# Patient Record
Sex: Female | Born: 1960 | Race: White | Hispanic: No | Smoking: Never smoker
Health system: Southern US, Community
[De-identification: ages and names within clinical notes are randomized; demographics above are authoritative.]

## PROBLEM LIST (undated history)

## (undated) DIAGNOSIS — F419 Anxiety disorder, unspecified: Secondary | ICD-10-CM

## (undated) DIAGNOSIS — R011 Cardiac murmur, unspecified: Secondary | ICD-10-CM

## (undated) HISTORY — PX: OTHER SURGICAL HISTORY: SHX169

## (undated) HISTORY — DX: Anxiety disorder, unspecified: F41.9

## (undated) HISTORY — PX: ABDOMINAL HYSTERECTOMY: SHX81

## (undated) HISTORY — PX: EYE SURGERY: SHX253

---

## 1998-06-17 ENCOUNTER — Other Ambulatory Visit: Admission: RE | Admit: 1998-06-17 | Discharge: 1998-06-17 | Payer: Self-pay | Admitting: Obstetrics & Gynecology

## 1998-07-22 ENCOUNTER — Inpatient Hospital Stay (HOSPITAL_COMMUNITY): Admission: RE | Admit: 1998-07-22 | Discharge: 1998-07-23 | Payer: Self-pay | Admitting: Obstetrics & Gynecology

## 2000-07-10 ENCOUNTER — Emergency Department (HOSPITAL_COMMUNITY): Admission: EM | Admit: 2000-07-10 | Discharge: 2000-07-10 | Payer: Self-pay | Admitting: Emergency Medicine

## 2000-07-10 ENCOUNTER — Encounter: Payer: Self-pay | Admitting: Emergency Medicine

## 2000-12-01 ENCOUNTER — Ambulatory Visit (HOSPITAL_COMMUNITY): Admission: RE | Admit: 2000-12-01 | Discharge: 2000-12-01 | Payer: Self-pay | Admitting: Orthopedic Surgery

## 2000-12-01 ENCOUNTER — Encounter: Payer: Self-pay | Admitting: Orthopedic Surgery

## 2000-12-14 ENCOUNTER — Encounter: Admission: RE | Admit: 2000-12-14 | Discharge: 2001-01-10 | Payer: Self-pay | Admitting: Orthopedic Surgery

## 2001-08-20 ENCOUNTER — Emergency Department (HOSPITAL_COMMUNITY): Admission: EM | Admit: 2001-08-20 | Discharge: 2001-08-20 | Payer: Self-pay | Admitting: Internal Medicine

## 2001-08-20 ENCOUNTER — Encounter: Payer: Self-pay | Admitting: Internal Medicine

## 2005-01-14 ENCOUNTER — Ambulatory Visit: Payer: Self-pay | Admitting: Cardiology

## 2005-06-14 ENCOUNTER — Emergency Department (HOSPITAL_COMMUNITY): Admission: EM | Admit: 2005-06-14 | Discharge: 2005-06-14 | Payer: Self-pay | Admitting: *Deleted

## 2006-08-18 ENCOUNTER — Ambulatory Visit (HOSPITAL_COMMUNITY): Admission: RE | Admit: 2006-08-18 | Discharge: 2006-08-18 | Payer: Self-pay | Admitting: Family Medicine

## 2009-04-18 ENCOUNTER — Ambulatory Visit (HOSPITAL_COMMUNITY): Admission: RE | Admit: 2009-04-18 | Discharge: 2009-04-18 | Payer: Self-pay | Admitting: Family Medicine

## 2009-12-25 ENCOUNTER — Ambulatory Visit (HOSPITAL_COMMUNITY): Admission: RE | Admit: 2009-12-25 | Discharge: 2009-12-25 | Payer: Self-pay | Admitting: Family Medicine

## 2009-12-29 ENCOUNTER — Emergency Department (HOSPITAL_COMMUNITY): Admission: EM | Admit: 2009-12-29 | Discharge: 2009-12-29 | Payer: Self-pay | Admitting: Emergency Medicine

## 2011-06-08 ENCOUNTER — Encounter: Payer: Self-pay | Admitting: Cardiology

## 2011-06-08 ENCOUNTER — Encounter: Payer: Self-pay | Admitting: *Deleted

## 2011-06-08 ENCOUNTER — Ambulatory Visit (INDEPENDENT_AMBULATORY_CARE_PROVIDER_SITE_OTHER): Payer: Self-pay | Admitting: Cardiology

## 2011-06-08 DIAGNOSIS — R079 Chest pain, unspecified: Secondary | ICD-10-CM

## 2011-06-08 DIAGNOSIS — F419 Anxiety disorder, unspecified: Secondary | ICD-10-CM | POA: Insufficient documentation

## 2011-06-08 DIAGNOSIS — R000 Tachycardia, unspecified: Secondary | ICD-10-CM

## 2011-06-08 HISTORY — DX: Chest pain, unspecified: R07.9

## 2011-06-08 NOTE — Assessment & Plan Note (Signed)
This certainly might play a role in her chest pain. We discussed exercise and its role in primary prevention as well as treatment of anxiety. She will follow with her primary provider.

## 2011-06-08 NOTE — Assessment & Plan Note (Signed)
This is very atypical. She has no significant risk factors. At this point I think the pretest probability of obstructive coronary disease is extremely low. No further testing would be indicated. I might suggest GI evaluation if this were to continue.

## 2011-06-08 NOTE — Patient Instructions (Signed)
   Follow up as needed. Your physician recommends that you continue on your current medications as directed. Please refer to the Current Medication list given to you today. 
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
## 2015-03-05 ENCOUNTER — Encounter: Payer: Self-pay | Admitting: Physician Assistant

## 2015-03-25 ENCOUNTER — Encounter: Payer: Self-pay | Admitting: Physician Assistant

## 2015-04-30 ENCOUNTER — Ambulatory Visit: Payer: Self-pay | Admitting: Physical Therapy

## 2015-06-05 ENCOUNTER — Ambulatory Visit: Payer: 59 | Attending: Physician Assistant | Admitting: Physical Therapy

## 2015-06-05 DIAGNOSIS — M545 Low back pain, unspecified: Secondary | ICD-10-CM

## 2015-06-05 DIAGNOSIS — M533 Sacrococcygeal disorders, not elsewhere classified: Secondary | ICD-10-CM | POA: Diagnosis not present

## 2015-06-05 DIAGNOSIS — M2560 Stiffness of unspecified joint, not elsewhere classified: Secondary | ICD-10-CM | POA: Insufficient documentation

## 2015-06-05 DIAGNOSIS — M5386 Other specified dorsopathies, lumbar region: Secondary | ICD-10-CM
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
## 2015-06-11 ENCOUNTER — Ambulatory Visit: Payer: 59 | Admitting: Physical Therapy

## 2015-06-11 ENCOUNTER — Encounter: Payer: Self-pay | Admitting: Physical Therapy

## 2015-06-11 DIAGNOSIS — M545 Low back pain, unspecified: Secondary | ICD-10-CM

## 2015-06-11 DIAGNOSIS — M5386 Other specified dorsopathies, lumbar region: Secondary | ICD-10-CM

## 2015-06-11 DIAGNOSIS — M533 Sacrococcygeal disorders, not elsewhere classified: Secondary | ICD-10-CM

## 2015-06-11 NOTE — Therapy (Signed)
[facility] [address] [place], [place], [address] Phone: [phone]   Fax:  [phone]  Physical Therapy Treatment  Patient Details  Name: Ashley Marquez MRN: [id number] Date of Birth: 1961/04/04 Referring Provider:  Samuel JesterButler, Cynthia, DO  Encounter Date: 06/11/2015      PT End of Session - 06/11/15 1605    Visit Number 2   Number of Visits 18   Date for PT Re-Evaluation 07/31/15   PT Start Time 1320   PT Stop Time 1401   PT Time Calculation (min) 41 min   Activity Tolerance Patient limited by pain   Behavior During Therapy [facility] for tasks assessed/performed      Past Medical History  Diagnosis Date  . Anxiety     Past Surgical History  Procedure Laterality Date  . None      There were no vitals filed for this visit.  Visit Diagnosis:  Right-sided low back pain without sciatica  Sacral pain  Decreased range of motion of lumbar spine      Subjective Assessment - 06/11/15 1604    Subjective States that she is in a lot of pain today. Cannot hardly sleep due to the pain. Has an hour drive today for grandson's ballgame and riding in a car increases her pain and it begins to come into the anterior hip. Pain was so bad that by the time she got to PT facility today she had to sit and stand to reduce the stiffness that she reports is normal after she rides or sits for prolonged period.   Limitations Sitting   Patient Stated Goals Get out of pain.   Currently in Pain? Yes   Pain Score 8    Pain Location Back   Pain Orientation Right   Pain Onset More than a month ago            [facility] - 06/11/15 0001    Assessment   Medical Diagnosis Lumbar spine strain.   Onset Date/Surgical Date 01/18/15                     [facility] Treatment/Exercise - 06/11/15 0001    Modalities   Modalities Electrical Stimulation;Ultrasound   Programme researcher, broadcasting/film/videolectrical Stimulation   Electrical Stimulation Location R SI  Joint   Electrical Stimulation Action Pre-Mod   Electrical Stimulation Parameters 80-150 Hz x15 min   Electrical Stimulation Goals Pain   Ultrasound   Ultrasound Location R SI joint   Ultrasound Parameters 1.5 w/cm2, 100%, 1 mhz    Ultrasound Goals Pain   Manual Therapy   Manual Therapy Myofascial release   Myofascial Release STW/TPR to R SI joint region, lumbar paraspinals, superior glute to decrease pain and tightness.                     PT Long Term Goals - 06/11/15 1606    PT LONG TERM GOAL #1   Title Ind with an HEP.   Time 6   Period Weeks   Status On-going   PT LONG TERM GOAL #2   Title Sit 30 minutes with pain not > 3/10.   Time 6   Period Weeks   Status On-going   PT LONG TERM GOAL #3   Title Drive 72-5345-60 minutes with pain not > 3/10.   Time 6   Period Weeks   Status On-going   PT LONG TERM GOAL #4   Title Perform ADL's with pain  not > 3/10.   Time 6   Period Weeks   Status On-going               Plan - 06/11/15 1608    Clinical Impression Statement Patient did fairly well during today's treatment but had verbalized tenderness during manual therapy. Normal modalties response noted following removal of the modalities. Notable tightness and verbalized tenderness in R lumbar paraspinals into R superior glute during manual therapy. Trigger points noted in superior glute and R lumbar paraspinals. Patient reported tenderness and 10/10 pain following treatment.   Pt will benefit from skilled therapeutic intervention in order to improve on the following deficits Pain;Decreased activity tolerance   Rehab Potential Excellent   PT Frequency 3x / week   PT Duration 6 weeks   PT Treatment/Interventions ADLs/Self Care Home Management;Electrical Stimulation;Cryotherapy;Ultrasound;Therapeutic activities;Therapeutic exercise;Patient/family education;Manual techniques   PT Next Visit Plan Modalities to right SIJ region.  STW/M to right sacral iliac ligament.   Core exrecises.  Please check leg lengths. Ask MPT regarding moist heat with e-stim next treatment.   Consulted and Agree with Plan of Care Patient        Problem List Patient Active Problem List   Diagnosis Date Noted  . Chest pain 06/08/2011  . Anxiety 06/08/2011    Evelene Croon, PTA 06/11/2015, 4:15 PM  [facility] [address] [place], [place], [address] Phone: [phone]   Fax:  [phone]

## 2015-06-13 ENCOUNTER — Encounter: Payer: Self-pay | Admitting: Physical Therapy

## 2015-06-18 ENCOUNTER — Ambulatory Visit: Payer: 59 | Admitting: *Deleted

## 2015-06-18 ENCOUNTER — Encounter: Payer: Self-pay | Admitting: *Deleted

## 2015-06-18 DIAGNOSIS — M533 Sacrococcygeal disorders, not elsewhere classified: Secondary | ICD-10-CM

## 2015-06-18 DIAGNOSIS — M5386 Other specified dorsopathies, lumbar region: Secondary | ICD-10-CM

## 2015-06-18 DIAGNOSIS — M545 Low back pain, unspecified: Secondary | ICD-10-CM

## 2015-06-18 NOTE — Therapy (Signed)
[facility] [address] [place], [place], [address] Phone: [phone]   Fax:  [phone]  Physical Therapy Treatment  Patient Details  Name: Ashley Marquez MRN: [id number] Date of Birth: 1961-04-02 Referring Provider:  Samuel JesterButler, Cynthia, DO  Encounter Date: 06/18/2015      PT End of Session - 06/18/15 1516    Visit Number 3   Number of Visits 18   Date for PT Re-Evaluation 07/31/15   PT Start Time 1430   PT Stop Time 1517   PT Time Calculation (min) 47 min      Past Medical History  Diagnosis Date  . Anxiety     Past Surgical History  Procedure Laterality Date  . None      There were no vitals filed for this visit.  Visit Diagnosis:  Right-sided low back pain without sciatica  Sacral pain  Decreased range of motion of lumbar spine      Subjective Assessment - 06/18/15 1446    Subjective States that she is in a lot of pain today. Cannot hardly sleep due to the pain. Has an hour drive today for grandson's ballgame and riding in a car increases her pain and it begins to come into the anterior hip. Pain was so bad that by the time she got to PT facility today she had to sit and stand to reduce the stiffness that she reports is normal after she rides or sits for prolonged period.   Limitations Sitting   How long can you sit comfortably? 20 minutes+.   Patient Stated Goals Get out of pain.   Currently in Pain? Yes   Pain Score 8    Pain Location Back   Pain Orientation Right   Pain Descriptors / Indicators Aching;Constant   Pain Onset More than a month ago   Pain Frequency Constant   Aggravating Factors  sitting, standing   Pain Relieving Factors nothing                         [facility] Adult PT Treatment/Exercise - 06/18/15 0001    Exercises   Exercises Lumbar   Modalities   Modalities Electrical Stimulation;Ultrasound   Electrical Stimulation   Electrical Stimulation Location R SI Joint   Electrical Stimulation Action PRemod x15 mins to RT LB paras and SIJ with pt LT sidelying   Electrical Stimulation Parameters 80-150hz    Electrical Stimulation Goals Pain   Ultrasound   Ultrasound Location RT SIJ   Ultrasound Parameters  1.5 w/cm2 x 10 mins   Ultrasound Goals Pain   Manual Therapy   Manual Therapy Myofascial release   Myofascial Release STW/TPR and IASTM to R SI joint region, lumbar paraspinals, superior glute to decrease pain and tightness.                     PT Long Term Goals - 06/11/15 1606    PT LONG TERM GOAL #1   Title Ind with an HEP.   Time 6   Period Weeks   Status On-going   PT LONG TERM GOAL #2   Title Sit 30 minutes with pain not > 3/10.   Time 6   Period Weeks   Status On-going   PT LONG TERM GOAL #3   Title Drive 69-6245-60 minutes with pain not > 3/10.   Time 6   Period Weeks   Status On-going   PT LONG TERM GOAL #4   Title  Perform ADL's with pain not > 3/10.   Time 6   Period Weeks   Status On-going               Plan - 06/18/15 1517    Clinical Impression Statement Pt did fair with Rx today and had a little relief with STW and modalities. She still had notable tightness and tendernesin RT SIJ area and along QL RT side. Pt tolerated light to moderate pressure with STW. Current goals are ongoing   Pt will benefit from skilled therapeutic intervention in order to improve on the following deficits Pain;Decreased activity tolerance   Rehab Potential Excellent   PT Frequency 3x / week   PT Duration 6 weeks   PT Treatment/Interventions ADLs/Self Care Home Management;Electrical Stimulation;Cryotherapy;Ultrasound;Therapeutic activities;Therapeutic exercise;Patient/family education;Manual techniques   PT Next Visit Plan Modalities to right SIJ region.  STW/M to right sacral iliac ligament.  Core exrecises.  Please check leg lengths. Ask MPT regarding moist heat with e-stim next treatment. TRY CAT/CAMEL for spine mobility if pt can  tolerate QP position   Consulted and Agree with Plan of Care Patient        Problem List Patient Active Problem List   Diagnosis Date Noted  . Chest pain 06/08/2011  . Anxiety 06/08/2011    Meadow Abramo,CHRIS, PTA 06/18/2015, 3:39 PM  [facility] [address] [place], [place], [address] Phone: [phone]   Fax:  [phone]

## 2015-06-24 ENCOUNTER — Ambulatory Visit: Payer: 59 | Admitting: Physical Therapy

## 2015-06-24 ENCOUNTER — Encounter: Payer: Self-pay | Admitting: Physical Therapy

## 2015-06-24 DIAGNOSIS — M5386 Other specified dorsopathies, lumbar region: Secondary | ICD-10-CM

## 2015-06-24 DIAGNOSIS — M545 Low back pain, unspecified: Secondary | ICD-10-CM

## 2015-06-24 DIAGNOSIS — M533 Sacrococcygeal disorders, not elsewhere classified: Secondary | ICD-10-CM

## 2015-06-24 NOTE — Therapy (Signed)
[facility] [address] [place], [place], [address] Phone: [phone]   Fax:  [phone]  Physical Therapy Treatment  Patient Details  Name: Ashley Marquez MRN: [id number] Date of Birth: Jan 27, 1961 Referring Provider:  Samuel Jester, DO  Encounter Date: 06/24/2015      PT End of Session - 06/24/15 1527    Visit Number 4   Number of Visits 18   Date for PT Re-Evaluation 07/31/15   PT Start Time 1430   PT Stop Time 1520   PT Time Calculation (min) 50 min      Past Medical History  Diagnosis Date  . Anxiety     Past Surgical History  Procedure Laterality Date  . None      There were no vitals filed for this visit.  Visit Diagnosis:  Right-sided low back pain without sciatica  Sacral pain  Decreased range of motion of lumbar spine      Subjective Assessment - 06/24/15 1437    Subjective States that she had a bad weekend with increased pain. Has used ice and heat over the weekend with no relief. Also had daughter to try and massage her back but had increased pain. Stated that pain got so bad at work today that she left at 12:30 pm.   Limitations Sitting   How long can you sit comfortably? 20 minutes+.   Patient Stated Goals Get out of pain.   Currently in Pain? Yes   Pain Score 9    Pain Location Back   Pain Orientation Right;Lower   Pain Descriptors / Indicators Tightness   Pain Onset More than a month ago            [facility] PT Assessment - 06/24/15 0001    Assessment   Medical Diagnosis Lumbar spine strain.   Onset Date/Surgical Date 01/18/15   Next MD Visit 07/03/2015  Dr. Drue [facility] Adult PT Treatment/Exercise - 06/24/15 0001    Modalities   Modalities Electrical Stimulation;Moist Heat;Ultrasound   Moist Heat Therapy   Number Minutes Moist Heat 15 Minutes   Moist Heat Location Lumbar Spine   Electrical Stimulation   Electrical Stimulation Location B low back    Electrical Stimulation Action Pre-Mod   Electrical Stimulation Parameters 80-150 hz x15 min   Electrical Stimulation Goals Pain   Ultrasound   Ultrasound Location R SI joint in L sidelying   Ultrasound Parameters 1.5 w/cm2, 100%, 1 mhz   Ultrasound Goals Pain   Manual Therapy   Manual Therapy Myofascial release   Myofascial Release Gentle STW to R lumbar paraspinals, Quadratus Lumborum, R SI joint in L sidelying to decrease pain and tightness                     PT Long Term Goals - 06/11/15 1606    PT LONG TERM GOAL #1   Title Ind with an HEP.   Time 6   Period Weeks   Status On-going   PT LONG TERM GOAL #2   Title Sit 30 minutes with pain not > 3/10.   Time 6   Period Weeks   Status On-going   PT LONG TERM GOAL #3   Title Drive 13-24 minutes with pain not > 3/10.   Time 6   Period Weeks   Status On-going   PT LONG TERM GOAL #4  Title Perform ADL's with pain not > 3/10.   Time 6   Period Weeks   Status On-going               Plan - 06/24/15 1448    Clinical Impression Statement Patient tolerated treatment fairly well today. MPT was consulted regarding use of moist heat with electrical stimulation and he approved use of moist heat. Normal modalities response noted following removal of the modalties. Minimal to moderate tightness noted in R lumbar paraspinals, Quadratus Lumborum, and around R SI joint. STW completed gently due to patient's increased pain and experienced tenderness around the R SI joint. Goals continue to be considered on-going secondary to increased pain. Experienced 6/10 pain following treatment.   Pt will benefit from skilled therapeutic intervention in order to improve on the following deficits Pain;Decreased activity tolerance   Rehab Potential Excellent   PT Frequency 3x / week   PT Duration 6 weeks   PT Treatment/Interventions ADLs/Self Care Home Management;Electrical Stimulation;Cryotherapy;Ultrasound;Therapeutic  activities;Therapeutic exercise;Patient/family education;Manual techniques   PT Next Visit Plan Modalities to right SIJ region.  STW/M to right sacral iliac ligament.  Core exrecises.  Please check leg lengths. Ask MPT regarding moist heat with e-stim next treatment. TRY CAT/CAMEL for spine mobility if pt can tolerate QP position. Communicate with MPT regarding lumbar traction.   Consulted and Agree with Plan of Care Patient        Problem List Patient Active Problem List   Diagnosis Date Noted  . Chest pain 06/08/2011  . Anxiety 06/08/2011   Florence Canner, PTA 06/24/2015 3:31 PM  [facility] [address] [place], [place], [address] Phone: [phone]   Fax:  [phone]

## 2015-07-01 ENCOUNTER — Encounter: Payer: Self-pay | Admitting: Physical Therapy

## 2015-07-01 ENCOUNTER — Ambulatory Visit: Payer: 59 | Attending: Physician Assistant | Admitting: Physical Therapy

## 2015-07-01 DIAGNOSIS — M533 Sacrococcygeal disorders, not elsewhere classified: Secondary | ICD-10-CM

## 2015-07-01 DIAGNOSIS — M545 Low back pain, unspecified: Secondary | ICD-10-CM

## 2015-07-01 DIAGNOSIS — M2560 Stiffness of unspecified joint, not elsewhere classified: Secondary | ICD-10-CM | POA: Diagnosis present

## 2015-07-01 DIAGNOSIS — M5386 Other specified dorsopathies, lumbar region: Secondary | ICD-10-CM

## 2015-07-01 NOTE — Therapy (Addendum)
[facility] [place], [place], [address] Phone: [phone]   Fax:  [phone]  Physical Therapy Treatment  Patient Details  Name: Ashley Marquez MRN: [id number] Date of Birth: 05-Jan-1961 Referring Provider:  Octavio Graves, DO  Encounter Date: 07/01/2015      PT End of Session - 07/01/15 1439    Visit Number 5   Number of Visits 18   Date for PT Re-Evaluation 07/31/15   PT Start Time 1435   PT Stop Time 4403   PT Time Calculation (min) 43 min   Activity Tolerance Patient limited by pain;Patient tolerated treatment well   Behavior During Therapy [facility] for tasks assessed/performed      Past Medical History  Diagnosis Date  . Anxiety     Past Surgical History  Procedure Laterality Date  . None      There were no vitals filed for this visit.  Visit Diagnosis:  Right-sided low back pain without sciatica  Sacral pain  Decreased range of motion of lumbar spine      Subjective Assessment - 07/01/15 1438    Subjective Reports that she has increased pain today. Has been sick this weekend and does not know if the increase in pain is from laying down over the weekend. Sees MD 07/03/2015. Has a beach trip coming up next week.   Limitations Sitting   How long can you sit comfortably? 20 minutes+.   Patient Stated Goals Get out of pain.   Currently in Pain? Yes   Pain Score 8    Pain Location Back   Pain Orientation Left;Lower   Pain Onset More than a month ago                                      PT Long Term Goals - 06/11/15 1606    PT LONG TERM GOAL #1   Title Ind with an HEP.   Time 6   Period Weeks   Status On-going   PT LONG TERM GOAL #2   Title Sit 30 minutes with pain not > 3/10.   Time 6   Period Weeks   Status On-going   PT LONG TERM GOAL #3   Title Drive 47-42 minutes with pain not > 3/10.   Time 6   Period Weeks   Status On-going   PT LONG TERM GOAL #4   Title  Perform ADL's with pain not > 3/10.   Time 6   Period Weeks   Status On-going               Plan - 07/01/15 1607    Clinical Impression Statement Patient tolerated treatment well today with some pain and tenderness experienced over the R SI joint region. Slightly moderate tightness noted in the R lumbar paraspinals, Quadratus Lumborum region during manual therapy with moderate pressure tolerated except over the R SI joint. Normal modalties response noted following removal of the modalities. No goals have been achieved at this time secondary to increased pain experienced by patient.   Pt will benefit from skilled therapeutic intervention in order to improve on the following deficits Pain;Decreased activity tolerance   Rehab Potential Excellent   PT Frequency 3x / week   PT Duration 6 weeks   PT Treatment/Interventions ADLs/Self Care Home Management;Electrical Stimulation;Cryotherapy;Ultrasound;Therapeutic activities;Therapeutic exercise;Patient/family education;Manual techniques   PT Next Visit Plan Modalities to right SIJ region.  STW/M to right sacral iliac ligament.  Core exrecises.  Please check leg lengths. TRY CAT/CAMEL for spine mobility if pt can tolerate QP position. Communicate with MPT regarding lumbar traction.   Consulted and Agree with Plan of Care Patient        Problem List Patient Active Problem List   Diagnosis Date Noted  . Chest pain 06/08/2011  . Anxiety 06/08/2011   Ahmed Prima, PTA 07/02/2015 10:27 AM  No notes on file  PHYSICAL THERAPY DISCHARGE SUMMARY  Visits from Start of Care: 5.  Current functional level related to goals / functional outcomes: See above.   Remaining deficits: Continued pain.   Education / Equipment: HEP. Plan: Patient agrees to discharge.  Patient goals were not met. Patient is being discharged due to                                                     ?????      Mali Applegate MPT  [facility] [place], [place], [address] Phone: [phone]   Fax:  [phone]

## 2015-12-05 ENCOUNTER — Emergency Department (HOSPITAL_COMMUNITY): Payer: BLUE CROSS/BLUE SHIELD

## 2015-12-05 ENCOUNTER — Encounter (HOSPITAL_COMMUNITY): Payer: Self-pay

## 2015-12-05 ENCOUNTER — Emergency Department (HOSPITAL_COMMUNITY)
Admission: EM | Admit: 2015-12-05 | Discharge: 2015-12-05 | Disposition: A | Discharge status: Home / Self Care | Payer: BLUE CROSS/BLUE SHIELD | Attending: Emergency Medicine | Admitting: Emergency Medicine

## 2015-12-05 DIAGNOSIS — R6883 Chills (without fever): Secondary | ICD-10-CM | POA: Diagnosis not present

## 2015-12-05 DIAGNOSIS — Z880 Allergy status to penicillin: Secondary | ICD-10-CM | POA: Insufficient documentation

## 2015-12-05 DIAGNOSIS — Z8659 Personal history of other mental and behavioral disorders: Secondary | ICD-10-CM | POA: Insufficient documentation

## 2015-12-05 DIAGNOSIS — R079 Chest pain, unspecified: Secondary | ICD-10-CM | POA: Diagnosis present

## 2015-12-05 DIAGNOSIS — R0789 Other chest pain: Secondary | ICD-10-CM | POA: Insufficient documentation

## 2015-12-05 DIAGNOSIS — R011 Cardiac murmur, unspecified: Secondary | ICD-10-CM | POA: Diagnosis not present

## 2015-12-05 DIAGNOSIS — R110 Nausea: Secondary | ICD-10-CM | POA: Diagnosis not present

## 2015-12-05 HISTORY — DX: Cardiac murmur, unspecified: R01.1

## 2015-12-05 LAB — CBC WITH DIFFERENTIAL/PLATELET
Basophils Absolute: 0 10*3/uL (ref 0.0–0.1)
Basophils Relative: 0 %
Eosinophils Absolute: 0.1 10*3/uL (ref 0.0–0.7)
Eosinophils Relative: 1 %
HCT: 37.3 % (ref 36.0–46.0)
Hemoglobin: 12.5 g/dL (ref 12.0–15.0)
Lymphocytes Relative: 20 %
Lymphs Abs: 1.2 10*3/uL (ref 0.7–4.0)
MCH: 31.1 pg (ref 26.0–34.0)
MCHC: 33.5 g/dL (ref 30.0–36.0)
MCV: 92.8 fL (ref 78.0–100.0)
Monocytes Absolute: 0.3 10*3/uL (ref 0.1–1.0)
Monocytes Relative: 6 %
Neutro Abs: 4.3 10*3/uL (ref 1.7–7.7)
Neutrophils Relative %: 73 %
Platelets: 306 10*3/uL (ref 150–400)
RBC: 4.02 MIL/uL (ref 3.87–5.11)
RDW: 12.4 % (ref 11.5–15.5)
WBC: 5.8 10*3/uL (ref 4.0–10.5)

## 2015-12-05 LAB — BASIC METABOLIC PANEL
Anion gap: 8 (ref 5–15)
BUN: 17 mg/dL (ref 6–20)
CO2: 25 mmol/L (ref 22–32)
Calcium: 9.2 mg/dL (ref 8.9–10.3)
Chloride: 108 mmol/L (ref 101–111)
Creatinine, Ser: 0.74 mg/dL (ref 0.44–1.00)
GFR calc Af Amer: >60 mL/min (ref >60)
GFR calc non Af Amer: >60 mL/min (ref >60)
Glucose, Bld: 106 mg/dL — ABNORMAL HIGH (ref 65–99)
Potassium: 3.9 mmol/L (ref 3.5–5.1)
Sodium: 141 mmol/L (ref 135–145)

## 2015-12-05 LAB — TROPONIN I
Troponin I: <0.03 ng/mL (ref <0.031)
Troponin I: <0.03 ng/mL (ref <0.031)

## 2015-12-05 NOTE — ED Provider Notes (Signed)
CSN: [id number]     Arrival date & time 12/05/15  0654 History   First MD Initiated Contact with Patient 12/05/15 0700     Chief Complaint  Patient presents with  . Chest Pain     (Consider location/radiation/quality/duration/timing/severity/associated sxs/prior Treatment) HPI Patient awakened today at 4 AM, feeling well. At 5 AM she developed sharp pain at her right scapula. Pain was not made better or worse by anything. Symptoms were coming by chills. No cough and no other associated symptoms. Pain and suddenly at 5:50 AM when suddenly it radiated around to her anterior chest and ended with "an electric shock", at her anterior chest, lasting a split second. Patient also reports intermittent nausea for the past 3 or 4 days. She is presently asymptomatic. She treated herself with ibuprofen. She called EMS and EMS advised her to take 4 baby aspirins which she did She's never had chest pain with exertion. Past Medical History  Diagnosis Date  . Anxiety   . Heart murmur     Past Surgical History  Procedure Laterality Date  . None    . Abdominal hysterectomy     Family History  Problem Relation Age of Onset  . Emphysema Father 3874   Social History  Substance Use Topics  . Smoking status: Never Smoker   . Smokeless tobacco: Never Used  . Alcohol Use: None   no illicit drug use OB History    No data available     Review of Systems  Constitutional: Positive for chills.  HENT: Negative.   Respiratory: Negative.   Cardiovascular: Positive for chest pain.  Gastrointestinal: Positive for nausea.  Musculoskeletal: Negative.   Skin: Negative.   Neurological: Negative.   Psychiatric/Behavioral: Negative.   All other systems reviewed and are negative.     Allergies  Penicillins  Home Medications   Prior to Admission medications   Medication Sig Start Date End Date Taking? Authorizing Provider  ibuprofen (ADVIL,MOTRIN) 200 MG tablet Take 200 mg by mouth every 4 (four) hours as  needed for moderate pain.    Historical Provider, MD   BP 152/85 mmHg  Pulse 87  Temp(Src) 98.2 F (36.8 C) (Oral)  Resp 20  Ht 5\' 4"  (1.626 m)  Wt 132 lb (59.875 kg)  BMI 22.65 kg/m2  SpO2 100% Physical Exam  Constitutional: She appears well-developed and well-nourished.  HENT:  Head: Normocephalic and atraumatic.  Eyes: Conjunctivae are normal. Pupils are equal, round, and reactive to light.  Neck: Neck supple. No tracheal deviation present. No thyromegaly present.  Cardiovascular: Normal rate and regular rhythm.   No murmur heard. Pulmonary/Chest: Effort normal and breath sounds normal.  Abdominal: Soft. Bowel sounds are normal. She exhibits no distension. There is no tenderness.  Musculoskeletal: Normal range of motion. She exhibits no edema or tenderness.  Neurological: She is alert. Coordination normal.  Skin: Skin is warm and dry. No rash noted.  Psychiatric: She has a normal mood and affect.  Nursing note and vitals reviewed.   ED Course  Procedures (including critical care time) Labs Review Labs Reviewed - No data to display  Imaging Review No results found. I have personally reviewed and evaluated these images and lab results as part of my medical decision-making.   EKG Interpretation None     ED ECG REPORT   Date: 12/05/2015  Rate: 90  Rhythm: normal sinus rhythm  QRS Axis: normal  Intervals: normal  ST/T Wave abnormalities: nonspecific T wave changes  Conduction Disutrbances:none  Narrative  Interpretation:   Old EKG Reviewed: No significant change over 07/10/2000  I have personally reviewed the EKG tracing and agree with the computerized printout as noted. 10:45 AM patient asymptomatic Chest x-ray viewed by me Results for orders placed or performed during the hospital encounter of 12/05/15  Troponin I  Result Value Ref Range   Troponin I <0.03 <0.031 ng/mL  Troponin I  Result Value Ref Range   Troponin I <0.03 <0.031 ng/mL  Basic metabolic  panel  Result Value Ref Range   Sodium 141 135 - 145 mmol/L   Potassium 3.9 3.5 - 5.1 mmol/L   Chloride 108 101 - 111 mmol/L   CO2 25 22 - 32 mmol/L   Glucose, Bld 106 (H) 65 - 99 mg/dL   BUN 17 6 - 20 mg/dL   Creatinine, Ser 9.14 0.44 - 1.00 mg/dL   Calcium 9.2 8.9 - 78.2 mg/dL   GFR calc non Af Amer >60 >60 mL/min   GFR calc Af Amer >60 >60 mL/min   Anion gap 8 5 - 15  CBC with Differential/Platelet  Result Value Ref Range   WBC 5.8 4.0 - 10.5 K/uL   RBC 4.02 3.87 - 5.11 MIL/uL   Hemoglobin 12.5 12.0 - 15.0 g/dL   HCT 95.6 21.3 - 08.6 %   MCV 92.8 78.0 - 100.0 fL   MCH 31.1 26.0 - 34.0 pg   MCHC 33.5 30.0 - 36.0 g/dL   RDW 57.8 46.9 - 62.9 %   Platelets 306 150 - 400 K/uL   Neutrophils Relative % 73 %   Neutro Abs 4.3 1.7 - 7.7 K/uL   Lymphocytes Relative 20 %   Lymphs Abs 1.2 0.7 - 4.0 K/uL   Monocytes Relative 6 %   Monocytes Absolute 0.3 0.1 - 1.0 K/uL   Eosinophils Relative 1 %   Eosinophils Absolute 0.1 0.0 - 0.7 K/uL   Basophils Relative 0 %   Basophils Absolute 0.0 0.0 - 0.1 K/uL   Dg Chest 2 View  12/05/2015  CLINICAL DATA:  Chest pain EXAM: CHEST  2 VIEW COMPARISON:  12/29/2009 FINDINGS: The heart size and mediastinal contours are within normal limits. Both lungs are clear. The visualized skeletal structures are unremarkable. Apical scarring bilaterally unchanged. IMPRESSION: No active cardiopulmonary disease. Electronically Signed   By: Marlan Palau M.D.   On: 12/05/2015 07:54    MDM  Heart score =2 based on age, ekg criteria.cardiac risk factors -none patient suitable for outpatient cardiac evaluation Diagnosis atypical chest pain Final diagnoses:  None   plan follow-up with PMD       Doug Sou, MD 12/05/15 1129

## 2015-12-05 NOTE — ED Notes (Addendum)
Pt reports woke up at 4am and felt fine.  Reports got upset with her grandson this morning because he didn't want to get up.  Pt says after getting upset she started having pain in r shoulder blade.  Pt says while driving, had a sharp pain in chest, cold chills, and nauseated.  Pt took 2 ibuprofen and called ems.  EMS came to pt's house and advised her to take 4 baby asa.

## 2015-12-05 NOTE — ED Notes (Signed)
Dr. J at bedside 

## 2015-12-05 NOTE — Discharge Instructions (Signed)
Nonspecific Chest Pain Call your primary care physician today to schedule follow-up appointment for within the next few weeks. You can discuss with your primary care physician the possibility of an exercise stress test to check at your heart further as an outpatient. Return if your condition worsens for any reason. It is often hard to find the cause of chest pain. There is always a chance that your pain could be related to something serious, such as a heart attack or a blood clot in your lungs. Chest pain can also be caused by conditions that are not life-threatening. If you have chest pain, it is very important to follow up with your doctor.  HOME CARE  If you were prescribed an antibiotic medicine, finish it all even if you start to feel better.  Avoid any activities that cause chest pain.  Do not use any tobacco products, including cigarettes, chewing tobacco, or electronic cigarettes. If you need help quitting, ask your doctor.  Do not drink alcohol.  Take medicines only as told by your doctor.  Keep all follow-up visits as told by your doctor. This is important. This includes any further testing if your chest pain does not go away.  Your doctor may tell you to keep your head raised (elevated) while you sleep.  Make lifestyle changes as told by your doctor. These may include:  Getting regular exercise. Ask your doctor to suggest some activities that are safe for you.  Eating a heart-healthy diet. Your doctor or a diet specialist (dietitian) can help you to learn healthy eating options.  Maintaining a healthy weight.  Managing diabetes, if necessary.  Reducing stress. GET HELP IF:  Your chest pain does not go away, even after treatment.  You have a rash with blisters on your chest.  You have a fever. GET HELP RIGHT AWAY IF:  Your chest pain is worse.  You have an increasing cough, or you cough up blood.  You have severe belly (abdominal) pain.  You feel extremely  weak.  You pass out (faint).  You have chills.  You have sudden, unexplained chest discomfort.  You have sudden, unexplained discomfort in your arms, back, neck, or jaw.  You have shortness of breath at any time.  You suddenly start to sweat, or your skin gets clammy.  You feel nauseous.  You vomit.  You suddenly feel light-headed or dizzy.  Your heart begins to beat quickly, or it feels like it is skipping beats. These symptoms may be an emergency. Do not wait to see if the symptoms will go away. Get medical help right away. Call your local emergency services (911 in the [place] Do not drive yourself to the hospital.   This information is not intended to replace advice given to you by your health care provider. Make sure you discuss any questions you have with your health care provider.   Document Released: 05/04/2008 Document Revised: 12/07/2014 Document Reviewed: 06/22/2014 Elsevier Interactive Patient Education [employer]2016 Elsevier Inc.

## 2015-12-05 NOTE — ED Notes (Signed)
Pt made aware to return if symptoms worsen or if any life threatening symptoms occur.   

## 2016-09-15 ENCOUNTER — Encounter: Payer: Self-pay | Admitting: Physician Assistant

## 2016-09-15 ENCOUNTER — Ambulatory Visit (INDEPENDENT_AMBULATORY_CARE_PROVIDER_SITE_OTHER): Payer: BLUE CROSS/BLUE SHIELD | Admitting: Physician Assistant

## 2016-09-15 VITALS — BP 133/86 | HR 62 | Temp 97.3°F | Ht 64.0 in | Wt 131.2 lb

## 2016-09-15 DIAGNOSIS — R030 Elevated blood-pressure reading, without diagnosis of hypertension: Secondary | ICD-10-CM | POA: Diagnosis not present

## 2016-09-15 DIAGNOSIS — F411 Generalized anxiety disorder: Secondary | ICD-10-CM | POA: Insufficient documentation

## 2016-09-15 DIAGNOSIS — G8929 Other chronic pain: Secondary | ICD-10-CM | POA: Diagnosis not present

## 2016-09-15 DIAGNOSIS — M545 Low back pain: Secondary | ICD-10-CM

## 2016-09-15 DIAGNOSIS — F419 Anxiety disorder, unspecified: Secondary | ICD-10-CM | POA: Diagnosis not present

## 2016-09-15 MED ORDER — CITALOPRAM HYDROBROMIDE 20 MG PO TABS: 20.0000 mg | ORAL_TABLET | Freq: Every day | ORAL | 5 refills | Status: DC

## 2016-09-15 MED ORDER — OXYCODONE-ACETAMINOPHEN 10-325 MG PO TABS: 1.0000 | ORAL_TABLET | Freq: Four times a day (QID) | ORAL | 0 refills | Status: DC | PRN

## 2016-09-15 MED ORDER — LISINOPRIL 5 MG PO TABS: 5.0000 mg | ORAL_TABLET | Freq: Every day | ORAL | 3 refills | Status: DC

## 2016-09-15 MED ORDER — MELOXICAM 15 MG PO TABS: 15.0000 mg | ORAL_TABLET | Freq: Every day | ORAL | 0 refills | Status: DC

## 2016-09-15 MED ORDER — GABAPENTIN 100 MG PO CAPS: 100.0000 mg | ORAL_CAPSULE | Freq: Three times a day (TID) | ORAL | 3 refills | Status: DC
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
## 2016-09-15 NOTE — Patient Instructions (Signed)
Hypertension Hypertension, commonly called high blood pressure, is when the force of blood pumping through your arteries is too strong. Your arteries are the blood vessels that carry blood from your heart throughout your body. A blood pressure reading consists of a higher number over a lower number, such as 110/72. The higher number (systolic) is the pressure inside your arteries when your heart pumps. The lower number (diastolic) is the pressure inside your arteries when your heart relaxes. Ideally you want your blood pressure below 120/80. Hypertension forces your heart to work harder to pump blood. Your arteries may become narrow or stiff. Having untreated or uncontrolled hypertension can cause heart attack, stroke, kidney disease, and other problems. RISK FACTORS Some risk factors for high blood pressure are controllable. Others are not.  Risk factors you cannot control include:   Race. You may be at higher risk if you are African American.  Age. Risk increases with age.  Gender. Men are at higher risk than women before age 45 years. After age 65, women are at higher risk than men. Risk factors you can control include:  Not getting enough exercise or physical activity.  Being overweight.  Getting too much fat, sugar, calories, or salt in your diet.  Drinking too much alcohol. SIGNS AND SYMPTOMS Hypertension does not usually cause signs or symptoms. Extremely high blood pressure (hypertensive crisis) may cause headache, anxiety, shortness of breath, and nosebleed. DIAGNOSIS To check if you have hypertension, your health care provider will measure your blood pressure while you are seated, with your arm held at the level of your heart. It should be measured at least twice using the same arm. Certain conditions can cause a difference in blood pressure between your right and left arms. A blood pressure reading that is higher than normal on one occasion does not mean that you need treatment. If  it is not clear whether you have high blood pressure, you may be asked to return on a different day to have your blood pressure checked again. Or, you may be asked to monitor your blood pressure at home for 1 or more weeks. TREATMENT Treating high blood pressure includes making lifestyle changes and possibly taking medicine. Living a healthy lifestyle can help lower high blood pressure. You may need to change some of your habits. Lifestyle changes may include:  Following the DASH diet. This diet is high in fruits, vegetables, and whole grains. It is low in salt, red meat, and added sugars.  Keep your sodium intake below 2,300 mg per day.  Getting at least 30-45 minutes of aerobic exercise at least 4 times per week.  Losing weight if necessary.  Not smoking.  Limiting alcoholic beverages.  Learning ways to reduce stress. Your health care provider may prescribe medicine if lifestyle changes are not enough to get your blood pressure under control, and if one of the following is true:  You are 18-59 years of age and your systolic blood pressure is above 140.  You are 60 years of age or older, and your systolic blood pressure is above 150.  Your diastolic blood pressure is above 90.  You have diabetes, and your systolic blood pressure is over 140 or your diastolic blood pressure is over 90.  You have kidney disease and your blood pressure is above 140/90.  You have heart disease and your blood pressure is above 140/90. Your personal target blood pressure may vary depending on your medical conditions, your age, and other factors. HOME CARE INSTRUCTIONS    Have your blood pressure rechecked as directed by your health care provider.   Take medicines only as directed by your health care provider. Follow the directions carefully. Blood pressure medicines must be taken as prescribed. The medicine does not work as well when you skip doses. Skipping doses also puts you at risk for  problems.  Do not smoke.   Monitor your blood pressure at home as directed by your health care provider. SEEK MEDICAL CARE IF:   You think you are having a reaction to medicines taken.  You have recurrent headaches or feel dizzy.  You have swelling in your ankles.  You have trouble with your vision. SEEK IMMEDIATE MEDICAL CARE IF:  You develop a severe headache or confusion.  You have unusual weakness, numbness, or feel faint.  You have severe chest or abdominal pain.  You vomit repeatedly.  You have trouble breathing. MAKE SURE YOU:   Understand these instructions.  Will watch your condition.  Will get help right away if you are not doing well or get worse.   This information is not intended to replace advice given to you by your health care provider. Make sure you discuss any questions you have with your health care provider.   Document Released: 11/16/2005 Document Revised: 04/02/2015 Document Reviewed: 09/08/2013 Elsevier Interactive Patient Education 2016 [employer] Inc.  

## 2016-10-13 ENCOUNTER — Ambulatory Visit (INDEPENDENT_AMBULATORY_CARE_PROVIDER_SITE_OTHER): Payer: BLUE CROSS/BLUE SHIELD | Admitting: Physician Assistant

## 2016-10-13 VITALS — BP 133/78 | HR 68 | Temp 96.6°F | Ht 64.0 in | Wt 133.8 lb

## 2016-10-13 DIAGNOSIS — F419 Anxiety disorder, unspecified: Secondary | ICD-10-CM

## 2016-10-13 DIAGNOSIS — M545 Low back pain, unspecified: Secondary | ICD-10-CM

## 2016-10-13 DIAGNOSIS — G8929 Other chronic pain: Secondary | ICD-10-CM

## 2016-10-13 DIAGNOSIS — F411 Generalized anxiety disorder: Secondary | ICD-10-CM | POA: Diagnosis not present

## 2016-10-13 DIAGNOSIS — J0100 Acute maxillary sinusitis, unspecified: Secondary | ICD-10-CM | POA: Diagnosis not present

## 2016-10-13 DIAGNOSIS — R030 Elevated blood-pressure reading, without diagnosis of hypertension: Secondary | ICD-10-CM

## 2016-10-13 MED ORDER — OXYCODONE-ACETAMINOPHEN 10-325 MG PO TABS: 1.0000 | ORAL_TABLET | ORAL | 0 refills | Status: DC | PRN

## 2016-10-13 MED ORDER — OXYCODONE-ACETAMINOPHEN 10-325 MG PO TABS: 1.0000 | ORAL_TABLET | Freq: Four times a day (QID) | ORAL | 0 refills | Status: DC | PRN

## 2016-10-13 MED ORDER — PREDNISONE 10 MG (21) PO TBPK: ORAL_TABLET | ORAL | 0 refills | Status: DC

## 2016-10-13 MED ORDER — AZITHROMYCIN 250 MG PO TABS: ORAL_TABLET | ORAL | 0 refills | Status: DC

## 2016-10-13 NOTE — Progress Notes (Signed)
BP 133/78   Pulse 68   Temp (!) 96.6 F (35.9 C) (Oral)   Ht 5\' 4"  (1.626 m)   Wt 133 lb 12.8 oz (60.7 kg)   BMI 22.97 kg/m    Subjective:    Patient ID: Ashley Marquez, female    DOB: Sep 07, 1961, 55 y.o.   MRN: [id number]  HPI: Ashley Marquez is a 55 y.o. female presenting on 10/13/2016 for Hypertension (4 week rck ); Ear Pain; and head congestion  Blood pressure has been better and will need refills. Has also had cough and congestion for several days.  Denies fever and chills.  No NVD. Sinus pressure and post nasal drainage. Cough is severe at night. Pain visit is also updated. Record is below note.  Relevant past medical, surgical, family and social history reviewed and updated as indicated. Allergies and medications reviewed and updated.  Past Medical History:  Diagnosis Date  . Anxiety   . Heart murmur     Past Surgical History:  Procedure Laterality Date  . ABDOMINAL HYSTERECTOMY    . None      Review of Systems  Constitutional: Negative.   HENT: Positive for congestion, postnasal drip and sore throat.   Eyes: Negative.   Respiratory: Positive for cough and wheezing.   Gastrointestinal: Negative.   Genitourinary: Negative.   Musculoskeletal: Positive for arthralgias, back pain, gait problem and myalgias.      Medication List       Accurate as of 10/13/16 11:59 PM. Always use your most recent med list.          azithromycin 250 MG tablet Commonly known as:  ZITHROMAX Z-PAK As directed   citalopram 20 MG tablet Commonly known as:  CELEXA Take 1 tablet (20 mg total) by mouth daily.   ibuprofen 200 MG tablet Commonly known as:  ADVIL,MOTRIN Take 400 mg by mouth every 4 (four) hours as needed for moderate pain.   lisinopril 5 MG tablet Commonly known as:  PRINIVIL,ZESTRIL Take 1 tablet (5 mg total) by mouth daily.   oxyCODONE-acetaminophen 10-325 MG tablet Commonly known as:  PERCOCET Take 1 tablet by mouth every 6 (six) hours as needed.     oxyCODONE-acetaminophen 10-325 MG tablet Commonly known as:  PERCOCET Take 1 tablet by mouth every 4 (four) hours as needed for pain.   oxyCODONE-acetaminophen 10-325 MG tablet Commonly known as:  PERCOCET Take 1 tablet by mouth every 4 (four) hours as needed for pain.   predniSONE 10 MG (21) Tbpk tablet Commonly known as:  STERAPRED UNI-PAK 21 TAB As directed x 6 days          Objective:    BP 133/78   Pulse 68   Temp (!) 96.6 F (35.9 C) (Oral)   Ht 5\' 4"  (1.626 m)   Wt 133 lb 12.8 oz (60.7 kg)   BMI 22.97 kg/m   Allergies  Allergen Reactions  . Penicillins Nausea And Vomiting and Other (See Comments)    Patient slept for 2 days.  Has patient had a PCN reaction causing immediate rash, facial/tongue/throat swelling, SOB or lightheadedness with hypotension: No Has patient had a PCN reaction causing severe rash involving mucus membranes or skin necrosis: No Has patient had a PCN reaction that required hospitalization No Has patient had a PCN reaction occurring within the last 10 years: No If all of the above answers are "NO", then may proceed with Cephalosporin use.     Physical Exam  Constitutional: She is  oriented to person, place, and time. She appears well-developed and well-nourished.  HENT:  Head: Normocephalic and atraumatic.  Right Ear: Tympanic membrane and external ear normal. No middle ear effusion.  Left Ear: Tympanic membrane and external ear normal.  No middle ear effusion.  Nose: Mucosal edema and rhinorrhea present. Right sinus exhibits no maxillary sinus tenderness. Left sinus exhibits no maxillary sinus tenderness.  Mouth/Throat: Uvula is midline. Posterior oropharyngeal erythema present.  Eyes: Conjunctivae and EOM are normal. Pupils are equal, round, and reactive to light. Right eye exhibits no discharge. Left eye exhibits no discharge.  Neck: Normal range of motion.  Cardiovascular: Normal rate, regular rhythm, normal heart sounds and intact  distal pulses.   Pulmonary/Chest: Effort normal and breath sounds normal. No respiratory distress. She has no wheezes.  Abdominal: Soft. Bowel sounds are normal.  Musculoskeletal:       Lumbar back: She exhibits decreased range of motion, tenderness, pain and spasm.  Lymphadenopathy:    She has no cervical adenopathy.  Neurological: She is alert and oriented to person, place, and time. She has normal reflexes.  Skin: Skin is warm and dry. No rash noted.  Psychiatric: She has a normal mood and affect. Her behavior is normal. Judgment and thought content normal.  Nursing note and vitals reviewed.       Assessment & Plan:   1. Elevated BP without diagnosis of hypertension  2. Generalized anxiety disorder  3. Anxiety  4. Acute non-recurrent maxillary sinusitis - azithromycin (ZITHROMAX Z-PAK) 250 MG tablet; As directed  Dispense: 6 tablet; Refill: 0  5. Chronic right-sided low back pain without sciatica - oxyCODONE-acetaminophen (PERCOCET) 10-325 MG tablet; Take 1 tablet by mouth every 6 (six) hours as needed.  Dispense: 150 tablet; Refill: 0 - oxyCODONE-acetaminophen (PERCOCET) 10-325 MG tablet; Take 1 tablet by mouth every 4 (four) hours as needed for pain.  Dispense: 150 tablet; Refill: 0 - oxyCODONE-acetaminophen (PERCOCET) 10-325 MG tablet; Take 1 tablet by mouth every 4 (four) hours as needed for pain.  Dispense: 150 tablet; Refill: 0   Continue all other maintenance medications as listed above.  Follow up plan: Return in about 3 months (around 01/13/2017) for recheck.  No orders of the defined types were placed in this encounter.   Educational handout given for sinusitis  Remus LofflerAngel S. Sabeen Piechocki PA-C [facility] [address]  [place], [place] [id number] [phone]   10/15/2016, 8:46 PM  [facility] reviewed- Yes If yes- were their any concerning findings : no [facility] screen [facility] 10/13/2016 09/15/2016    Decreased Interest 0 0  Down, Depressed, Hopeless 0 0  PHQ - 2 Score 0 0    No flowsheet data found.   [employer] drug screen performed- No  SOAPP 0= never  1= seldom  2=sometimes  3= often  4= very often  1. How often do you have mood swings? 0 2. How often do you smoke a cigarette within an hour after waling up? 0 3. How often have you taken medication other than the way that it was prescribed?0 4. How often have you used illegal drugs in the past 5 years? 0 5. How often, in your lifetime, have you had legal problems or been arrested? 0  Score 0   Alcohol Audit - How often during the last year have found that you: 0-Never   1- Less than monthly   2- Monthly     3-Weekly     4-daily or  almost daily  1. found that you were not able to stop drinking once you started- 0 2. failed to do what was normally expected of you because of drinking- 0 3. needed a first drink in the morning- 0 4. had a feeling of guilt or remorse after drinking- 0 5. are/were unable to remember what happened the night before because of your drinking- 0  0- NO   2- yes but not in last year  4- yes during last year 6. Have you or someone else been injured because of your drinking- 0 7. Has anyone been concerned about your drinking or suggested you cut down- 0        TOTAL- 0   ( 0-7- alcohol education, 8-15- simple advice, 16-19 simple advice plus counseling, 20-40 referral for evaluation and treatment 0   [facility]  Pain assessment: Cause of pain- DDD and lumbar injury Pain location- lumbar Pain on scale of 1-10- 8 Frequency- daily What increases pain-work What makes pain Better-rest Effects on ADL - mild  Prior treatments tried and failed- PT, injections, NSAIDs, gabapentin Current treatments- percocet 10/325 1 tab 3-5 times daily Morphine mg equivalent- 45-75 mg  Pain management agreement reviewed and signed- No will do at next visit, was aldo sick today and was  having BP rechecked.

## 2016-10-13 NOTE — Patient Instructions (Signed)

## 2016-10-20 ENCOUNTER — Telehealth: Payer: Self-pay | Admitting: Physician Assistant

## 2016-10-20 MED ORDER — CLARITHROMYCIN 250 MG PO TABS: 250.0000 mg | ORAL_TABLET | Freq: Two times a day (BID) | ORAL | 0 refills | Status: DC

## 2016-10-20 NOTE — Telephone Encounter (Signed)
Med sent.

## 2016-10-21 ENCOUNTER — Ambulatory Visit: Payer: BLUE CROSS/BLUE SHIELD | Admitting: Physician Assistant

## 2016-11-28 ENCOUNTER — Other Ambulatory Visit: Payer: Self-pay | Admitting: Physician Assistant

## 2016-12-29 ENCOUNTER — Telehealth: Payer: Self-pay | Admitting: Physician Assistant

## 2016-12-29 MED ORDER — LEVOFLOXACIN 500 MG PO TABS: 500.0000 mg | ORAL_TABLET | Freq: Every day | ORAL | 0 refills | Status: DC

## 2016-12-29 NOTE — Telephone Encounter (Signed)
Prescription sent to pharmacy.

## 2016-12-29 NOTE — Telephone Encounter (Signed)
Pt aware Rx sent to pharmacy 

## 2017-01-13 ENCOUNTER — Encounter: Payer: Self-pay | Admitting: Physician Assistant

## 2017-01-13 ENCOUNTER — Ambulatory Visit (INDEPENDENT_AMBULATORY_CARE_PROVIDER_SITE_OTHER): Payer: BLUE CROSS/BLUE SHIELD | Admitting: Physician Assistant

## 2017-01-13 VITALS — BP 135/74 | HR 69 | Temp 97.0°F | Ht 64.0 in | Wt 135.6 lb

## 2017-01-13 DIAGNOSIS — F411 Generalized anxiety disorder: Secondary | ICD-10-CM | POA: Diagnosis not present

## 2017-01-13 DIAGNOSIS — G8929 Other chronic pain: Secondary | ICD-10-CM

## 2017-01-13 DIAGNOSIS — M545 Low back pain: Secondary | ICD-10-CM

## 2017-01-13 MED ORDER — OXYCODONE-ACETAMINOPHEN 10-325 MG PO TABS: 1.0000 | ORAL_TABLET | ORAL | 0 refills | Status: DC | PRN

## 2017-01-13 MED ORDER — CITALOPRAM HYDROBROMIDE 40 MG PO TABS: 40.0000 mg | ORAL_TABLET | Freq: Every day | ORAL | 6 refills | Status: DC

## 2017-01-13 MED ORDER — OXYCODONE-ACETAMINOPHEN 10-325 MG PO TABS: 1.0000 | ORAL_TABLET | Freq: Four times a day (QID) | ORAL | 0 refills | Status: DC | PRN

## 2017-01-13 NOTE — Patient Instructions (Signed)
Degenerative Disk Disease Introduction Degenerative disk disease is a condition caused by the changes that occur in spinal disks as you grow older. Spinal disks are soft and compressible disks located between the bones of your spine (vertebrae). These disks act like shock absorbers. Degenerative disk disease can affect the whole spine. However, the neck and lower back are most commonly affected. Many changes can occur in the spinal disks with aging, such as:  The spinal disks may dry and shrink.  Small tears may occur in the tough, outer covering of the disk (annulus).  The disk space may become smaller due to loss of water.  Abnormal growths in the bone (spurs) may occur. This can put pressure on the nerve roots exiting the spinal canal, causing pain.  The spinal canal may become narrowed. What increases the risk?  Being overweight.  Having a family history of degenerative disk disease.  Smoking.  There is increased risk if you are doing heavy lifting or have a sudden injury. What are the signs or symptoms? Symptoms vary from person to person and may include:  Pain that varies in intensity. Some people have no pain, while others have severe pain. The location of the pain depends on the part of your backbone that is affected.  You will have neck or arm pain if a disk in the neck area is affected.  You will have pain in your back, buttocks, or legs if a disk in the lower back is affected.  Pain that becomes worse while bending, reaching up, or with twisting movements.  Pain that may start gradually and then get worse as time passes. It may also start after a major or minor injury.  Numbness or tingling in the arms or legs. How is this diagnosed? Your health care provider will ask you about your symptoms and about activities or habits that may cause the pain. He or she may also ask about any injuries, diseases, or treatments you have had. Your health care provider will examine you  to check for the range of movement that is possible in the affected area, to check for strength in your extremities, and to check for sensation in the areas of the arms and legs supplied by different nerve roots. You may also have:  An X-ray of the spine.  Other imaging tests, such as MRI. How is this treated? Your health care provider will advise you on the best plan for treatment. Treatment may include:  Medicines.  Rehabilitation exercises. Follow these instructions at home:  Follow proper lifting and walking techniques as advised by your health care provider.  Maintain good posture.  Exercise regularly as advised by your health care provider.  Perform relaxation exercises.  Change your sitting, standing, and sleeping habits as advised by your health care provider.  Change positions frequently.  Lose weight or maintain a healthy weight as advised by your health care provider.  Do not use any tobacco products, including cigarettes, chewing tobacco, or electronic cigarettes. If you need help quitting, ask your health care provider.  Wear supportive footwear.  Take medicines only as directed by your health care provider. Contact a health care provider if:  Your pain does not go away within 1-4 weeks.  You have significant appetite or weight loss. Get help right away if:  Your pain is severe.  You notice weakness in your arms, hands, or legs.  You begin to lose control of your bladder or bowel movements.  You have fevers or night   sweats. This information is not intended to replace advice given to you by your health care provider. Make sure you discuss any questions you have with your health care provider. Document Released: 09/13/2007 Document Revised: 04/23/2016 Document Reviewed: 03/20/2014  2017 Elsevier  

## 2017-01-13 NOTE — Progress Notes (Signed)
BP 135/74   Pulse 69   Temp 97 F (36.1 C) (Oral)   Ht 5\' 4"  (1.626 m)   Wt 135 lb 9.6 oz (61.5 kg)   BMI 23.28 kg/m    Subjective:    Patient ID: Ashley Marquez, female    DOB: [birth year], 56 y.o.   MRN: [id number]  HPI: Ashley Marquez is a 56 y.o. female presenting on 01/13/2017 for Hypertension and Back Pain  This patient comes in for periodic recheck on medications and conditions. All medications are reviewed today. There are no reports of any problems with the medications. All of the medical conditions are reviewed and updated.  Lab work is reviewed and will be ordered as medically necessary. There are no new problems reported with today's visit.  Stable back but worsening anxiety related to family stressors.  Relevant past medical, surgical, family and social history reviewed and updated as indicated. Allergies and medications reviewed and updated.  Past Medical History:  Diagnosis Date  . Anxiety   . Heart murmur     Past Surgical History:  Procedure Laterality Date  . ABDOMINAL HYSTERECTOMY    . None      Review of Systems  Constitutional: Negative.  Negative for activity change, fatigue and fever.  HENT: Negative.   Eyes: Negative.   Respiratory: Negative.  Negative for cough.   Cardiovascular: Negative.  Negative for chest pain.  Gastrointestinal: Negative.  Negative for abdominal pain.  Endocrine: Negative.   Genitourinary: Negative.  Negative for dysuria.  Musculoskeletal: Positive for arthralgias, back pain and myalgias.  Skin: Negative.   Neurological: Negative.     Allergies as of 01/13/2017      Reactions   Penicillins Nausea And Vomiting, Other (See Comments)   Patient slept for 2 days. Has patient had a PCN reaction causing immediate rash, facial/tongue/throat swelling, SOB or lightheadedness with hypotension: No Has patient had a PCN reaction causing severe rash involving mucus membranes or skin necrosis: No Has patient had a PCN reaction that  required hospitalization No Has patient had a PCN reaction occurring within the last 10 years: No If all of the above answers are "NO", then may proceed with Cephalosporin use.      Medication List       Accurate as of 01/13/17  2:44 PM. Always use your most recent med list.          citalopram 40 MG tablet Commonly known as:  CELEXA Take 1 tablet (40 mg total) by mouth daily.   ibuprofen 200 MG tablet Commonly known as:  ADVIL,MOTRIN Take 400 mg by mouth every 4 (four) hours as needed for moderate pain.   lisinopril 5 MG tablet Commonly known as:  PRINIVIL,ZESTRIL Take 1 tablet (5 mg total) by mouth daily.   meloxicam 15 MG tablet Commonly known as:  MOBIC TAKE ONE (1) TABLET EACH DAY   oxyCODONE-acetaminophen 10-325 MG tablet Commonly known as:  PERCOCET Take 1 tablet by mouth every 4 (four) hours as needed for pain.   oxyCODONE-acetaminophen 10-325 MG tablet Commonly known as:  PERCOCET Take 1 tablet by mouth every 4 (four) hours as needed for pain.   oxyCODONE-acetaminophen 10-325 MG tablet Commonly known as:  PERCOCET Take 1 tablet by mouth every 6 (six) hours as needed.          Objective:    BP 135/74   Pulse 69   Temp 97 F (36.1 C) (Oral)   Ht 5\' 4"  (1.626 m)  Wt 135 lb 9.6 oz (61.5 kg)   BMI 23.28 kg/m   Allergies  Allergen Reactions  . Penicillins Nausea And Vomiting and Other (See Comments)    Patient slept for 2 days.  Has patient had a PCN reaction causing immediate rash, facial/tongue/throat swelling, SOB or lightheadedness with hypotension: No Has patient had a PCN reaction causing severe rash involving mucus membranes or skin necrosis: No Has patient had a PCN reaction that required hospitalization No Has patient had a PCN reaction occurring within the last 10 years: No If all of the above answers are "NO", then may proceed with Cephalosporin use.     Physical Exam  Constitutional: She is oriented to person, place, and time. She  appears well-developed and well-nourished.  HENT:  Head: Normocephalic and atraumatic.  Eyes: Conjunctivae and EOM are normal. Pupils are equal, round, and reactive to light.  Cardiovascular: Normal rate, regular rhythm, normal heart sounds and intact distal pulses.   Pulmonary/Chest: Effort normal and breath sounds normal.  Abdominal: Soft. Bowel sounds are normal.  Musculoskeletal:       Right shoulder: She exhibits decreased range of motion, tenderness, pain and spasm.       Lumbar back: She exhibits decreased range of motion, tenderness, pain and spasm.  Neurological: She is alert and oriented to person, place, and time. She has normal reflexes.  Skin: Skin is warm and dry. No rash noted.  Psychiatric: She has a normal mood and affect. Her behavior is normal. Judgment and thought content normal.        Assessment & Plan:   1. Chronic right-sided low back pain without sciatica - oxyCODONE-acetaminophen (PERCOCET) 10-325 MG tablet; Take 1 tablet by mouth every 4 (four) hours as needed for pain.  Dispense: 150 tablet; Refill: 0 - oxyCODONE-acetaminophen (PERCOCET) 10-325 MG tablet; Take 1 tablet by mouth every 4 (four) hours as needed for pain.  Dispense: 150 tablet; Refill: 0 - oxyCODONE-acetaminophen (PERCOCET) 10-325 MG tablet; Take 1 tablet by mouth every 6 (six) hours as needed.  Dispense: 150 tablet; Refill: 0  2. Generalized anxiety disorder - citalopram (CELEXA) 40 MG tablet; Take 1 tablet (40 mg total) by mouth daily.  Dispense: 30 tablet; Refill: 6   Continue all other maintenance medications as listed above.  Follow up plan: Recheck 3 month  Educational handout given for degenerative disc  Remus Loffler PA-C [facility] [address]  [place], [place] [address] [phone]   01/13/2017, 2:44 PM

## 2017-01-28 ENCOUNTER — Telehealth: Payer: Self-pay | Admitting: Physician Assistant

## 2017-01-28 MED ORDER — PREDNISONE 10 MG (48) PO TBPK: ORAL_TABLET | ORAL | 0 refills | Status: DC

## 2017-01-28 NOTE — Telephone Encounter (Signed)
Pt aware.

## 2017-03-30 ENCOUNTER — Other Ambulatory Visit: Payer: Self-pay | Admitting: Physician Assistant

## 2017-04-02 ENCOUNTER — Other Ambulatory Visit: Payer: Self-pay | Admitting: Physician Assistant

## 2017-04-14 ENCOUNTER — Encounter: Payer: Self-pay | Admitting: Physician Assistant

## 2017-04-14 ENCOUNTER — Ambulatory Visit (INDEPENDENT_AMBULATORY_CARE_PROVIDER_SITE_OTHER): Payer: BLUE CROSS/BLUE SHIELD | Admitting: Physician Assistant

## 2017-04-14 VITALS — BP 145/88 | HR 65 | Temp 97.4°F | Ht 64.0 in | Wt 138.4 lb

## 2017-04-14 DIAGNOSIS — M25562 Pain in left knee: Secondary | ICD-10-CM | POA: Diagnosis not present

## 2017-04-14 DIAGNOSIS — G8929 Other chronic pain: Secondary | ICD-10-CM | POA: Diagnosis not present

## 2017-04-14 DIAGNOSIS — M545 Low back pain: Secondary | ICD-10-CM

## 2017-04-14 MED ORDER — OXYCODONE-ACETAMINOPHEN 10-325 MG PO TABS: 1.0000 | ORAL_TABLET | Freq: Four times a day (QID) | ORAL | 0 refills | Status: DC | PRN

## 2017-04-14 MED ORDER — OXYCODONE-ACETAMINOPHEN 10-325 MG PO TABS: 1.0000 | ORAL_TABLET | ORAL | 0 refills | Status: DC | PRN

## 2017-04-14 NOTE — Patient Instructions (Addendum)
Arthritis Arthritis means joint pain. It can also mean joint disease. A joint is a place where bones come together. People who have arthritis may have:  Red joints.  Swollen joints.  Stiff joints.  Warm joints.  A fever.  A feeling of being sick. Follow these instructions at home: Pay attention to any changes in your symptoms. Take these actions to help with your pain and swelling. Medicines   Take over-the-counter and prescription medicines only as told by your doctor.  Do not take aspirin for pain if your doctor says that you may have gout. Activity   Rest your joint if your doctor tells you to.  Avoid activities that make the pain worse.  Exercise your joint regularly as told by your doctor. Try doing exercises like:  Swimming.  Water aerobics.  Biking.  Walking. Joint Care    If your joint is swollen, keep it raised (elevated) if told by your doctor.  If your joint feels stiff in the morning, try taking a warm shower.  If you have diabetes, do not apply heat without asking your doctor.  If told, apply heat to the joint:  Put a towel between the joint and the hot pack or heating pad.  Leave the heat on the area for 20-30 minutes.  If told, apply ice to the joint:  Put ice in a plastic bag.  Place a towel between your skin and the bag.  Leave the ice on for 20 minutes, 2-3 times per day.  Keep all follow-up visits as told by your doctor. Contact a doctor if:  The pain gets worse.  You have a fever. Get help right away if:  You have very bad pain in your joint.  You have swelling in your joint.  Your joint is red.  Many joints become painful and swollen.  You have very bad back pain.  Your leg is very weak.  You cannot control your pee (urine) or poop (stool). This information is not intended to replace advice given to you by your health care provider. Make sure you discuss any questions you have with your health care  provider. Document Released: 02/10/2010 Document Revised: 04/23/2016 Document Reviewed: 02/11/2015 Elsevier Interactive Patient Education  2017 Elsevier Inc.  

## 2017-04-14 NOTE — Progress Notes (Signed)
BP (!) 145/88   Pulse 65   Temp 97.4 F (36.3 C) (Oral)   Ht 5\' 4"  (1.626 m)   Wt 138 lb 6.4 oz (62.8 kg)   BMI 23.76 kg/m    Subjective:    Patient ID: Ashley Marquez, female    DOB: 23-Jun-1961, 56 y.o.   MRN: [id number]  HPI: Ashley Marquez is a 56 y.o. female presenting on 04/14/2017 for 3 month follow up  This patient comes in for periodic recheck on medications and conditions including arthritis, DDD, chronic pain.   All medications are reviewed today. There are no reports of any problems with the medications. All of the medical conditions are reviewed and updated.  Lab work is reviewed and will be ordered as medically necessary. There are no new problems reported with today's visit.   Relevant past medical, surgical, family and social history reviewed and updated as indicated. Allergies and medications reviewed and updated.  Past Medical History:  Diagnosis Date  . Anxiety   . Heart murmur     Past Surgical History:  Procedure Laterality Date  . ABDOMINAL HYSTERECTOMY    . None      Review of Systems  Constitutional: Negative.  Negative for activity change, fatigue and fever.  HENT: Negative.   Eyes: Negative.   Respiratory: Negative.  Negative for cough.   Cardiovascular: Negative.  Negative for chest pain.  Gastrointestinal: Negative.  Negative for abdominal pain.  Endocrine: Negative.   Genitourinary: Negative.  Negative for dysuria.  Musculoskeletal: Negative.   Skin: Negative.   Neurological: Negative.     Allergies as of 04/14/2017      Reactions   Penicillins Nausea And Vomiting, Other (See Comments)   Patient slept for 2 days. Has patient had a PCN reaction causing immediate rash, facial/tongue/throat swelling, SOB or lightheadedness with hypotension: No Has patient had a PCN reaction causing severe rash involving mucus membranes or skin necrosis: No Has patient had a PCN reaction that required hospitalization No Has patient had a PCN reaction  occurring within the last 10 years: No If all of the above answers are "NO", then may proceed with Cephalosporin use.      Medication List       Accurate as of 04/14/17  2:21 PM. Always use your most recent med list.          citalopram 40 MG tablet Commonly known as:  CELEXA Take 1 tablet (40 mg total) by mouth daily.   ibuprofen 200 MG tablet Commonly known as:  ADVIL,MOTRIN Take 400 mg by mouth every 4 (four) hours as needed for moderate pain.   lisinopril 5 MG tablet Commonly known as:  PRINIVIL,ZESTRIL Take 1 tablet (5 mg total) by mouth daily.   meloxicam 15 MG tablet Commonly known as:  MOBIC TAKE ONE (1) TABLET EACH DAY   oxyCODONE-acetaminophen 10-325 MG tablet Commonly known as:  PERCOCET Take 1 tablet by mouth every 4 (four) hours as needed for pain.   oxyCODONE-acetaminophen 10-325 MG tablet Commonly known as:  PERCOCET Take 1 tablet by mouth every 4 (four) hours as needed for pain.   oxyCODONE-acetaminophen 10-325 MG tablet Commonly known as:  PERCOCET Take 1 tablet by mouth every 6 (six) hours as needed.   predniSONE 10 MG tablet Commonly known as:  DELTASONE TAKE 6 TABLETS FOR 4 DAYS THEN TAKE 4 TABLETS FOR 4 DAYS THEN TAKE 2 TABLETS FOR 4 DAYS          Objective:  BP (!) 145/88   Pulse 65   Temp 97.4 F (36.3 C) (Oral)   Ht 5\' 4"  (1.626 m)   Wt 138 lb 6.4 oz (62.8 kg)   BMI 23.76 kg/m   Allergies  Allergen Reactions  . Penicillins Nausea And Vomiting and Other (See Comments)    Patient slept for 2 days.  Has patient had a PCN reaction causing immediate rash, facial/tongue/throat swelling, SOB or lightheadedness with hypotension: No Has patient had a PCN reaction causing severe rash involving mucus membranes or skin necrosis: No Has patient had a PCN reaction that required hospitalization No Has patient had a PCN reaction occurring within the last 10 years: No If all of the above answers are "NO", then may proceed with Cephalosporin  use.     Physical Exam  Constitutional: She is oriented to person, place, and time. She appears well-developed and well-nourished.  HENT:  Head: Normocephalic and atraumatic.  Eyes: Conjunctivae and EOM are normal. Pupils are equal, round, and reactive to light.  Cardiovascular: Normal rate, regular rhythm, normal heart sounds and intact distal pulses.   Pulmonary/Chest: Effort normal and breath sounds normal.  Abdominal: Soft. Bowel sounds are normal.  Neurological: She is alert and oriented to person, place, and time. She has normal reflexes.  Skin: Skin is warm and dry. No rash noted.  Psychiatric: She has a normal mood and affect. Her behavior is normal. Judgment and thought content normal.  Nursing note and vitals reviewed.       Assessment & Plan:   1. Chronic right-sided low back pain without sciatica - oxyCODONE-acetaminophen (PERCOCET) 10-325 MG tablet; Take 1 tablet by mouth every 4 (four) hours as needed for pain.  Dispense: 150 tablet; Refill: 0 - oxyCODONE-acetaminophen (PERCOCET) 10-325 MG tablet; Take 1 tablet by mouth every 4 (four) hours as needed for pain.  Dispense: 150 tablet; Refill: 0 - oxyCODONE-acetaminophen (PERCOCET) 10-325 MG tablet; Take 1 tablet by mouth every 6 (six) hours as needed.  Dispense: 150 tablet; Refill: 0  2. Chronic pain of left knee   Current Outpatient Prescriptions:  .  citalopram (CELEXA) 40 MG tablet, Take 1 tablet (40 mg total) by mouth daily., Disp: 30 tablet, Rfl: 6 .  ibuprofen (ADVIL,MOTRIN) 200 MG tablet, Take 400 mg by mouth every 4 (four) hours as needed for moderate pain. , Disp: , Rfl:  .  lisinopril (PRINIVIL,ZESTRIL) 5 MG tablet, Take 1 tablet (5 mg total) by mouth daily., Disp: 90 tablet, Rfl: 3 .  meloxicam (MOBIC) 15 MG tablet, TAKE ONE (1) TABLET EACH DAY, Disp: 30 tablet, Rfl: 1 .  oxyCODONE-acetaminophen (PERCOCET) 10-325 MG tablet, Take 1 tablet by mouth every 4 (four) hours as needed for pain., Disp: 150 tablet,  Rfl: 0 .  oxyCODONE-acetaminophen (PERCOCET) 10-325 MG tablet, Take 1 tablet by mouth every 4 (four) hours as needed for pain., Disp: 150 tablet, Rfl: 0 .  oxyCODONE-acetaminophen (PERCOCET) 10-325 MG tablet, Take 1 tablet by mouth every 6 (six) hours as needed., Disp: 150 tablet, Rfl: 0 .  predniSONE (DELTASONE) 10 MG tablet, TAKE 6 TABLETS FOR 4 DAYS THEN TAKE 4 TABLETS FOR 4 DAYS THEN TAKE 2 TABLETS FOR 4 DAYS, Disp: 48 tablet, Rfl: 0  Continue all other maintenance medications as listed above.  Follow up plan: Return in about 3 months (around 07/15/2017) for recheck.  Educational handout given for arthritis  Remus LofflerAngel S. Varina Hulon PA-C [facility] [address]  [place], [place] [address] [phone]   04/14/2017,  2:21 PM

## 2017-05-13 ENCOUNTER — Telehealth: Payer: Self-pay | Admitting: Physician Assistant

## 2017-05-14 NOTE — Telephone Encounter (Signed)
Pharmacy notified okay to fill

## 2017-06-03 ENCOUNTER — Telehealth (INDEPENDENT_AMBULATORY_CARE_PROVIDER_SITE_OTHER): Payer: Self-pay | Admitting: Orthopaedic Surgery

## 2017-06-03 NOTE — Telephone Encounter (Signed)
[facility]RS RECORDS & BILLS 01/18/2015 - PRESENT FAXED [facility] [phone]

## 2017-06-07 ENCOUNTER — Telehealth: Payer: Self-pay | Admitting: Physician Assistant

## 2017-06-08 ENCOUNTER — Other Ambulatory Visit: Payer: Self-pay | Admitting: Physician Assistant

## 2017-06-08 NOTE — Telephone Encounter (Signed)
Patient is going to [place] in the morning for 10 days and she is wanting to see if you will allow for her to have filled early?

## 2017-06-08 NOTE — Telephone Encounter (Signed)
Pharmacy and patient aware

## 2017-06-08 NOTE — Telephone Encounter (Signed)
Yes , that is fine. Call pharmacy.

## 2017-06-14 NOTE — Telephone Encounter (Signed)
Spoke with patient on 07/10. Please see refill encounter.

## 2017-07-05 ENCOUNTER — Encounter: Payer: Self-pay | Admitting: Physician Assistant

## 2017-07-05 ENCOUNTER — Ambulatory Visit (INDEPENDENT_AMBULATORY_CARE_PROVIDER_SITE_OTHER): Payer: BLUE CROSS/BLUE SHIELD | Admitting: Physician Assistant

## 2017-07-05 VITALS — BP 139/81 | HR 67 | Temp 97.3°F | Ht 64.0 in | Wt 139.4 lb

## 2017-07-05 DIAGNOSIS — F411 Generalized anxiety disorder: Secondary | ICD-10-CM

## 2017-07-05 DIAGNOSIS — M545 Low back pain, unspecified: Secondary | ICD-10-CM

## 2017-07-05 DIAGNOSIS — K529 Noninfective gastroenteritis and colitis, unspecified: Secondary | ICD-10-CM | POA: Diagnosis not present

## 2017-07-05 DIAGNOSIS — G8929 Other chronic pain: Secondary | ICD-10-CM

## 2017-07-05 DIAGNOSIS — M25562 Pain in left knee: Secondary | ICD-10-CM

## 2017-07-05 DIAGNOSIS — R030 Elevated blood-pressure reading, without diagnosis of hypertension: Secondary | ICD-10-CM

## 2017-07-05 MED ORDER — OXYCODONE-ACETAMINOPHEN 10-325 MG PO TABS: 1.0000 | ORAL_TABLET | ORAL | 0 refills | Status: DC | PRN

## 2017-07-05 MED ORDER — LISINOPRIL 5 MG PO TABS: 5.0000 mg | ORAL_TABLET | Freq: Every day | ORAL | 3 refills | Status: DC

## 2017-07-05 NOTE — Patient Instructions (Signed)
In a few days you may receive a survey in the mail or online from [employer] regarding your visit with us today. Please take a moment to fill this out. Your feedback is very important to our whole office. It can help us better understand your needs as well as improve your experience and satisfaction. Thank you for taking your time to complete it. We care about you.  Kemarion Abbey, PA-C  

## 2017-07-05 NOTE — Progress Notes (Signed)
BP 139/81   Pulse 67   Temp (!) 97.3 F (36.3 C) (Oral)   Ht 5\' 4"  (1.626 m)   Wt 139 lb 6.4 oz (63.2 kg)   BMI 23.93 kg/m    Subjective:    Patient ID: Ashley Marquez, female    DOB: 1961/08/15, 56 y.o.   MRN: [id number]  HPI: Ashley Marquez is a 56 y.o. female presenting on 07/05/2017 for Back Pain (follow up ) and Knee Pain (follow up)  This patient comes in for periodic recheck on medications and conditions including Chronic right-sided back pain, chronic left knee pain with osteoarthritis changes. She also has had a recent gastroenteritis. 2 days ago she started with nausea and vomiting. She is continued with diarrhea even today. Was able to eat some toast this morning and keep it down. I've encouraged her to continue a brat diet.  She is ready to go see an orthopedist for the chronicity of her left knee pain. Is starting to bother her on an daily basis and even at work. She is not able to squat and kneel very well. She does work in a daycare.  All medications are reviewed today. There are no reports of any problems with the medications. All of the medical conditions are reviewed and updated.  Lab work is reviewed and will be ordered as medically necessary. There are no new problems reported with today's visit.    Relevant past medical, surgical, family and social history reviewed and updated as indicated. Allergies and medications reviewed and updated.  Past Medical History:  Diagnosis Date  . Anxiety   . Heart murmur     Past Surgical History:  Procedure Laterality Date  . ABDOMINAL HYSTERECTOMY    . None      Review of Systems  Constitutional: Negative.  Negative for activity change, fatigue and fever.  HENT: Negative.   Eyes: Negative.   Respiratory: Negative.  Negative for cough.   Cardiovascular: Negative.  Negative for chest pain.  Gastrointestinal: Positive for diarrhea, nausea and vomiting. Negative for abdominal pain.  Endocrine: Negative.   Genitourinary:  Negative.  Negative for dysuria.  Musculoskeletal: Positive for arthralgias, back pain, joint swelling and myalgias.  Skin: Negative.   Neurological: Negative.     Allergies as of 07/05/2017      Reactions   Penicillins Nausea And Vomiting, Other (See Comments)   Patient slept for 2 days. Has patient had a PCN reaction causing immediate rash, facial/tongue/throat swelling, SOB or lightheadedness with hypotension: No Has patient had a PCN reaction causing severe rash involving mucus membranes or skin necrosis: No Has patient had a PCN reaction that required hospitalization No Has patient had a PCN reaction occurring within the last 10 years: No If all of the above answers are "NO", then may proceed with Cephalosporin use.      Medication List       Accurate as of 07/05/17  2:27 PM. Always use your most recent med list.          citalopram 40 MG tablet Commonly known as:  CELEXA Take 1 tablet (40 mg total) by mouth daily.   ibuprofen 200 MG tablet Commonly known as:  ADVIL,MOTRIN Take 400 mg by mouth every 4 (four) hours as needed for moderate pain.   lisinopril 5 MG tablet Commonly known as:  PRINIVIL,ZESTRIL Take 1 tablet (5 mg total) by mouth daily.   meloxicam 15 MG tablet Commonly known as:  MOBIC TAKE ONE (1) TABLET  EACH DAY   oxyCODONE-acetaminophen 10-325 MG tablet Commonly known as:  PERCOCET Take 1 tablet by mouth every 4 (four) hours as needed for pain.   oxyCODONE-acetaminophen 10-325 MG tablet Commonly known as:  PERCOCET Take 1 tablet by mouth every 4 (four) hours as needed for pain.   oxyCODONE-acetaminophen 10-325 MG tablet Commonly known as:  PERCOCET Take 1 tablet by mouth every 4 (four) hours as needed.          Objective:    BP 139/81   Pulse 67   Temp (!) 97.3 F (36.3 C) (Oral)   Ht 5\' 4"  (1.626 m)   Wt 139 lb 6.4 oz (63.2 kg)   BMI 23.93 kg/m   Allergies  Allergen Reactions  . Penicillins Nausea And Vomiting and Other (See  Comments)    Patient slept for 2 days.  Has patient had a PCN reaction causing immediate rash, facial/tongue/throat swelling, SOB or lightheadedness with hypotension: No Has patient had a PCN reaction causing severe rash involving mucus membranes or skin necrosis: No Has patient had a PCN reaction that required hospitalization No Has patient had a PCN reaction occurring within the last 10 years: No If all of the above answers are "NO", then may proceed with Cephalosporin use.     Physical Exam  Constitutional: She is oriented to person, place, and time. She appears well-developed and well-nourished.  HENT:  Head: Normocephalic and atraumatic.  Eyes: Pupils are equal, round, and reactive to light. Conjunctivae and EOM are normal.  Cardiovascular: Normal rate, regular rhythm, normal heart sounds and intact distal pulses.   Pulmonary/Chest: Effort normal and breath sounds normal.  Abdominal: Soft. Bowel sounds are normal.  Musculoskeletal:       Left knee: She exhibits swelling. She exhibits no deformity and no erythema. Tenderness found. Lateral joint line tenderness noted.       Legs: Neurological: She is alert and oriented to person, place, and time. She has normal reflexes.  Skin: Skin is warm and dry. No rash noted.  Psychiatric: She has a normal mood and affect. Her behavior is normal. Judgment and thought content normal.  Nursing note and vitals reviewed.       Assessment & Plan:   1. Chronic right-sided low back pain without sciatica - oxyCODONE-acetaminophen (PERCOCET) 10-325 MG tablet; Take 1 tablet by mouth every 4 (four) hours as needed for pain.  Dispense: 180 tablet; Refill: 0 - oxyCODONE-acetaminophen (PERCOCET) 10-325 MG tablet; Take 1 tablet by mouth every 4 (four) hours as needed for pain.  Dispense: 180 tablet; Refill: 0 - oxyCODONE-acetaminophen (PERCOCET) 10-325 MG tablet; Take 1 tablet by mouth every 4 (four) hours as needed.  Dispense: 180 tablet; Refill:  0  2. Generalized anxiety disorder  3. Elevated BP without diagnosis of hypertension - lisinopril (PRINIVIL,ZESTRIL) 5 MG tablet; Take 1 tablet (5 mg total) by mouth daily.  Dispense: 90 tablet; Refill: 3  4. Gastroenteritis  5. Chronic pain of left knee - Ambulatory referral to Orthopedic Surgery   Current Outpatient Prescriptions:  .  citalopram (CELEXA) 40 MG tablet, Take 1 tablet (40 mg total) by mouth daily., Disp: 30 tablet, Rfl: 6 .  ibuprofen (ADVIL,MOTRIN) 200 MG tablet, Take 400 mg by mouth every 4 (four) hours as needed for moderate pain. , Disp: , Rfl:  .  lisinopril (PRINIVIL,ZESTRIL) 5 MG tablet, Take 1 tablet (5 mg total) by mouth daily., Disp: 90 tablet, Rfl: 3 .  meloxicam (MOBIC) 15 MG tablet, TAKE ONE (1) TABLET  EACH DAY, Disp: 30 tablet, Rfl: 1 .  oxyCODONE-acetaminophen (PERCOCET) 10-325 MG tablet, Take 1 tablet by mouth every 4 (four) hours as needed for pain., Disp: 180 tablet, Rfl: 0 .  oxyCODONE-acetaminophen (PERCOCET) 10-325 MG tablet, Take 1 tablet by mouth every 4 (four) hours as needed for pain., Disp: 180 tablet, Rfl: 0 .  oxyCODONE-acetaminophen (PERCOCET) 10-325 MG tablet, Take 1 tablet by mouth every 4 (four) hours as needed., Disp: 180 tablet, Rfl: 0  Continue all other maintenance medications as listed above.  Follow up plan: Return in about 3 months (around 10/05/2017) for recheck.  Educational handout given for survey  Remus Loffler PA-C [facility] [address]  [place], [place] [address] [phone]   07/05/2017, 2:27 PM

## 2017-07-28 ENCOUNTER — Encounter (INDEPENDENT_AMBULATORY_CARE_PROVIDER_SITE_OTHER): Payer: Self-pay | Admitting: Orthopaedic Surgery

## 2017-07-28 ENCOUNTER — Ambulatory Visit (INDEPENDENT_AMBULATORY_CARE_PROVIDER_SITE_OTHER): Payer: Self-pay

## 2017-07-28 ENCOUNTER — Ambulatory Visit (INDEPENDENT_AMBULATORY_CARE_PROVIDER_SITE_OTHER): Payer: BLUE CROSS/BLUE SHIELD | Admitting: Orthopaedic Surgery

## 2017-07-28 VITALS — BP 133/87 | HR 80 | Ht 65.0 in | Wt 135.0 lb

## 2017-07-28 DIAGNOSIS — M25562 Pain in left knee: Secondary | ICD-10-CM
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
## 2017-08-06 ENCOUNTER — Ambulatory Visit (INDEPENDENT_AMBULATORY_CARE_PROVIDER_SITE_OTHER): Payer: BLUE CROSS/BLUE SHIELD | Admitting: Family Medicine

## 2017-08-06 ENCOUNTER — Encounter: Payer: Self-pay | Admitting: Family Medicine

## 2017-08-06 VITALS — BP 147/86 | HR 77 | Temp 96.0°F | Ht 65.0 in | Wt 140.4 lb

## 2017-08-06 DIAGNOSIS — J029 Acute pharyngitis, unspecified: Secondary | ICD-10-CM | POA: Diagnosis not present

## 2017-08-06 LAB — RAPID STREP SCREEN (MED CTR MEBANE ONLY): Strep Gp A Ag, IA W/Reflex: NEGATIVE

## 2017-08-06 LAB — CULTURE, GROUP A STREP

## 2017-08-06 MED ORDER — CEFDINIR 300 MG PO CAPS: 300.0000 mg | ORAL_CAPSULE | Freq: Two times a day (BID) | ORAL | 0 refills | Status: DC

## 2017-08-06 NOTE — Progress Notes (Signed)
   HPI  Patient presents today here with sore throat.  Patient's when she's had sore throat, left ear pain, cough, and severe sneezing for about 12 hours, her symptoms began yesterday and then suddenly worsened this morning at 2 AM with severe left ear pain.  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 She has penicillin allergy described as nausea and vomiting.  She is tolerating food and fluids like usual. No shortness of breath  PMH: Smoking status noted ROS: Per HPI  Objective: BP (!) 147/86   Pulse 77   Temp (!) 96 F (35.6 C) (Oral)   Ht 5\' 5"  (1.651 m)   Wt 140 lb 6.4 oz (63.7 kg)   BMI 23.36 kg/m  Gen: NAD, alert, cooperative with exam HEENT: NCAT, TMs  bilaterally with small amount of purulent fluid bilaterally, no erythema CV: RRR, good S1/S2, no murmur Resp: CTABL, no wheezes, non-labored Abd: SNTND, BS present, no guarding or organomegaly Ext: No edema, warm Neuro: Alert and oriented, No gross deficits  Assessment and plan:  # Pharyngitis Nonspecific pharyngitis, strep test is pending area Patient with purulent material bilaterally on behind the TMs, no clear otitis media, however I think she is developing otitis media and 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 Culture, Group A Strep    Order Specific Question:   Source    Answer:   throat  . Rapid [facility] screen ([facility])    Meds ordered this encounter  Medications  . cefdinir (OMNICEF) 300 MG capsule    Sig: Take 1 capsule (300 mg total) by mouth 2 (two) times daily. 1 po BID    Dispense:  20 capsule    Refill:  0    Murtis SinkSam Bradshaw, MD [facility] 08/06/2017, 10:53 AM

## 2017-08-06 NOTE — Patient Instructions (Signed)
Great to meet you!  Start omnicef today   Try to get rest, take tylenol as needed every 6 hours

## 2017-08-09 LAB — CULTURE, GROUP A STREP: Strep A Culture: NEGATIVE

## 2017-08-20 ENCOUNTER — Encounter: Payer: Self-pay | Admitting: Nurse Practitioner

## 2017-08-20 ENCOUNTER — Ambulatory Visit (INDEPENDENT_AMBULATORY_CARE_PROVIDER_SITE_OTHER): Payer: BLUE CROSS/BLUE SHIELD | Admitting: Nurse Practitioner

## 2017-08-20 VITALS — BP 140/86 | HR 86 | Temp 97.1°F | Ht 65.0 in | Wt 140.0 lb

## 2017-08-20 DIAGNOSIS — L509 Urticaria, unspecified: Secondary | ICD-10-CM | POA: Diagnosis not present

## 2017-08-20 MED ORDER — METHYLPREDNISOLONE ACETATE 80 MG/ML IJ SUSP
80.0000 mg | Freq: Once | INTRAMUSCULAR | Status: AC
  Administered 2017-08-20: 80 mg via INTRAMUSCULAR

## 2017-08-20 MED ORDER — PREDNISONE 20 MG PO TABS: ORAL_TABLET | ORAL | 0 refills | Status: DC

## 2017-08-20 NOTE — Progress Notes (Signed)
   Subjective:    Patient ID: Ashley Marquez, female    DOB: 02-Dec-1960, 56 y.o.   MRN: [id number]  HPI Patient comes in today c/o itching. Started yesterday morning. Feels like her body is stinging then she scratches area and welps up. She recently changed washing powders and most of her itching is where her clothes are touching her.     Review of Systems  Constitutional: Negative.   HENT: Negative.   Respiratory: Negative.   Cardiovascular: Negative.   Genitourinary: Negative.   Skin: Positive for rash.  Neurological: Negative.   Psychiatric/Behavioral: Negative.   All other systems reviewed and are negative.      Objective:   Physical Exam  Constitutional: She is oriented to person, place, and time. She appears well-developed and well-nourished. No distress.  Cardiovascular: Normal rate and regular rhythm.   Pulmonary/Chest: Effort normal and breath sounds normal.  Neurological: She is alert and oriented to person, place, and time.  Skin: Skin is warm.  Erythematous whelp areas on trunk  Psychiatric: She has a normal mood and affect. Her behavior is normal. Judgment and thought content normal.   BP 140/86   Pulse 86   Temp (!) 97.1 F (36.2 C) (Oral)   Ht  (1.651 m)   Wt 140 lb (63.5 kg)   BMI 23.30 kg/m      Assessment & Plan:   1. Urticaria    Meds ordered this encounter  Medications  . methylPREDNISolone acetate (DEPO-MEDROL) injection 80 mg  . predniSONE (DELTASONE) 20 MG tablet    Sig: 2 po at sametime daily for 5 days- start tomorrow    Dispense:  10 tablet    Refill:  0    Order Specific Question:   Supervising Provider    Answer:   Johna Sheriff [[id number]]   Avoid scratching Change back to old detergent RTO prn  Mary-Margaret Daphine Deutscher, FNP

## 2017-08-20 NOTE — Patient Instructions (Signed)
Angioedema  Angioedema is sudden swelling in the body. The swelling can happen in any part of the body. It often happens on the skin and causes itchy, bumpy patches (hives) to form.  This condition may:  · Happen only one time.  · Happen more than one time. It may come back at random times.  · Keep coming back for a number of years. Someday it may stop coming back.    Follow these instructions at home:  · Take over-the-counter and prescription medicines only as told by your doctor.  · If you were given medicines for emergency allergy treatment, always carry them with you.  · Wear a medical bracelet as told by your doctor.  · Avoid the things that cause your attacks (triggers).  · If this condition was passed to you from your parents and you want to have kids, talk to your doctor. Your kids may also have this condition.  Contact a doctor if:  · You have another attack.  · Your attacks happen more often, even after you take steps to prevent them.  · This condition was passed to you by your parents and you want to have kids.  Get help right away if:  · Your mouth, tongue, or lips get very swollen.  · You have trouble breathing.  · You have trouble swallowing.  · You pass out (faint).  This information is not intended to replace advice given to you by your health care provider. Make sure you discuss any questions you have with your health care provider.  Document Released: 11/04/2009 Document Revised: 06/17/2016 Document Reviewed: 05/26/2016  Elsevier Interactive Patient Education © 2017 Elsevier Inc[employer]

## 2017-09-29 ENCOUNTER — Ambulatory Visit (INDEPENDENT_AMBULATORY_CARE_PROVIDER_SITE_OTHER): Payer: BLUE CROSS/BLUE SHIELD | Admitting: Physician Assistant

## 2017-09-29 ENCOUNTER — Encounter: Payer: Self-pay | Admitting: Physician Assistant

## 2017-09-29 VITALS — BP 123/80 | HR 77 | Temp 97.6°F | Ht 65.0 in | Wt 141.4 lb

## 2017-09-29 DIAGNOSIS — M25562 Pain in left knee: Secondary | ICD-10-CM

## 2017-09-29 DIAGNOSIS — M1732 Unilateral post-traumatic osteoarthritis, left knee: Secondary | ICD-10-CM | POA: Diagnosis not present

## 2017-09-29 DIAGNOSIS — G8929 Other chronic pain: Secondary | ICD-10-CM

## 2017-09-29 DIAGNOSIS — M545 Low back pain, unspecified: Secondary | ICD-10-CM

## 2017-09-29 MED ORDER — OXYCODONE-ACETAMINOPHEN 10-325 MG PO TABS: 1.0000 | ORAL_TABLET | ORAL | 0 refills | Status: DC | PRN

## 2017-09-29 NOTE — Patient Instructions (Signed)
In a few days you may receive a survey in the mail or online from [employer] regarding your visit with us today. Please take a moment to fill this out. Your feedback is very important to our whole office. It can help us better understand your needs as well as improve your experience and satisfaction. Thank you for taking your time to complete it. We care about you.  Telford Archambeau, PA-C  
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
## 2017-11-03 ENCOUNTER — Ambulatory Visit (INDEPENDENT_AMBULATORY_CARE_PROVIDER_SITE_OTHER): Payer: BLUE CROSS/BLUE SHIELD | Admitting: Orthopaedic Surgery

## 2017-11-16 ENCOUNTER — Other Ambulatory Visit: Payer: Self-pay | Admitting: Physician Assistant

## 2017-12-08 ENCOUNTER — Encounter (INDEPENDENT_AMBULATORY_CARE_PROVIDER_SITE_OTHER): Payer: Self-pay | Admitting: Orthopaedic Surgery

## 2017-12-08 ENCOUNTER — Ambulatory Visit (INDEPENDENT_AMBULATORY_CARE_PROVIDER_SITE_OTHER): Payer: BLUE CROSS/BLUE SHIELD | Admitting: Orthopaedic Surgery

## 2017-12-08 DIAGNOSIS — M25562 Pain in left knee: Secondary | ICD-10-CM

## 2017-12-08 DIAGNOSIS — M1732 Unilateral post-traumatic osteoarthritis, left knee: Secondary | ICD-10-CM | POA: Diagnosis not present

## 2017-12-08 DIAGNOSIS — G8929 Other chronic pain: Secondary | ICD-10-CM | POA: Diagnosis not present

## 2017-12-08 MED ORDER — BUPIVACAINE HCL 0.5 % IJ SOLN
2.0000 mL | INTRAMUSCULAR | Status: AC | PRN
  Administered 2017-12-08: 2 mL via INTRA_ARTICULAR

## 2017-12-08 MED ORDER — LIDOCAINE HCL 1 % IJ SOLN
2.0000 mL | INTRAMUSCULAR | Status: AC | PRN
Start: 2017-12-08 — End: 2017-12-08
  Administered 2017-12-08: 2 mL

## 2017-12-08 MED ORDER — METHYLPREDNISOLONE ACETATE 40 MG/ML IJ SUSP
80.0000 mg | INTRAMUSCULAR | Status: AC | PRN
  Administered 2017-12-08: 80 mg
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
## 2017-12-21 ENCOUNTER — Telehealth: Payer: Self-pay | Admitting: Physician Assistant

## 2017-12-21 MED ORDER — AZITHROMYCIN 250 MG PO TABS: ORAL_TABLET | ORAL | 0 refills | Status: DC

## 2017-12-21 NOTE — Telephone Encounter (Signed)
Patient aware.

## 2017-12-29 ENCOUNTER — Encounter: Payer: Self-pay | Admitting: Physician Assistant

## 2017-12-29 ENCOUNTER — Ambulatory Visit: Payer: BLUE CROSS/BLUE SHIELD | Admitting: Physician Assistant

## 2017-12-29 VITALS — BP 138/80 | HR 69 | Temp 97.3°F | Ht 65.0 in | Wt 142.4 lb

## 2017-12-29 DIAGNOSIS — G8929 Other chronic pain: Secondary | ICD-10-CM | POA: Diagnosis not present

## 2017-12-29 DIAGNOSIS — M545 Low back pain: Secondary | ICD-10-CM | POA: Diagnosis not present

## 2017-12-29 DIAGNOSIS — M1732 Unilateral post-traumatic osteoarthritis, left knee: Secondary | ICD-10-CM | POA: Diagnosis not present

## 2017-12-29 MED ORDER — OXYCODONE-ACETAMINOPHEN 10-325 MG PO TABS: 1.0000 | ORAL_TABLET | ORAL | 0 refills | Status: DC | PRN

## 2017-12-29 MED ORDER — OXYCODONE-ACETAMINOPHEN 10-325 MG PO TABS
1.0000 | ORAL_TABLET | ORAL | 0 refills | Status: DC | PRN
Start: 2017-12-29 — End: 2017-12-29

## 2017-12-29 MED ORDER — OMEPRAZOLE 20 MG PO CPDR: DELAYED_RELEASE_CAPSULE | ORAL | 3 refills | Status: DC

## 2017-12-29 NOTE — Patient Instructions (Signed)
In a few days you may receive a survey in the mail or online from [employer] regarding your visit with us today. Please take a moment to fill this out. Your feedback is very important to our whole office. It can help us better understand your needs as well as improve your experience and satisfaction. Thank you for taking your time to complete it. We care about you.  Makenna Macaluso, PA-C  

## 2017-12-29 NOTE — Progress Notes (Signed)
BP 138/80   Pulse 69   Temp (!) 97.3 F (36.3 C) (Oral)   Ht 5\' 5"  (1.651 m)   Wt 142 lb 6.4 oz (64.6 kg)   BMI 23.70 kg/m    Subjective:    Patient ID: Ashley Marquez, female    DOB: [birth year], 57 y.o.   MRN: [id number]  HPI: Ashley Marquez is a 57 y.o. female presenting on 12/29/2017 for Medication Refill  This patient comes in for periodic recheck on medications and conditions including chronic right-sided back pain and severe left knee osteoarthritis post trauma.  She had seen Dr. Cleophas DunkerWhitfield.  The injection lasted about 5 days and the pain was back worse than ever.  I encouraged her to call his office back and go when her further discussions and treatment..   All medications are reviewed today. There are no reports of any problems with the medications. All of the medical conditions are reviewed and updated.  Lab work is reviewed and will be ordered as medically necessary. There are no new problems reported with today's visit.   Relevant past medical, surgical, family and social history reviewed and updated as indicated. Allergies and medications reviewed and updated.  Past Medical History:  Diagnosis Date  . Anxiety   . Heart murmur     Past Surgical History:  Procedure Laterality Date  . ABDOMINAL HYSTERECTOMY    . None      Review of Systems  Constitutional: Negative.  Negative for activity change, fatigue and fever.  HENT: Negative.   Eyes: Negative.   Respiratory: Negative.  Negative for cough.   Cardiovascular: Negative.  Negative for chest pain.  Gastrointestinal: Negative.  Negative for abdominal pain.  Endocrine: Negative.   Genitourinary: Negative.  Negative for dysuria.  Musculoskeletal: Positive for arthralgias, back pain, gait problem and joint swelling.  Skin: Negative.     Allergies as of 12/29/2017      Reactions   Penicillins Nausea And Vomiting, Other (See Comments)   Patient slept for 2 days. Has patient had a PCN reaction causing immediate  rash, facial/tongue/throat swelling, SOB or lightheadedness with hypotension: No Has patient had a PCN reaction causing severe rash involving mucus membranes or skin necrosis: No Has patient had a PCN reaction that required hospitalization No Has patient had a PCN reaction occurring within the last 10 years: No If all of the above answers are "NO", then may proceed with Cephalosporin use.      Medication List        Accurate as of 12/29/17  3:04 PM. Always use your most recent med list.          ibuprofen 200 MG tablet Commonly known as:  ADVIL,MOTRIN Take 400 mg by mouth every 4 (four) hours as needed for moderate pain.   lisinopril 5 MG tablet Commonly known as:  PRINIVIL,ZESTRIL Take 1 tablet (5 mg total) by mouth daily.   omeprazole 20 MG capsule Commonly known as:  PRILOSEC TAKE ONE (1) CAPSULE EACH DAY   oxyCODONE-acetaminophen 10-325 MG tablet Commonly known as:  PERCOCET Take 1 tablet by mouth every 4 (four) hours as needed.   oxyCODONE-acetaminophen 10-325 MG tablet Commonly known as:  PERCOCET Take 1 tablet by mouth every 4 (four) hours as needed for pain.   oxyCODONE-acetaminophen 10-325 MG tablet Commonly known as:  PERCOCET Take 1 tablet by mouth every 4 (four) hours as needed for pain.          Objective:  BP 138/80   Pulse 69   Temp (!) 97.3 F (36.3 C) (Oral)   Ht 5\' 5"  (1.651 m)   Wt 142 lb 6.4 oz (64.6 kg)   BMI 23.70 kg/m   Allergies  Allergen Reactions  . Penicillins Nausea And Vomiting and Other (See Comments)    Patient slept for 2 days.  Has patient had a PCN reaction causing immediate rash, facial/tongue/throat swelling, SOB or lightheadedness with hypotension: No Has patient had a PCN reaction causing severe rash involving mucus membranes or skin necrosis: No Has patient had a PCN reaction that required hospitalization No Has patient had a PCN reaction occurring within the last 10 years: No If all of the above answers are "NO",  then may proceed with Cephalosporin use.     Physical Exam  Constitutional: She is oriented to person, place, and time. She appears well-developed and well-nourished.  HENT:  Head: Normocephalic and atraumatic.  Eyes: Conjunctivae and EOM are normal. Pupils are equal, round, and reactive to light.  Cardiovascular: Normal rate, regular rhythm, normal heart sounds and intact distal pulses.  Pulmonary/Chest: Effort normal and breath sounds normal.  Abdominal: Soft. Bowel sounds are normal.  Neurological: She is alert and oriented to person, place, and time. She has normal reflexes.  Skin: Skin is warm and dry. No rash noted.  Psychiatric: She has a normal mood and affect. Her behavior is normal. Judgment and thought content normal.  Nursing note and vitals reviewed.       Assessment & Plan:   1. Chronic right-sided low back pain without sciatica - oxyCODONE-acetaminophen (PERCOCET) 10-325 MG tablet; Take 1 tablet by mouth every 4 (four) hours as needed.  Dispense: 180 tablet; Refill: 0 - oxyCODONE-acetaminophen (PERCOCET) 10-325 MG tablet; Take 1 tablet by mouth every 4 (four) hours as needed for pain.  Dispense: 180 tablet; Refill: 0 - oxyCODONE-acetaminophen (PERCOCET) 10-325 MG tablet; Take 1 tablet by mouth every 4 (four) hours as needed for pain.  Dispense: 180 tablet; Refill: 0  2. Post-traumatic osteoarthritis of left knee    Current Outpatient Medications:  .  ibuprofen (ADVIL,MOTRIN) 200 MG tablet, Take 400 mg by mouth every 4 (four) hours as needed for moderate pain. , Disp: , Rfl:  .  lisinopril (PRINIVIL,ZESTRIL) 5 MG tablet, Take 1 tablet (5 mg total) by mouth daily., Disp: 90 tablet, Rfl: 3 .  omeprazole (PRILOSEC) 20 MG capsule, TAKE ONE (1) CAPSULE EACH DAY, Disp: 90 capsule, Rfl: 3 .  oxyCODONE-acetaminophen (PERCOCET) 10-325 MG tablet, Take 1 tablet by mouth every 4 (four) hours as needed., Disp: 180 tablet, Rfl: 0 .  oxyCODONE-acetaminophen (PERCOCET) 10-325 MG  tablet, Take 1 tablet by mouth every 4 (four) hours as needed for pain., Disp: 180 tablet, Rfl: 0 .  oxyCODONE-acetaminophen (PERCOCET) 10-325 MG tablet, Take 1 tablet by mouth every 4 (four) hours as needed for pain., Disp: 180 tablet, Rfl: 0 Continue all other maintenance medications as listed above.  Follow up plan: Return in about 3 months (around 03/29/2018) for recheck.  Educational handout given for survey   Remus Loffler PA-C [facility] [address]  [place], [place] [address] [phone]   12/29/2017, 3:04 PM

## 2018-01-14 ENCOUNTER — Telehealth (INDEPENDENT_AMBULATORY_CARE_PROVIDER_SITE_OTHER): Payer: Self-pay | Admitting: Orthopaedic Surgery

## 2018-01-14 NOTE — Telephone Encounter (Signed)
12[facility]08/2017 [facility] [place] [facility] [phone], ph [phone]

## 2018-01-17 ENCOUNTER — Institutional Professional Consult (permissible substitution) (INDEPENDENT_AMBULATORY_CARE_PROVIDER_SITE_OTHER): Payer: BLUE CROSS/BLUE SHIELD | Admitting: Orthopaedic Surgery

## 2018-01-18 ENCOUNTER — Other Ambulatory Visit: Payer: Self-pay | Admitting: Physician Assistant

## 2018-01-19 ENCOUNTER — Encounter: Payer: Self-pay | Admitting: Family Medicine

## 2018-01-19 ENCOUNTER — Ambulatory Visit: Payer: BLUE CROSS/BLUE SHIELD | Admitting: Family Medicine

## 2018-01-19 VITALS — BP 134/83 | HR 92 | Temp 98.0°F | Ht 65.0 in | Wt 140.4 lb

## 2018-01-19 DIAGNOSIS — J101 Influenza due to other identified influenza virus with other respiratory manifestations: Secondary | ICD-10-CM | POA: Diagnosis not present

## 2018-01-19 LAB — VERITOR FLU A/B WAIVED
Influenza A: POSITIVE — AB
Influenza B: NEGATIVE

## 2018-01-19 MED ORDER — OSELTAMIVIR PHOSPHATE 75 MG PO CAPS: 75.0000 mg | ORAL_CAPSULE | Freq: Two times a day (BID) | ORAL | 0 refills | Status: DC

## 2018-01-19 NOTE — Patient Instructions (Signed)
Great to see you!   Influenza, Adult Influenza, more commonly known as "the flu," is a viral infection that primarily affects the respiratory tract. The respiratory tract includes organs that help you breathe, such as the lungs, nose, and throat. The flu causes many common cold symptoms, as well as a high fever and body aches. The flu spreads easily from person to person (is contagious). Getting a flu shot (influenza vaccination) every year is the best way to prevent influenza. What are the causes? Influenza is caused by a virus. You can catch the virus by:  Breathing in droplets from an infected person's cough or sneeze.  Touching something that was recently contaminated with the virus and then touching your mouth, nose, or eyes. What increases the risk? The following factors may make you more likely to get the flu:  Not cleaning your hands frequently with soap and water or alcohol-based hand sanitizer.  Having close contact with many people during cold and flu season.  Touching your mouth, eyes, or nose without washing or sanitizing your hands first.  Not drinking enough fluids or not eating a healthy diet.  Not getting enough sleep or exercise.  Being under a high amount of stress.  Not getting a yearly (annual) flu shot. You may be at a higher risk of complications from the flu, such as a severe lung infection (pneumonia), if you:  Are over the age of 65.  Are pregnant.  Have a weakened disease-fighting system (immune system). You may have a weakened immune system if you:  Have HIV or AIDS.  Are undergoing chemotherapy.  Aretaking medicines that reduce the activity of (suppress) the immune system.  Have a long-term (chronic) illness, such as heart disease, kidney disease, diabetes, or lung disease.  Have a liver disorder.  Are obese.  Have anemia. What are the signs or symptoms? Symptoms of this condition typically last 4-10 days and may  include:  Fever.  Chills.  Headache, body aches, or muscle aches.  Sore throat.  Cough.  Runny or congested nose.  Chest discomfort and cough.  Poor appetite.  Weakness or tiredness (fatigue).  Dizziness.  Nausea or vomiting. How is this diagnosed? This condition may be diagnosed based on your medical history and a physical exam. Your health care provider may do a nose or throat swab test to confirm the diagnosis. How is this treated? If influenza is detected early, you can be treated with antiviral medicine that can reduce the length of your illness and the severity of your symptoms. This medicine may be given by mouth (orally) or through an IV tube that is inserted in one of your veins. The goal of treatment is to relieve symptoms by taking care of yourself at home. This may include taking over-the-counter medicines, drinking plenty of fluids, and adding humidity to the air in your home. In some cases, influenza goes away on its own. Severe influenza or complications from influenza may be treated in a hospital. Follow these instructions at home:  Take over-the-counter and prescription medicines only as told by your health care provider.  Use a cool mist humidifier to add humidity to the air in your home. This can make breathing easier.  Rest as needed.  Drink enough fluid to keep your urine clear or pale yellow.  Cover your mouth and nose when you cough or sneeze.  Wash your hands with soap and water often, especially after you cough or sneeze. If soap and water are not available, use   hand sanitizer.  Stay home from work or school as told by your health care provider. Unless you are visiting your health care provider, try to avoid leaving home until your fever has been gone for 24 hours without the use of medicine.  Keep all follow-up visits as told by your health care provider. This is important. How is this prevented?  Getting an annual flu shot is the best way to  avoid getting the flu. You may get the flu shot in late summer, fall, or winter. Ask your health care provider when you should get your flu shot.  Wash your hands often or use hand sanitizer often.  Avoid contact with people who are sick during cold and flu season.  Eat a healthy diet, drink plenty of fluids, get enough sleep, and exercise regularly. Contact a health care provider if:  You develop new symptoms.  You have:  Chest pain.  Diarrhea.  A fever.  Your cough gets worse.  You produce more mucus.  You feel nauseous or you vomit. Get help right away if:  You develop shortness of breath or difficulty breathing.  Your skin or nails turn a bluish color.  You have severe pain or stiffness in your neck.  You develop a sudden headache or sudden pain in your face or ear.  You cannot stop vomiting. This information is not intended to replace advice given to you by your health care provider. Make sure you discuss any questions you have with your health care provider. Document Released: 11/13/2000 Document Revised: 04/23/2016 Document Reviewed: 09/10/2015 Elsevier Interactive Patient Education  2017 [employer]  

## 2018-01-19 NOTE — Progress Notes (Signed)
   HPI  Patient presents today due to illness.  Patient states she has had about 48 hours of symptoms including cough, body aches, chills, sweats, and now loose stools.  She is tolerating food and fluids by mouth without nausea or vomiting. She denies significant shortness of breath.  She does have persistent cough.  She is a non-smoker.  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  PMH: Smoking status noted ROS: Per HPI  Objective: BP 134/83   Pulse 92   Temp 98 F (36.7 C) (Oral)   Ht 5\' 5"  (1.651 m)   Wt 140 lb 6.4 oz (63.7 kg)   SpO2 96%   BMI 23.36 kg/m  Gen: NAD, alert, cooperative with exam HEENT: NCAT, right-sided maxillary tenderness to palpation, oropharynx moist and clear, TMs normal bilaterally CV: RRR, good S1/S2, no murmur Resp: CTABL, no wheezes, non-labored Ext: No edema, warm Neuro: Alert and oriented, No gross deficits  Assessment and plan:  # Influenza Pt is right at 48 hours symptoms, start tamiflu Discussed supportive care RTC with any concerns    Orders Placed This Encounter  Procedures  . Veritor Flu A/B Waived    Order Specific Question:   Source    Answer:   nasal    Meds ordered this encounter  Medications  . oseltamivir (TAMIFLU) 75 MG capsule    Sig: Take 1 capsule (75 mg total) by mouth 2 (two) times daily.    Dispense:  10 capsule    Refill:  0    Murtis SinkSam Bradshaw, MD [facility] 01/19/2018, 11:05 AM

## 2018-03-15 ENCOUNTER — Encounter: Payer: Self-pay | Admitting: Physician Assistant

## 2018-03-15 ENCOUNTER — Ambulatory Visit: Payer: BLUE CROSS/BLUE SHIELD | Admitting: Physician Assistant

## 2018-03-15 VITALS — BP 140/79 | HR 75 | Temp 97.0°F | Ht 65.0 in | Wt 141.0 lb

## 2018-03-15 DIAGNOSIS — J40 Bronchitis, not specified as acute or chronic: Secondary | ICD-10-CM

## 2018-03-15 DIAGNOSIS — M545 Low back pain: Secondary | ICD-10-CM

## 2018-03-15 DIAGNOSIS — G8929 Other chronic pain: Secondary | ICD-10-CM | POA: Diagnosis not present

## 2018-03-15 DIAGNOSIS — F321 Major depressive disorder, single episode, moderate: Secondary | ICD-10-CM | POA: Diagnosis not present

## 2018-03-15 MED ORDER — OXYCODONE-ACETAMINOPHEN 10-325 MG PO TABS: 1.0000 | ORAL_TABLET | ORAL | 0 refills | Status: DC | PRN

## 2018-03-15 MED ORDER — CITALOPRAM HYDROBROMIDE 40 MG PO TABS: 40.0000 mg | ORAL_TABLET | Freq: Every day | ORAL | 11 refills | Status: DC

## 2018-03-15 MED ORDER — CETIRIZINE HCL 10 MG PO TABS: 10.0000 mg | ORAL_TABLET | Freq: Every day | ORAL | 11 refills | Status: DC

## 2018-03-15 MED ORDER — AZITHROMYCIN 250 MG PO TABS: ORAL_TABLET | ORAL | 0 refills | Status: DC

## 2018-03-15 NOTE — Patient Instructions (Signed)
In a few days you may receive a survey in the mail or online from [employer] regarding your visit with us today. Please take a moment to fill this out. Your feedback is very important to our whole office. It can help us better understand your needs as well as improve your experience and satisfaction. Thank you for taking your time to complete it. We care about you.  Pope Brunty, PA-C  

## 2018-03-16 NOTE — Progress Notes (Signed)
BP 140/79 (BP Location: Left Arm)   Pulse 75   Temp (!) 97 F (36.1 C)   Ht 5\' 5"  (1.651 m)   Wt 141 lb (64 kg)   BMI 23.46 kg/m    Subjective:    Patient ID: Ashley Marquez, female    DOB: 08/02/1961, 57 y.o.   MRN: [id number]  HPI: Ashley Marquez is a 57 y.o. female presenting on 03/15/2018 for Medication Refill and Cough (right ear pain)  Patient comes in for recheck on her chronic low back pain.  She is doing fairly well with her condition.  She is able to work.  She is very careful in her lifting.  She does have more depression going on.  She is quite agitated at times.  She is raising a teenage grandson.  There is a lot of conflict there.  Patient with several days of progressing upper respiratory and bronchial symptoms. Initially there was more upper respiratory congestion. This progressed to having significant cough that is productive throughout the day and severe at night. There is occasional wheezing after coughing. Sometimes there is slight dyspnea on exertion. It is productive mucus that is yellow in color. Denies any blood.  Past Medical History:  Diagnosis Date  . Anxiety   . Heart murmur    Relevant past medical, surgical, family and social history reviewed and updated as indicated. Interim medical history since our last visit reviewed. Allergies and medications reviewed and updated. DATA REVIEWED: CHART IN EPIC  Family History reviewed for pertinent findings.  Review of Systems  Constitutional: Positive for chills and fatigue. Negative for activity change and appetite change.  HENT: Positive for congestion, postnasal drip and sore throat.   Eyes: Negative.   Respiratory: Positive for cough and wheezing.   Cardiovascular: Negative.  Negative for chest pain, palpitations and leg swelling.  Gastrointestinal: Negative.   Genitourinary: Negative.   Musculoskeletal: Negative.   Skin: Negative.   Neurological: Positive for headaches.    Allergies as of  03/15/2018      Reactions   Penicillins Nausea And Vomiting, Other (See Comments)   Patient slept for 2 days. Has patient had a PCN reaction causing immediate rash, facial/tongue/throat swelling, SOB or lightheadedness with hypotension: No Has patient had a PCN reaction causing severe rash involving mucus membranes or skin necrosis: No Has patient had a PCN reaction that required hospitalization No Has patient had a PCN reaction occurring within the last 10 years: No If all of the above answers are "NO", then may proceed with Cephalosporin use.      Medication List        Accurate as of 03/15/18 11:59 PM. Always use your most recent med list.          azithromycin 250 MG tablet Commonly known as:  ZITHROMAX Take pack as directed   cetirizine 10 MG tablet Commonly known as:  ZYRTEC Take 1 tablet (10 mg total) by mouth daily.   citalopram 40 MG tablet Commonly known as:  CELEXA Take 1 tablet (40 mg total) by mouth daily.   ibuprofen 200 MG tablet Commonly known as:  ADVIL,MOTRIN Take 400 mg by mouth every 4 (four) hours as needed for moderate pain.   lisinopril 5 MG tablet Commonly known as:  PRINIVIL,ZESTRIL Take 1 tablet (5 mg total) by mouth daily.   omeprazole 20 MG capsule Commonly known as:  PRILOSEC TAKE ONE (1) CAPSULE EACH DAY   oxyCODONE-acetaminophen 10-325 MG tablet Commonly known  as:  PERCOCET Take 1 tablet by mouth every 4 (four) hours as needed.   oxyCODONE-acetaminophen 10-325 MG tablet Commonly known as:  PERCOCET Take 1 tablet by mouth every 4 (four) hours as needed for pain.   oxyCODONE-acetaminophen 10-325 MG tablet Commonly known as:  PERCOCET Take 1 tablet by mouth every 4 (four) hours as needed for pain.          Objective:    BP 140/79 (BP Location: Left Arm)   Pulse 75   Temp (!) 97 F (36.1 C)   Ht 5\' 5"  (1.651 m)   Wt 141 lb (64 kg)   BMI 23.46 kg/m   Allergies  Allergen Reactions  . Penicillins Nausea And Vomiting and  Other (See Comments)    Patient slept for 2 days.  Has patient had a PCN reaction causing immediate rash, facial/tongue/throat swelling, SOB or lightheadedness with hypotension: No Has patient had a PCN reaction causing severe rash involving mucus membranes or skin necrosis: No Has patient had a PCN reaction that required hospitalization No Has patient had a PCN reaction occurring within the last 10 years: No If all of the above answers are "NO", then may proceed with Cephalosporin use.     Wt Readings from Last 3 Encounters:  03/15/18 141 lb (64 kg)  01/19/18 140 lb 6.4 oz (63.7 kg)  12/29/17 142 lb 6.4 oz (64.6 kg)    Physical Exam  Constitutional: She is oriented to person, place, and time. She appears well-developed and well-nourished.  HENT:  Head: Normocephalic and atraumatic.  Right Ear: There is drainage and tenderness.  Left Ear: There is drainage and tenderness.  Nose: Mucosal edema and rhinorrhea present. Right sinus exhibits no maxillary sinus tenderness and no frontal sinus tenderness. Left sinus exhibits no maxillary sinus tenderness and no frontal sinus tenderness.  Mouth/Throat: Oropharyngeal exudate and posterior oropharyngeal erythema present.  Eyes: Pupils are equal, round, and reactive to light. Conjunctivae and EOM are normal.  Neck: Normal range of motion. Neck supple.  Cardiovascular: Normal rate, regular rhythm, normal heart sounds and intact distal pulses.  Pulmonary/Chest: Effort normal. She has wheezes in the right upper field and the left upper field.  Abdominal: Soft. Bowel sounds are normal.  Neurological: She is alert and oriented to person, place, and time. She has normal reflexes.  Skin: Skin is warm and dry. No rash noted.  Psychiatric: She has a normal mood and affect. Her behavior is normal. Judgment and thought content normal.        Assessment & Plan:   1. Chronic right-sided low back pain without sciatica - oxyCODONE-acetaminophen  (PERCOCET) 10-325 MG tablet; Take 1 tablet by mouth every 4 (four) hours as needed.  Dispense: 180 tablet; Refill: 0 - oxyCODONE-acetaminophen (PERCOCET) 10-325 MG tablet; Take 1 tablet by mouth every 4 (four) hours as needed for pain.  Dispense: 180 tablet; Refill: 0 - oxyCODONE-acetaminophen (PERCOCET) 10-325 MG tablet; Take 1 tablet by mouth every 4 (four) hours as needed for pain.  Dispense: 180 tablet; Refill: 0  2. Depression, major, single episode, moderate (HCC) - citalopram (CELEXA) 40 MG tablet; Take 1 tablet (40 mg total) by mouth daily.  Dispense: 30 tablet; Refill: 11  3. Bronchitis   Continue all other maintenance medications as listed above.  Follow up plan: Return in about 3 months (around 06/14/2018) for recheck.  Educational handout given for survey  Remus Loffler PA-C [facility] [address]  [place], [place] [address] [phone]  03/16/2018, 12:42 PM

## 2018-03-25 ENCOUNTER — Telehealth: Payer: Self-pay | Admitting: Physician Assistant

## 2018-03-25 NOTE — Telephone Encounter (Signed)
May call pharmacy and approve refill

## 2018-03-25 NOTE — Telephone Encounter (Signed)
Pharmacy aware and states that ins will not cover any sooner than Tuesday. Patient aware.

## 2018-03-25 NOTE — Telephone Encounter (Signed)
Pt states she is in a lot of pain and wants to know if Angel cold call the pharmacy to see if she can get her   oxyCODONE-acetaminophen (PERCOCET) 10-325 MG tablet  Filled early, she states she is not suppose to get it filled until around Wednesday next week. [facility]

## 2018-06-14 ENCOUNTER — Ambulatory Visit: Payer: BLUE CROSS/BLUE SHIELD | Admitting: Physician Assistant

## 2018-06-14 ENCOUNTER — Encounter: Payer: Self-pay | Admitting: Physician Assistant

## 2018-06-14 DIAGNOSIS — G8929 Other chronic pain: Secondary | ICD-10-CM | POA: Diagnosis not present

## 2018-06-14 DIAGNOSIS — M545 Low back pain: Secondary | ICD-10-CM

## 2018-06-14 MED ORDER — OXYCODONE-ACETAMINOPHEN 10-325 MG PO TABS
1.0000 | ORAL_TABLET | ORAL | 0 refills | Status: DC | PRN
Start: 2018-06-14 — End: 2018-09-12

## 2018-06-14 MED ORDER — OXYCODONE-ACETAMINOPHEN 10-325 MG PO TABS: 1.0000 | ORAL_TABLET | ORAL | 0 refills | Status: DC | PRN

## 2018-06-14 NOTE — Patient Instructions (Signed)
In a few days you may receive a survey in the mail or online from [employer] regarding your visit with us today. Please take a moment to fill this out. Your feedback is very important to our whole office. It can help us better understand your needs as well as improve your experience and satisfaction. Thank you for taking your time to complete it. We care about you.  Kaliya Shreiner, PA-C  

## 2018-06-14 NOTE — Progress Notes (Signed)
BP (!) 143/70   Pulse 60   Temp (!) 96.2 F (35.7 C) (Oral)   Ht 5\' 5"  (1.651 m)   Wt 141 lb (64 kg)   BMI 23.46 kg/m    Subjective:    Patient ID: Ashley Marquez, female    DOB: [birth year], 57 y.o.   MRN: [id number]  HPI: Ashley MikesSandra E Koren is a 57 y.o. female presenting on 06/14/2018 for Pain (3 month ) This patient comes in for periodic recheck on medications and conditions including chronic back pain with joint pain. Having to pull on her mother a lot for positioning.   All medications are reviewed today. There are no reports of any problems with the medications. All of the medical conditions are reviewed and updated.  Lab work is reviewed and will be ordered as medically necessary. There are no new problems reported with today's visit.    Past Medical History:  Diagnosis Date  . Anxiety   . Heart murmur    Relevant past medical, surgical, family and social history reviewed and updated as indicated. Interim medical history since our last visit reviewed. Allergies and medications reviewed and updated. DATA REVIEWED: CHART IN EPIC  Family History reviewed for pertinent findings.  Review of Systems  Constitutional: Negative.   HENT: Negative.   Eyes: Negative.   Respiratory: Negative.   Gastrointestinal: Negative.   Genitourinary: Negative.     Allergies as of 06/14/2018      Reactions   Penicillins Nausea And Vomiting, Other (See Comments)   Patient slept for 2 days. Has patient had a PCN reaction causing immediate rash, facial/tongue/throat swelling, SOB or lightheadedness with hypotension: No Has patient had a PCN reaction causing severe rash involving mucus membranes or skin necrosis: No Has patient had a PCN reaction that required hospitalization No Has patient had a PCN reaction occurring within the last 10 years: No If all of the above answers are "NO", then may proceed with Cephalosporin use.      Medication List        Accurate as of 06/14/18  2:50 PM.  Always use your most recent med list.          cetirizine 10 MG tablet Commonly known as:  ZYRTEC Take 1 tablet (10 mg total) by mouth daily.   ibuprofen 200 MG tablet Commonly known as:  ADVIL,MOTRIN Take 400 mg by mouth every 4 (four) hours as needed for moderate pain.   lisinopril 5 MG tablet Commonly known as:  PRINIVIL,ZESTRIL Take 1 tablet (5 mg total) by mouth daily.   omeprazole 20 MG capsule Commonly known as:  PRILOSEC TAKE ONE (1) CAPSULE EACH DAY   oxyCODONE-acetaminophen 10-325 MG tablet Commonly known as:  PERCOCET Take 1 tablet by mouth every 4 (four) hours as needed.   oxyCODONE-acetaminophen 10-325 MG tablet Commonly known as:  PERCOCET Take 1 tablet by mouth every 4 (four) hours as needed for pain.   oxyCODONE-acetaminophen 10-325 MG tablet Commonly known as:  PERCOCET Take 1 tablet by mouth every 4 (four) hours as needed for pain.          Objective:    BP (!) 143/70   Pulse 60   Temp (!) 96.2 F (35.7 C) (Oral)   Ht 5\' 5"  (1.651 m)   Wt 141 lb (64 kg)   BMI 23.46 kg/m   Allergies  Allergen Reactions  . Penicillins Nausea And Vomiting and Other (See Comments)    Patient slept for 2 days.  Has patient had a PCN reaction causing immediate rash, facial/tongue/throat swelling, SOB or lightheadedness with hypotension: No Has patient had a PCN reaction causing severe rash involving mucus membranes or skin necrosis: No Has patient had a PCN reaction that required hospitalization No Has patient had a PCN reaction occurring within the last 10 years: No If all of the above answers are "NO", then may proceed with Cephalosporin use.     Wt Readings from Last 3 Encounters:  06/14/18 141 lb (64 kg)  03/15/18 141 lb (64 kg)  01/19/18 140 lb 6.4 oz (63.7 kg)    Physical Exam  Constitutional: She is oriented to person, place, and time. She appears well-developed and well-nourished.  HENT:  Head: Normocephalic and atraumatic.  Eyes: Pupils are  equal, round, and reactive to light. Conjunctivae and EOM are normal.  Cardiovascular: Normal rate, regular rhythm, normal heart sounds and intact distal pulses.  Pulmonary/Chest: Effort normal and breath sounds normal.  Abdominal: Soft. Bowel sounds are normal.  Neurological: She is alert and oriented to person, place, and time. She has normal reflexes.  Skin: Skin is warm and dry. No rash noted.  Psychiatric: She has a normal mood and affect. Her behavior is normal. Judgment and thought content normal.        Assessment & Plan:   1. Chronic right-sided low back pain without sciatica - oxyCODONE-acetaminophen (PERCOCET) 10-325 MG tablet; Take 1 tablet by mouth every 4 (four) hours as needed.  Dispense: 180 tablet; Refill: 0 - oxyCODONE-acetaminophen (PERCOCET) 10-325 MG tablet; Take 1 tablet by mouth every 4 (four) hours as needed for pain.  Dispense: 180 tablet; Refill: 0 - oxyCODONE-acetaminophen (PERCOCET) 10-325 MG tablet; Take 1 tablet by mouth every 4 (four) hours as needed for pain.  Dispense: 180 tablet; Refill: 0   Continue all other maintenance medications as listed above.  Follow up plan: Return in about 3 months (around 09/14/2018) for recheck.  Educational handout given for survey  Remus Loffler PA-C [facility] [address]  [place], [place] [address] [phone]   06/14/2018, 2:50 PM

## 2018-07-26 ENCOUNTER — Other Ambulatory Visit: Payer: Self-pay | Admitting: Physician Assistant

## 2018-07-26 MED ORDER — LEVOFLOXACIN 500 MG PO TABS: 500.0000 mg | ORAL_TABLET | Freq: Every day | ORAL | 0 refills | Status: DC

## 2018-07-26 NOTE — Telephone Encounter (Signed)
Patient aware.

## 2018-07-26 NOTE — Telephone Encounter (Signed)
Levaquin 500 mg 1 daily is sent to the pharmacy.

## 2018-08-11 ENCOUNTER — Telehealth: Payer: Self-pay | Admitting: Physician Assistant

## 2018-08-11 NOTE — Telephone Encounter (Signed)
Okay to fill today?

## 2018-08-11 NOTE — Telephone Encounter (Signed)
Pharmacy aware

## 2018-08-29 ENCOUNTER — Encounter: Payer: Self-pay | Admitting: Physician Assistant

## 2018-08-29 ENCOUNTER — Ambulatory Visit: Payer: BLUE CROSS/BLUE SHIELD | Admitting: Physician Assistant

## 2018-08-29 DIAGNOSIS — R030 Elevated blood-pressure reading, without diagnosis of hypertension: Secondary | ICD-10-CM | POA: Diagnosis not present

## 2018-08-29 MED ORDER — LISINOPRIL 10 MG PO TABS: 10.0000 mg | ORAL_TABLET | Freq: Every day | ORAL | 1 refills | Status: DC

## 2018-08-29 NOTE — Progress Notes (Signed)
BP (!) 159/84   Pulse 66   Temp (!) 97.1 F (36.2 C) (Oral)   Ht 5\' 5"  (1.651 m)   Wt 139 lb 12.8 oz (63.4 kg)   BMI 23.26 kg/m    Subjective:    Patient ID: Ashley Marquez, female    DOB: 12/19/1960, 57 y.o.   MRN: [id number]  HPI: Ashley Marquez is a 57 y.o. female presenting on 08/29/2018 for Headache and Hypertension  This patient comes in having had a headache over the past 3 days.  She has had an issue with her blood pressure being slightly in the past.  Saturday when the patient was up she had a significant headache.  She is under stress.  She is planning changing jobs in the very near future.  Think this would be an ideal thing for her to do.  She denies any other neurologic deficits.  There is no chest pain or shortness of breath.  Past Medical History:  Diagnosis Date  . Anxiety   . Heart murmur    Relevant past medical, surgical, family and social history reviewed and updated as indicated. Interim medical history since our last visit reviewed. Allergies and medications reviewed and updated. DATA REVIEWED: CHART IN EPIC  Family History reviewed for pertinent findings.  Review of Systems  Constitutional: Negative.  Negative for activity change, fatigue and fever.  HENT: Negative.   Eyes: Negative.   Respiratory: Negative.  Negative for cough.   Cardiovascular: Negative.  Negative for chest pain.  Gastrointestinal: Negative.  Negative for abdominal pain.  Endocrine: Negative.   Genitourinary: Negative.  Negative for dysuria.  Musculoskeletal: Negative.   Skin: Negative.   Neurological: Positive for headaches. Negative for weakness and numbness.    Allergies as of 08/29/2018      Reactions   Penicillins Nausea And Vomiting, Other (See Comments)   Patient slept for 2 days. Has patient had a PCN reaction causing immediate rash, facial/tongue/throat swelling, SOB or lightheadedness with hypotension: No Has patient had a PCN reaction causing severe rash  involving mucus membranes or skin necrosis: No Has patient had a PCN reaction that required hospitalization No Has patient had a PCN reaction occurring within the last 10 years: No If all of the above answers are "NO", then may proceed with Cephalosporin use.      Medication List        Accurate as of 08/29/18  3:52 PM. Always use your most recent med list.          cetirizine 10 MG tablet Commonly known as:  ZYRTEC Take 1 tablet (10 mg total) by mouth daily.   ibuprofen 200 MG tablet Commonly known as:  ADVIL,MOTRIN Take 400 mg by mouth every 4 (four) hours as needed for moderate pain.   lisinopril 10 MG tablet Commonly known as:  PRINIVIL,ZESTRIL Take 1 tablet (10 mg total) by mouth daily.   omeprazole 20 MG capsule Commonly known as:  PRILOSEC TAKE ONE (1) CAPSULE EACH DAY   oxyCODONE-acetaminophen 10-325 MG tablet Commonly known as:  PERCOCET Take 1 tablet by mouth every 4 (four) hours as needed.   oxyCODONE-acetaminophen 10-325 MG tablet Commonly known as:  PERCOCET Take 1 tablet by mouth every 4 (four) hours as needed for pain.   oxyCODONE-acetaminophen 10-325 MG tablet Commonly known as:  PERCOCET Take 1 tablet by mouth every 4 (four) hours as needed for pain.          Objective:  BP (!) 159/84   Pulse 66   Temp (!) 97.1 F (36.2 C) (Oral)   Ht 5\' 5"  (1.651 m)   Wt 139 lb 12.8 oz (63.4 kg)   BMI 23.26 kg/m   Allergies  Allergen Reactions  . Penicillins Nausea And Vomiting and Other (See Comments)    Patient slept for 2 days.  Has patient had a PCN reaction causing immediate rash, facial/tongue/throat swelling, SOB or lightheadedness with hypotension: No Has patient had a PCN reaction causing severe rash involving mucus membranes or skin necrosis: No Has patient had a PCN reaction that required hospitalization No Has patient had a PCN reaction occurring within the last 10 years: No If all of the above answers are "NO", then may proceed with  Cephalosporin use.     Wt Readings from Last 3 Encounters:  08/29/18 139 lb 12.8 oz (63.4 kg)  06/14/18 141 lb (64 kg)  03/15/18 141 lb (64 kg)    Physical Exam  Constitutional: She is oriented to person, place, and time. She appears well-developed and well-nourished.  HENT:  Head: Normocephalic and atraumatic.  Eyes: Pupils are equal, round, and reactive to light. Conjunctivae and EOM are normal.  Cardiovascular: Normal rate, regular rhythm, normal heart sounds and intact distal pulses.  Pulmonary/Chest: Effort normal and breath sounds normal.  Abdominal: Soft. Bowel sounds are normal.  Neurological: She is alert and oriented to person, place, and time. She has normal reflexes.  Skin: Skin is warm and dry. No rash noted.  Psychiatric: She has a normal mood and affect. Her behavior is normal. Judgment and thought content normal.        Assessment & Plan:   1. Elevated BP without diagnosis of hypertension - lisinopril (PRINIVIL,ZESTRIL) 10 MG tablet; Take 1 tablet (10 mg total) by mouth daily.  Dispense: 90 tablet; Refill: 1   Continue all other maintenance medications as listed above.  Follow up plan: Return in about 4 weeks (around 09/26/2018) for recheck bp.  Educational handout given for survey  Remus Loffler PA-C [facility] [address]  [place], [place] [address] [phone]   08/29/2018, 3:53 PM

## 2018-09-09 ENCOUNTER — Encounter: Payer: Self-pay | Admitting: Physician Assistant

## 2018-09-09 ENCOUNTER — Telehealth: Payer: Self-pay | Admitting: Physician Assistant

## 2018-09-09 ENCOUNTER — Ambulatory Visit: Payer: BLUE CROSS/BLUE SHIELD | Admitting: Physician Assistant

## 2018-09-09 VITALS — BP 133/78 | HR 69 | Temp 97.5°F | Ht 65.0 in | Wt 137.8 lb

## 2018-09-09 DIAGNOSIS — J020 Streptococcal pharyngitis: Secondary | ICD-10-CM

## 2018-09-09 DIAGNOSIS — M545 Low back pain: Secondary | ICD-10-CM

## 2018-09-09 DIAGNOSIS — G8929 Other chronic pain: Secondary | ICD-10-CM

## 2018-09-09 DIAGNOSIS — J0110 Acute frontal sinusitis, unspecified: Secondary | ICD-10-CM | POA: Diagnosis not present

## 2018-09-09 MED ORDER — AMOXICILLIN 250 MG/5ML PO SUSR: 500.0000 mg | Freq: Three times a day (TID) | ORAL | 0 refills | Status: DC

## 2018-09-09 NOTE — Telephone Encounter (Signed)
Patient aware that she will need separate appointment for pain medication refill.

## 2018-09-09 NOTE — Patient Instructions (Signed)

## 2018-09-12 MED ORDER — OXYCODONE-ACETAMINOPHEN 10-325 MG PO TABS: 1.0000 | ORAL_TABLET | ORAL | 0 refills | Status: DC | PRN

## 2018-09-12 NOTE — Progress Notes (Signed)
BP 133/78   Pulse 69   Temp (!) 97.5 F (36.4 C) (Oral)   Ht 5\' 5"  (1.651 m)   Wt 137 lb 12.8 oz (62.5 kg)   BMI 22.93 kg/m    Subjective:    Patient ID: Ashley Marquez, female    DOB: 11/24/1961, 57 y.o.   MRN: [id number]  HPI: Ashley Marquez is a 57 y.o. female presenting on 09/09/2018 for Sinusitis and Fever  This patient has sore throat and had less than 2 days severe fever, chills, myalgias.  Complains of sinus headache and postnasal drainage. There is copious drainage at times. Pain with swallowing, decreased appetite and headache.  Exposure to strep. This patient has had many days of sinus headache and postnasal drainage. There is copious drainage at times. Denies any fever at this time. There has been a history of sinus infections in the past.  No history of sinus surgery. There is cough at night. It has become more prevalent in recent days.   Past Medical History:  Diagnosis Date  . Anxiety   . Heart murmur    Relevant past medical, surgical, family and social history reviewed and updated as indicated. Interim medical history since our last visit reviewed. Allergies and medications reviewed and updated. DATA REVIEWED: CHART IN EPIC  Family History reviewed for pertinent findings.  Review of Systems  Constitutional: Negative.  Negative for activity change and appetite change.  HENT: Negative.   Eyes: Negative.   Respiratory: Negative.   Cardiovascular: Negative.  Negative for chest pain, palpitations and leg swelling.  Gastrointestinal: Negative.   Genitourinary: Negative.   Musculoskeletal: Negative.   Skin: Negative.   Neurological: Positive for headaches.    Allergies as of 09/09/2018      Reactions   Penicillins Nausea And Vomiting, Other (See Comments)   Patient slept for 2 days. Has patient had a PCN reaction causing immediate rash, facial/tongue/throat swelling, SOB or lightheadedness with hypotension: No Has patient had a PCN reaction causing severe  rash involving mucus membranes or skin necrosis: No Has patient had a PCN reaction that required hospitalization No Has patient had a PCN reaction occurring within the last 10 years: No If all of the above answers are "NO", then may proceed with Cephalosporin use.      Medication List        Accurate as of 09/09/18 11:59 PM. Always use your most recent med list.          amoxicillin 250 MG/5ML suspension Commonly known as:  AMOXIL Take 10 mLs (500 mg total) by mouth 3 (three) times daily.   cetirizine 10 MG tablet Commonly known as:  ZYRTEC Take 1 tablet (10 mg total) by mouth daily.   ibuprofen 200 MG tablet Commonly known as:  ADVIL,MOTRIN Take 400 mg by mouth every 4 (four) hours as needed for moderate pain.   lisinopril 10 MG tablet Commonly known as:  PRINIVIL,ZESTRIL Take 1 tablet (10 mg total) by mouth daily.   omeprazole 20 MG capsule Commonly known as:  PRILOSEC TAKE ONE (1) CAPSULE EACH DAY   oxyCODONE-acetaminophen 10-325 MG tablet Commonly known as:  PERCOCET Take 1 tablet by mouth every 4 (four) hours as needed.   oxyCODONE-acetaminophen 10-325 MG tablet Commonly known as:  PERCOCET Take 1 tablet by mouth every 4 (four) hours as needed for pain.   oxyCODONE-acetaminophen 10-325 MG tablet Commonly known as:  PERCOCET Take 1 tablet by mouth every 4 (four) hours as needed for pain.  Objective:    BP 133/78   Pulse 69   Temp (!) 97.5 F (36.4 C) (Oral)   Ht 5\' 5"  (1.651 m)   Wt 137 lb 12.8 oz (62.5 kg)   BMI 22.93 kg/m   Allergies  Allergen Reactions  . Penicillins Nausea And Vomiting and Other (See Comments)    Patient slept for 2 days.  Has patient had a PCN reaction causing immediate rash, facial/tongue/throat swelling, SOB or lightheadedness with hypotension: No Has patient had a PCN reaction causing severe rash involving mucus membranes or skin necrosis: No Has patient had a PCN reaction that required hospitalization No Has  patient had a PCN reaction occurring within the last 10 years: No If all of the above answers are "NO", then may proceed with Cephalosporin use.     Wt Readings from Last 3 Encounters:  09/09/18 137 lb 12.8 oz (62.5 kg)  08/29/18 139 lb 12.8 oz (63.4 kg)  06/14/18 141 lb (64 kg)    Physical Exam  Constitutional: She is oriented to person, place, and time. She appears well-developed and well-nourished.  HENT:  Head: Normocephalic and atraumatic.  Right Ear: Tympanic membrane, external ear and ear canal normal. No middle ear effusion.  Left Ear: Tympanic membrane, external ear and ear canal normal.  No middle ear effusion.  Nose: Mucosal edema and rhinorrhea present. Right sinus exhibits no maxillary sinus tenderness and no frontal sinus tenderness. Left sinus exhibits no maxillary sinus tenderness and no frontal sinus tenderness.  Mouth/Throat: Uvula is midline. Oropharyngeal exudate, posterior oropharyngeal edema and posterior oropharyngeal erythema present. No tonsillar abscesses.  Eyes: Pupils are equal, round, and reactive to light. Conjunctivae and EOM are normal. Right eye exhibits no discharge. Left eye exhibits no discharge.  Neck: Normal range of motion.  Cardiovascular: Normal rate, regular rhythm, normal heart sounds and intact distal pulses.  Pulmonary/Chest: Effort normal and breath sounds normal. No respiratory distress. She has no wheezes.  Abdominal: Soft. Bowel sounds are normal.  Lymphadenopathy:    She has no cervical adenopathy.  Neurological: She is alert and oriented to person, place, and time. She has normal reflexes.  Skin: Skin is warm and dry. No rash noted.  Psychiatric: She has a normal mood and affect. Her behavior is normal. Judgment and thought content normal.  Nursing note and vitals reviewed.       Assessment & Plan:   1. Strep pharyngitis - amoxicillin (AMOXIL) 250 MG/5ML suspension; Take 10 mLs (500 mg total) by mouth 3 (three) times daily.   Dispense: 300 mL; Refill: 0  2. Acute non-recurrent frontal sinusitis - amoxicillin (AMOXIL) 250 MG/5ML suspension; Take 10 mLs (500 mg total) by mouth 3 (three) times daily.  Dispense: 300 mL; Refill: 0   Continue all other maintenance medications as listed above.  Follow up plan: No follow-ups on file.  Educational handout given for survey  Remus Loffler PA-C [facility] [address]  [place], [place] [address] [phone]   09/12/2018, 7:30 PM

## 2018-09-16 ENCOUNTER — Encounter: Payer: Self-pay | Admitting: Physician Assistant

## 2018-09-16 ENCOUNTER — Ambulatory Visit: Payer: BLUE CROSS/BLUE SHIELD | Admitting: Physician Assistant

## 2018-09-16 VITALS — BP 133/70 | HR 67 | Temp 98.0°F | Ht 65.0 in | Wt 140.0 lb

## 2018-09-16 DIAGNOSIS — M6740 Ganglion, unspecified site: Secondary | ICD-10-CM | POA: Diagnosis not present

## 2018-09-16 DIAGNOSIS — J40 Bronchitis, not specified as acute or chronic: Secondary | ICD-10-CM

## 2018-09-16 MED ORDER — AZITHROMYCIN 250 MG PO TABS: ORAL_TABLET | ORAL | 0 refills | Status: DC

## 2018-09-16 MED ORDER — PREDNISONE 10 MG (21) PO TBPK: ORAL_TABLET | ORAL | 0 refills | Status: DC

## 2018-09-16 NOTE — Progress Notes (Signed)
BP 133/70   Pulse 67   Temp 98 F (36.7 C) (Oral)   Ht 5\' 5"  (1.651 m)   Wt 140 lb (63.5 kg)   BMI 23.30 kg/m    Subjective:    Patient ID: Ashley Marquez, female    DOB: [birth year], 57 y.o.   MRN: [id number]  HPI: Ashley Marquez is a 57 y.o. female presenting on 09/16/2018 for Pain  Patient with several days of progressing upper respiratory and bronchial symptoms. Initially there was more upper respiratory congestion. This progressed to having significant cough that is productive throughout the day and severe at night. There is occasional wheezing after coughing. Sometimes there is slight dyspnea on exertion. It is productive mucus that is yellow in color. Denies any blood.  NEW narcotic contract is reviewed and signed at today's visit. She reports doing well with her pain medication and has been in good standing  Past Medical History:  Diagnosis Date  . Anxiety   . Heart murmur    Relevant past medical, surgical, family and social history reviewed and updated as indicated. Interim medical history since our last visit reviewed. Allergies and medications reviewed and updated. DATA REVIEWED: CHART IN EPIC  Family History reviewed for pertinent findings.  Review of Systems  Constitutional: Positive for chills and fatigue. Negative for activity change and appetite change.  HENT: Positive for congestion, postnasal drip and sore throat.   Eyes: Negative.   Respiratory: Positive for cough and wheezing.   Cardiovascular: Negative.  Negative for chest pain, palpitations and leg swelling.  Gastrointestinal: Negative.   Genitourinary: Negative.   Musculoskeletal: Positive for arthralgias, back pain, gait problem and joint swelling.  Skin: Negative.   Neurological: Positive for headaches.    Allergies as of 09/16/2018      Reactions   Penicillins Nausea And Vomiting, Other (See Comments)   Patient slept for 2 days. Has patient had a PCN reaction causing immediate rash,  facial/tongue/throat swelling, SOB or lightheadedness with hypotension: No Has patient had a PCN reaction causing severe rash involving mucus membranes or skin necrosis: No Has patient had a PCN reaction that required hospitalization No Has patient had a PCN reaction occurring within the last 10 years: No If all of the above answers are "NO", then may proceed with Cephalosporin use.      Medication List        Accurate as of 09/16/18  3:04 PM. Always use your most recent med list.          azithromycin 250 MG tablet Commonly known as:  ZITHROMAX Take as directed   cetirizine 10 MG tablet Commonly known as:  ZYRTEC Take 1 tablet (10 mg total) by mouth daily.   ibuprofen 200 MG tablet Commonly known as:  ADVIL,MOTRIN Take 400 mg by mouth every 4 (four) hours as needed for moderate pain.   lisinopril 10 MG tablet Commonly known as:  PRINIVIL,ZESTRIL Take 1 tablet (10 mg total) by mouth daily.   omeprazole 20 MG capsule Commonly known as:  PRILOSEC TAKE ONE (1) CAPSULE EACH DAY   oxyCODONE-acetaminophen 10-325 MG tablet Commonly known as:  PERCOCET Take 1 tablet by mouth every 4 (four) hours as needed.   oxyCODONE-acetaminophen 10-325 MG tablet Commonly known as:  PERCOCET Take 1 tablet by mouth every 4 (four) hours as needed for pain.   oxyCODONE-acetaminophen 10-325 MG tablet Commonly known as:  PERCOCET Take 1 tablet by mouth every 4 (four) hours as needed for pain.  predniSONE 10 MG (21) Tbpk tablet Commonly known as:  STERAPRED UNI-PAK 21 TAB As directed x 6 days          Objective:    BP 133/70   Pulse 67   Temp 98 F (36.7 C) (Oral)   Ht 5\' 5"  (1.651 m)   Wt 140 lb (63.5 kg)   BMI 23.30 kg/m   Allergies  Allergen Reactions  . Penicillins Nausea And Vomiting and Other (See Comments)    Patient slept for 2 days.  Has patient had a PCN reaction causing immediate rash, facial/tongue/throat swelling, SOB or lightheadedness with hypotension: No Has  patient had a PCN reaction causing severe rash involving mucus membranes or skin necrosis: No Has patient had a PCN reaction that required hospitalization No Has patient had a PCN reaction occurring within the last 10 years: No If all of the above answers are "NO", then may proceed with Cephalosporin use.     Wt Readings from Last 3 Encounters:  09/16/18 140 lb (63.5 kg)  09/09/18 137 lb 12.8 oz (62.5 kg)  08/29/18 139 lb 12.8 oz (63.4 kg)    Physical Exam  Constitutional: She is oriented to person, place, and time. She appears well-developed and well-nourished.  HENT:  Head: Normocephalic and atraumatic.  Right Ear: There is drainage and tenderness.  Left Ear: There is drainage and tenderness.  Nose: Mucosal edema and rhinorrhea present. Right sinus exhibits no maxillary sinus tenderness and no frontal sinus tenderness. Left sinus exhibits no maxillary sinus tenderness and no frontal sinus tenderness.  Mouth/Throat: Oropharyngeal exudate and posterior oropharyngeal erythema present.  Eyes: Pupils are equal, round, and reactive to light. Conjunctivae and EOM are normal.  Neck: Normal range of motion. Neck supple.  Cardiovascular: Normal rate, regular rhythm, normal heart sounds and intact distal pulses.  Pulmonary/Chest: Effort normal. She has wheezes in the right upper field and the left upper field.  Abdominal: Soft. Bowel sounds are normal.  Musculoskeletal:       Left knee: She exhibits decreased range of motion, swelling and deformity. Tenderness found.       Right hand: She exhibits deformity.       Hands: Cyst on right hand  Neurological: She is alert and oriented to person, place, and time. She has normal reflexes.  Skin: Skin is warm and dry. No rash noted.  Psychiatric: She has a normal mood and affect. Her behavior is normal. Judgment and thought content normal.    Results for orders placed or performed in visit on 01/19/18  Veritor Flu A/B Waived  Result Value Ref  Range   Influenza A Positive (A) Negative   Influenza B Negative Negative      Assessment & Plan:   1. Ganglion cyst Ice and rest  2. Bronchitis - azithromycin (ZITHROMAX Z-PAK) 250 MG tablet; Take as directed  Dispense: 6 each; Refill: 0 - predniSONE (STERAPRED UNI-PAK 21 TAB) 10 MG (21) TBPK tablet; As directed x 6 days  Dispense: 21 tablet; Refill: 0   Continue all other maintenance medications as listed above.  Follow up plan: Return in about 3 months (around 12/17/2018).  Educational handout given for survey  Remus Loffler PA-C [facility] [address]  [place], [place] [address] [phone]   09/16/2018, 3:04 PM

## 2018-09-16 NOTE — Patient Instructions (Signed)
In a few days you may receive a survey in the mail or online from [employer] regarding your visit with us today. Please take a moment to fill this out. Your feedback is very important to our whole office. It can help us better understand your needs as well as improve your experience and satisfaction. Thank you for taking your time to complete it. We care about you.  Ladamien Rammel, PA-C  

## 2018-10-21 ENCOUNTER — Ambulatory Visit: Payer: BLUE CROSS/BLUE SHIELD | Admitting: Family Medicine

## 2018-10-21 ENCOUNTER — Encounter: Payer: Self-pay | Admitting: Family Medicine

## 2018-10-21 VITALS — BP 155/82 | HR 66 | Temp 97.5°F | Ht 65.0 in | Wt 141.0 lb

## 2018-10-21 DIAGNOSIS — J310 Chronic rhinitis: Secondary | ICD-10-CM

## 2018-10-21 DIAGNOSIS — J329 Chronic sinusitis, unspecified: Secondary | ICD-10-CM | POA: Diagnosis not present

## 2018-10-21 MED ORDER — CEFDINIR 300 MG PO CAPS: 300.0000 mg | ORAL_CAPSULE | Freq: Two times a day (BID) | ORAL | 0 refills | Status: DC

## 2018-10-21 MED ORDER — FLUTICASONE PROPIONATE 50 MCG/ACT NA SUSP: 2.0000 | Freq: Every day | NASAL | 6 refills | Status: DC

## 2018-10-21 NOTE — Progress Notes (Signed)
Subjective: CC: Cough PCP: Remus Loffler, PA-C ION:GEXBMW Ashley Marquez is a 57 y.o. female presenting to clinic today for:  1. Cough Patient reports cough, congestion and nasal soreness for about 1 month now.  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  She has been treated with a Z-Pak, uses Zyrtec.  She uses humidification on her wood stove at home.  She notes no use of any nasal sprays or nasal saline rinses.  She is a non-smoker but is exposed to secondhand smoke.  No fevers.  She describes the sinus drainage as green and thick.  She reports associated frontal headache.   ROS: Per HPI  Allergies  Allergen Reactions  . Penicillins Nausea And Vomiting and Other (See Comments)    Patient slept for 2 days.  Has patient had a PCN reaction causing immediate rash, facial/tongue/throat swelling, SOB or lightheadedness with hypotension: No Has patient had a PCN reaction causing severe rash involving mucus membranes or skin necrosis: No Has patient had a PCN reaction that required hospitalization No Has patient had a PCN reaction occurring within the last 10 years: No If all of the above answers are "NO", then may proceed with Cephalosporin use.    Past Medical History:  Diagnosis Date  . Anxiety   . Heart murmur     Current Outpatient Medications:  .  ibuprofen (ADVIL,MOTRIN) 200 MG tablet, Take 400 mg by mouth every 4 (four) hours as needed for moderate pain. , Disp: , Rfl:  .  lisinopril (PRINIVIL,ZESTRIL) 10 MG tablet, Take 1 tablet (10 mg total) by mouth daily., Disp: 90 tablet, Rfl: 1 .  omeprazole (PRILOSEC) 20 MG capsule, TAKE ONE (1) CAPSULE EACH DAY, Disp: 90 capsule, Rfl: 3 .  oxyCODONE-acetaminophen (PERCOCET) 10-325 MG tablet, Take 1 tablet by mouth every 4 (four) hours as needed., Disp: 180 tablet, Rfl: 0 .  oxyCODONE-acetaminophen (PERCOCET) 10-325 MG tablet, Take 1 tablet by mouth every 4 (four) hours as needed for pain. (Patient not taking:  Reported on 10/21/2018), Disp: 180 tablet, Rfl: 0 .  oxyCODONE-acetaminophen (PERCOCET) 10-325 MG tablet, Take 1 tablet by mouth every 4 (four) hours as needed for pain. (Patient not taking: Reported on 10/21/2018), Disp: 180 tablet, Rfl: 0 Social History   Socioeconomic History  . Marital status: Divorced    Spouse name: Not on file  . Number of children: 2  . Years of education: Not on file  . Highest education level: Not on file  Occupational History  . Occupation: Two jobs  Engineer, production  . Financial resource strain: Not on file  . Food insecurity:    Worry: Not on file    Inability: Not on file  . Transportation needs:    Medical: Not on file    Non-medical: Not on file  Tobacco Use  . Smoking status: Never Smoker  . Smokeless tobacco: Never Used  Substance and Sexual Activity  . Alcohol use: No  . Drug use: No  . Sexual activity: Not on file  Lifestyle  . Physical activity:    Days per week: Not on file    Minutes per session: Not on file  . Stress: Not on file  Relationships  . Social connections:    Talks on phone: Not on file    Gets together: Not on file    Attends religious service: Not on file    Active member of club or organization: Not on file  Attends meetings of clubs or organizations: Not on file    Relationship status: Not on file  . Intimate partner violence:    Fear of current or ex partner: Not on file    Emotionally abused: Not on file    Physically abused: Not on file    Forced sexual activity: Not on file  Other Topics Concern  . Not on file  Social History Narrative  . Not on file   Family History  Problem Relation Age of Onset  . Emphysema Father 9974    Objective: Office vital signs reviewed. BP (!) 155/82   Pulse 66   Temp (!) 97.5 F (36.4 C) (Oral)   Ht 5\' 5"  (1.651 m)   Wt 141 lb (64 kg)   BMI 23.46 kg/m   Physical Examination:  General: Awake, alert, well nourished, No acute distress HEENT: Mild tenderness to  palpation to the maxillary sinuses.    Neck: No masses palpated. No lymphadenopathy    Ears: Tympanic membranes intact, normal light reflex, no erythema, no bulging    Eyes: PERRLA, extraocular membranes intact, sclera Robers    Nose: nasal turbinates moist, opaque nasal discharge.  Both erythema and edema noted of the nasal turbinates.    Throat: moist mucus membranes, no erythema, no tonsillar exudate.  Airway is patent Cardio: regular rate and rhythm, S1S2 heard, no murmurs appreciated Pulm: clear to auscultation bilaterally, no wheezes, rhonchi or rales; normal work of breathing on room air  Assessment/ Plan: 57 y.o. female   1. Rhinosinusitis Patient is afebrile nontoxic-appearing.  Physical exam is remarkable for mild tenderness to palpation to the maxillary sinuses.  She has edema and erythema to the nasal turbinates.  Given duration of symptoms and increasing severity, I have empirically treated for acute bacterial sinusitis with Omnicef p.o. twice daily for the next 10 days.  Start Flonase.  Use nasal saline multiple times daily while at work.  Home care instructions reviewed with patient.  Reasons for return discussed.  She will contact us should she decide to see an ear nose and throat if symptoms are not improving.   No orders of the defined types were placed in this encounter.  Meds ordered this encounter  Medications  . cefdinir (OMNICEF) 300 MG capsule    Sig: Take 1 capsule (300 mg total) by mouth 2 (two) times daily. 1 po BID    Dispense:  20 capsule    Refill:  0  . fluticasone (FLONASE) 50 MCG/ACT nasal spray    Sig: Place 2 sprays into both nostrils daily.    Dispense:  16 g    Refill:  6     Ashly Hulen SkainsM Gottschalk, DO [facility] [phone]

## 2018-10-21 NOTE — Patient Instructions (Signed)
We are empirically treating you with an oral antibiotic called Omnicef to take twice daily for the next 10 days.  I have added Flonase nasal spray.  Continue Zyrtec.  I also recommend that you obtain nasal saline gel or mist over-the-counter to use while you are at work.  This will keep your nasal passages moist and to help reduce risk of bleeding.  If your symptoms persist or get worse, please return for reevaluation.  If you decide that you are interested in seeing the ear nose and throat doctor, contact me and I will place the referral.   Sinusitis, Adult Sinusitis is soreness and inflammation of your sinuses. Sinuses are hollow spaces in the bones around your face. Your sinuses are located:  Around your eyes.  In the middle of your forehead.  Behind your nose.  In your cheekbones.  Your sinuses and nasal passages are lined with a stringy fluid (mucus). Mucus normally drains out of your sinuses. When your nasal tissues become inflamed or swollen, the mucus can become trapped or blocked so air cannot flow through your sinuses. This allows bacteria, viruses, and funguses to grow, which leads to infection. Sinusitis can develop quickly and last for 7?10 days (acute) or for more than 12 weeks (chronic). Sinusitis often develops after a cold. What are the causes? This condition is caused by anything that creates swelling in the sinuses or stops mucus from draining, including:  Allergies.  Asthma.  Bacterial or viral infection.  Abnormally shaped bones between the nasal passages.  Nasal growths that contain mucus (nasal polyps).  Narrow sinus openings.  Pollutants, such as chemicals or irritants in the air.  A foreign object stuck in the nose.  A fungal infection. This is rare.  What increases the risk? The following factors may make you more likely to develop this condition:  Having allergies or asthma.  Having had a recent cold or respiratory tract infection.  Having  structural deformities or blockages in your nose or sinuses.  Having a weak immune system.  Doing a lot of swimming or diving.  Overusing nasal sprays.  Smoking.  What are the signs or symptoms? The main symptoms of this condition are pain and a feeling of pressure around the affected sinuses. Other symptoms include:  Upper toothache.  Earache.  Headache.  Bad breath.  Decreased sense of smell and taste.  A cough that may get worse at night.  Fatigue.  Fever.  Thick drainage from your nose. The drainage is often green and it may contain pus (purulent).  Stuffy nose or congestion.  Postnasal drip. This is when extra mucus collects in the throat or back of the nose.  Swelling and warmth over the affected sinuses.  Sore throat.  Sensitivity to light.  How is this diagnosed? This condition is diagnosed based on symptoms, a medical history, and a physical exam. To find out if your condition is acute or chronic, your health care provider may:  Look in your nose for signs of nasal polyps.  Tap over the affected sinus to check for signs of infection.  View the inside of your sinuses using an imaging device that has a light attached (endoscope).  If your health care provider suspects that you have chronic sinusitis, you may also:  Be tested for allergies.  Have a sample of mucus taken from your nose (nasal culture) and checked for bacteria.  Have a mucus sample examined to see if your sinusitis is related to an allergy.  If your sinusitis does not respond to treatment and it lasts longer than 8 weeks, you may have an MRI or CT scan to check your sinuses. These scans also help to determine how severe your infection is. In rare cases, a bone biopsy may be done to rule out more serious types of fungal sinus disease. How is this treated? Treatment for sinusitis depends on the cause and whether your condition is chronic or acute. If a virus is causing your sinusitis,  your symptoms will go away on their own within 10 days. You may be given medicines to relieve your symptoms, including:  Topical nasal decongestants. They shrink swollen nasal passages and let mucus drain from your sinuses.  Antihistamines. These drugs block inflammation that is triggered by allergies. This can help to ease swelling in your nose and sinuses.  Topical nasal corticosteroids. These are nasal sprays that ease inflammation and swelling in your nose and sinuses.  Nasal saline washes. These rinses can help to get rid of thick mucus in your nose.  If your condition is caused by bacteria, you will be given an antibiotic medicine. If your condition is caused by a fungus, you will be given an antifungal medicine. Surgery may be needed to correct underlying conditions, such as narrow nasal passages. Surgery may also be needed to remove polyps. Follow these instructions at home: Medicines  Take, use, or apply over-the-counter and prescription medicines only as told by your health care provider. These may include nasal sprays.  If you were prescribed an antibiotic medicine, take it as told by your health care provider. Do not stop taking the antibiotic even if you start to feel better. Hydrate and Humidify  Drink enough water to keep your urine clear or pale yellow. Staying hydrated will help to thin your mucus.  Use a cool mist humidifier to keep the humidity level in your home above 50%.  Inhale steam for 10-15 minutes, 3-4 times a day or as told by your health care provider. You can do this in the bathroom while a hot shower is running.  Limit your exposure to cool or dry air. Rest  Rest as much as possible.  Sleep with your head raised (elevated).  Make sure to get enough sleep each night. General instructions  Apply a warm, moist washcloth to your face 3-4 times a day or as told by your health care provider. This will help with discomfort.  Wash your hands often with  soap and water to reduce your exposure to viruses and other germs. If soap and water are not available, use hand sanitizer.  Do not smoke. Avoid being around people who are smoking (secondhand smoke).  Keep all follow-up visits as told by your health care provider. This is important. Contact a health care provider if:  You have a fever.  Your symptoms get worse.  Your symptoms do not improve within 10 days. Get help right away if:  You have a severe headache.  You have persistent vomiting.  You have pain or swelling around your face or eyes.  You have vision problems.  You develop confusion.  Your neck is stiff.  You have trouble breathing. This information is not intended to replace advice given to you by your health care provider. Make sure you discuss any questions you have with your health care provider. Document Released: 11/16/2005 Document Revised: 07/12/2016 Document Reviewed: 09/11/2015 Elsevier Interactive Patient Education  [employer]2018 [employer]

## 2018-12-02 ENCOUNTER — Telehealth: Payer: Self-pay | Admitting: Physician Assistant

## 2018-12-02 ENCOUNTER — Encounter: Payer: Self-pay | Admitting: Family Medicine

## 2018-12-02 ENCOUNTER — Ambulatory Visit: Payer: BLUE CROSS/BLUE SHIELD | Admitting: Family Medicine

## 2018-12-02 ENCOUNTER — Ambulatory Visit (INDEPENDENT_AMBULATORY_CARE_PROVIDER_SITE_OTHER): Payer: PRIVATE HEALTH INSURANCE | Admitting: Family Medicine

## 2018-12-02 VITALS — BP 150/82 | HR 66 | Temp 96.4°F | Ht 65.0 in | Wt 141.0 lb

## 2018-12-02 DIAGNOSIS — J320 Chronic maxillary sinusitis: Secondary | ICD-10-CM

## 2018-12-02 MED ORDER — LORATADINE 10 MG PO TABS: 10.0000 mg | ORAL_TABLET | Freq: Every day | ORAL | 11 refills | Status: DC
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
## 2018-12-02 NOTE — Telephone Encounter (Signed)
Pt scheduled in after hours clinic today at 6:30.

## 2018-12-14 ENCOUNTER — Encounter: Payer: Self-pay | Admitting: Physician Assistant

## 2018-12-14 ENCOUNTER — Ambulatory Visit (INDEPENDENT_AMBULATORY_CARE_PROVIDER_SITE_OTHER): Payer: PRIVATE HEALTH INSURANCE | Admitting: Physician Assistant

## 2018-12-14 DIAGNOSIS — R030 Elevated blood-pressure reading, without diagnosis of hypertension: Secondary | ICD-10-CM | POA: Diagnosis not present

## 2018-12-14 DIAGNOSIS — M545 Low back pain, unspecified: Secondary | ICD-10-CM

## 2018-12-14 DIAGNOSIS — G8929 Other chronic pain: Secondary | ICD-10-CM

## 2018-12-14 DIAGNOSIS — J320 Chronic maxillary sinusitis: Secondary | ICD-10-CM

## 2018-12-14 MED ORDER — OXYCODONE-ACETAMINOPHEN 10-325 MG PO TABS: 1.0000 | ORAL_TABLET | ORAL | 0 refills | Status: DC | PRN

## 2018-12-14 MED ORDER — AZITHROMYCIN 250 MG PO TABS: ORAL_TABLET | ORAL | 0 refills | Status: DC

## 2018-12-14 MED ORDER — DOXYCYCLINE HYCLATE 100 MG PO TABS: 100.0000 mg | ORAL_TABLET | Freq: Two times a day (BID) | ORAL | 0 refills | Status: DC

## 2018-12-14 MED ORDER — PREDNISONE 10 MG (21) PO TBPK: ORAL_TABLET | ORAL | 0 refills | Status: DC

## 2018-12-14 MED ORDER — LORATADINE 10 MG PO TABS: 10.0000 mg | ORAL_TABLET | Freq: Every day | ORAL | 3 refills | Status: DC

## 2018-12-14 MED ORDER — OMEPRAZOLE 20 MG PO CPDR: DELAYED_RELEASE_CAPSULE | ORAL | 3 refills | Status: DC

## 2018-12-14 MED ORDER — LISINOPRIL 20 MG PO TABS: 10.0000 mg | ORAL_TABLET | Freq: Every day | ORAL | 3 refills | Status: DC

## 2018-12-15 ENCOUNTER — Telehealth: Payer: Self-pay | Admitting: Physician Assistant

## 2018-12-15 ENCOUNTER — Other Ambulatory Visit: Payer: Self-pay | Admitting: Physician Assistant

## 2018-12-15 DIAGNOSIS — M545 Low back pain, unspecified: Secondary | ICD-10-CM

## 2018-12-15 DIAGNOSIS — G8929 Other chronic pain: Secondary | ICD-10-CM

## 2018-12-15 MED ORDER — OXYCODONE-ACETAMINOPHEN 10-325 MG PO TABS: 1.0000 | ORAL_TABLET | ORAL | 0 refills | Status: DC | PRN

## 2018-12-15 NOTE — Telephone Encounter (Signed)
sent 
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
## 2018-12-21 ENCOUNTER — Encounter: Payer: Self-pay | Admitting: Physician Assistant

## 2018-12-21 ENCOUNTER — Ambulatory Visit: Payer: PRIVATE HEALTH INSURANCE | Admitting: Physician Assistant

## 2018-12-21 ENCOUNTER — Ambulatory Visit (INDEPENDENT_AMBULATORY_CARE_PROVIDER_SITE_OTHER): Payer: PRIVATE HEALTH INSURANCE

## 2018-12-21 VITALS — BP 135/75 | HR 61 | Temp 97.0°F | Ht 65.0 in | Wt 139.8 lb

## 2018-12-21 DIAGNOSIS — S3992XA Unspecified injury of lower back, initial encounter: Secondary | ICD-10-CM | POA: Diagnosis not present

## 2018-12-21 MED ORDER — PREDNISONE 10 MG (48) PO TBPK: ORAL_TABLET | ORAL | 0 refills | Status: DC

## 2018-12-21 MED ORDER — METAXALONE 400 MG PO TABS: 400.0000 mg | ORAL_TABLET | Freq: Three times a day (TID) | ORAL | 2 refills | Status: DC
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
## 2018-12-21 NOTE — Patient Instructions (Signed)

## 2019-01-05 ENCOUNTER — Ambulatory Visit: Payer: PRIVATE HEALTH INSURANCE | Admitting: Physician Assistant

## 2019-01-05 ENCOUNTER — Encounter: Payer: Self-pay | Admitting: Physician Assistant

## 2019-01-05 VITALS — BP 138/69 | HR 72 | Temp 96.8°F | Ht 65.0 in | Wt 141.0 lb

## 2019-01-05 DIAGNOSIS — M5416 Radiculopathy, lumbar region: Secondary | ICD-10-CM

## 2019-01-05 DIAGNOSIS — M533 Sacrococcygeal disorders, not elsewhere classified: Secondary | ICD-10-CM | POA: Diagnosis not present

## 2019-01-05 MED ORDER — PREDNISONE 10 MG PO TABS: 10.0000 mg | ORAL_TABLET | Freq: Every day | ORAL | 0 refills | Status: DC

## 2019-01-05 MED ORDER — METHYLPREDNISOLONE ACETATE 80 MG/ML IJ SUSP
80.0000 mg | Freq: Once | INTRAMUSCULAR | Status: AC
  Administered 2019-01-05: 80 mg via INTRAMUSCULAR
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
## 2019-01-09 ENCOUNTER — Ambulatory Visit (INDEPENDENT_AMBULATORY_CARE_PROVIDER_SITE_OTHER): Payer: BLUE CROSS/BLUE SHIELD | Admitting: Otolaryngology

## 2019-01-20 ENCOUNTER — Ambulatory Visit (HOSPITAL_COMMUNITY): Payer: BLUE CROSS/BLUE SHIELD

## 2019-01-31 ENCOUNTER — Ambulatory Visit (HOSPITAL_COMMUNITY): Payer: BLUE CROSS/BLUE SHIELD

## 2019-02-09 ENCOUNTER — Ambulatory Visit (INDEPENDENT_AMBULATORY_CARE_PROVIDER_SITE_OTHER): Payer: BLUE CROSS/BLUE SHIELD | Admitting: Otolaryngology

## 2019-02-17 ENCOUNTER — Encounter: Payer: BLUE CROSS/BLUE SHIELD | Admitting: Physician Assistant

## 2019-02-17 ENCOUNTER — Other Ambulatory Visit: Payer: Self-pay | Admitting: Physician Assistant

## 2019-02-17 ENCOUNTER — Telehealth: Payer: Self-pay | Admitting: *Deleted

## 2019-02-17 MED ORDER — DOXYCYCLINE HYCLATE 100 MG PO TABS: 100.0000 mg | ORAL_TABLET | Freq: Two times a day (BID) | ORAL | 0 refills | Status: DC

## 2019-02-17 NOTE — Telephone Encounter (Signed)
Doxycycline sent

## 2019-02-17 NOTE — Progress Notes (Signed)
Amoxicillin

## 2019-02-17 NOTE — Telephone Encounter (Signed)
Patient is complaining with a sore throat and cough. She would like an antibiotic sent into the pharmacy. Patient states zpak do not work

## 2019-02-20 NOTE — Telephone Encounter (Signed)
Patient aware, per voice mail left on voice mail,  script is ready.

## 2019-03-06 ENCOUNTER — Other Ambulatory Visit: Payer: Self-pay

## 2019-03-06 ENCOUNTER — Ambulatory Visit (INDEPENDENT_AMBULATORY_CARE_PROVIDER_SITE_OTHER): Payer: BLUE CROSS/BLUE SHIELD | Admitting: Physician Assistant

## 2019-03-06 ENCOUNTER — Encounter: Payer: Self-pay | Admitting: Physician Assistant

## 2019-03-06 DIAGNOSIS — K219 Gastro-esophageal reflux disease without esophagitis: Secondary | ICD-10-CM | POA: Diagnosis not present

## 2019-03-06 DIAGNOSIS — R531 Weakness: Secondary | ICD-10-CM | POA: Diagnosis not present

## 2019-03-06 DIAGNOSIS — R110 Nausea: Secondary | ICD-10-CM | POA: Diagnosis not present

## 2019-03-06 DIAGNOSIS — R197 Diarrhea, unspecified: Secondary | ICD-10-CM | POA: Diagnosis not present

## 2019-03-06 MED ORDER — OMEPRAZOLE 20 MG PO CPDR: DELAYED_RELEASE_CAPSULE | ORAL | 3 refills | Status: DC

## 2019-03-06 MED ORDER — ONDANSETRON 8 MG PO TBDP: 8.0000 mg | ORAL_TABLET | Freq: Three times a day (TID) | ORAL | 0 refills | Status: DC | PRN
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
## 2019-03-13 ENCOUNTER — Telehealth: Payer: Self-pay | Admitting: Physician Assistant

## 2019-03-13 NOTE — Telephone Encounter (Signed)
Patient takes oxycodone.  Do a phone visit or must be seen?

## 2019-03-13 NOTE — Telephone Encounter (Signed)
visit

## 2019-03-13 NOTE — Telephone Encounter (Signed)
Aware NTBS 

## 2019-03-14 NOTE — Progress Notes (Signed)
BP 139/82 (BP Location: Left Arm)   Pulse 68   Temp (!) 97.1 F (36.2 C) (Oral)   Ht 5\' 5"  (1.651 m)   Wt 135 lb (61.2 kg)   BMI 22.47 kg/m    Subjective:    Patient ID: Ashley MikesSandra E Vallez, female    DOB: [birth year], 58 y.o.   MRN: [id number]  HPI: Ashley Marquez is a 58 y.o. 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 We are working on lowering each month.  The first month we will start with 1 every 4 hours, then 1 every 6 hours, then 1 every 8 hours. Will be starting gabapentin and titrating up over the next few months Adding meloxicam 15 mg 1 daily Medication side effects-none Any concerns-no  Pain on scale of 1-10-7-8 Frequency-Daily What increases pain-standing, lifting What makes pain Better-rest Effects on ADL -mild Any change in general medical condition-no  Effectiveness of current meds-good Adverse reactions form pain meds-no PMP AWARE website reviewed: Yes Any suspicious activity on PMP Aware: No MME daily dose: 90, titrating down to 45 MME in 3 months  Contract on file Last UDS 10 unit next visit, she had already voided before she left work  History of overdose or risk of abuse no                [facility]  Past Medical History:  Diagnosis Date  . Anxiety   . Heart murmur    Relevant past medical, surgical, family and social history reviewed and updated as indicated. Interim medical history since our last visit reviewed. Allergies and medications reviewed and updated. DATA REVIEWED: CHART IN EPIC   Family History reviewed for pertinent findings.  Review of Systems  Constitutional: Negative.  Negative for activity change, fatigue and fever.  HENT: Negative.   Eyes: Negative.   Respiratory: Negative.  Negative for cough.   Cardiovascular: Negative.  Negative for chest pain.  Gastrointestinal: Negative.  Negative for abdominal pain.  Endocrine: Negative.   Genitourinary: Negative.  Negative for dysuria.  Musculoskeletal: Positive for arthralgias, back pain, joint swelling and myalgias.  Skin: Negative.   Neurological: Negative.     Allergies as of 03/15/2019      Reactions   Penicillins Nausea And Vomiting, Other (See Comments)   Patient slept for 2 days. Has patient had a PCN reaction causing immediate rash, facial/tongue/throat swelling, SOB or lightheadedness with hypotension: No Has patient had a PCN reaction causing severe rash involving mucus membranes or skin necrosis: No Has patient had a PCN reaction that required hospitalization No Has patient had a PCN reaction occurring within the last 10 years: No If all of the above answers are "NO", then may proceed with Cephalosporin use.      Medication List       Accurate as of March 15, 2019 11:59 PM. Always use your most recent med list.        fluticasone 50 MCG/ACT nasal spray Commonly known as:  FLONASE Place 2 sprays into both nostrils daily.   gabapentin 100 MG capsule Commonly  known as:  NEURONTIN Take 3-6 capsules (300-600 mg total) by mouth at bedtime.   lisinopril 20 MG tablet Commonly known as:  ZESTRIL Take 0.5 tablets (10 mg total) by mouth daily.   loratadine 10 MG tablet Commonly known as:  CLARITIN Take 1 tablet (10 mg total) by mouth daily.   meloxicam 15 MG tablet Commonly known as:  MOBIC Take 1 tablet (15 mg total) by mouth daily.   omeprazole 20 MG capsule Commonly known as:  PRILOSEC TAKE ONE (1) CAPSULE EACH DAY   ondansetron 8 MG disintegrating tablet Commonly known as:  Zofran  ODT Take 1 tablet (8 mg total) by mouth every 8 (eight) hours as needed for nausea or vomiting.   oxyCODONE-acetaminophen 10-325 MG tablet Commonly known as:  PERCOCET Take 1 tablet by mouth every 6 (six) hours as needed for pain.   oxyCODONE-acetaminophen 10-325 MG tablet Commonly known as:  PERCOCET Take 1 tablet by mouth every 8 (eight) hours as needed for pain.   oxyCODONE-acetaminophen 10-325 MG tablet Commonly known as:  PERCOCET Take 1 tablet by mouth every 4 (four) hours as needed for pain.          Objective:    BP 139/82 (BP Location: Left Arm)   Pulse 68   Temp (!) 97.1 F (36.2 C) (Oral)   Ht  (1.651 m)   Wt 135 lb (61.2 kg)   BMI 22.47 kg/m   Allergies  Allergen Reactions  . Penicillins Nausea And Vomiting and Other (See Comments)    Patient slept for 2 days.  Has patient had a PCN reaction causing immediate rash, facial/tongue/throat swelling, SOB or lightheadedness with hypotension: No Has patient had a PCN reaction causing severe rash involving mucus membranes or skin necrosis: No Has patient had a PCN reaction that required hospitalization No Has patient had a PCN reaction occurring within the last 10 years: No If all of the above answers are "NO", then may proceed with Cephalosporin use.     Wt Readings from Last 3 Encounters:  03/15/19 135 lb (61.2 kg)  01/05/19 141 lb (64 kg)  12/21/18 139 lb 12.8 oz (63.4 kg)    Physical Exam Constitutional:      Appearance: She is well-developed.  HENT:     Head: Normocephalic and atraumatic.  Eyes:     Conjunctiva/sclera: Conjunctivae normal.     Pupils: Pupils are equal, round, and reactive to light.  Cardiovascular:     Rate and Rhythm: Normal rate and regular rhythm.     Heart sounds: Normal heart sounds.  Pulmonary:     Effort: Pulmonary effort is normal.     Breath sounds: Normal breath sounds.  Abdominal:     General: Bowel sounds are normal.     Palpations: Abdomen is soft.   Musculoskeletal:     Lumbar back: She exhibits decreased range of motion, tenderness, pain and spasm.  Skin:    General: Skin is warm and dry.     Findings: No rash.  Neurological:     Mental Status: She is alert and oriented to person, place, and time.     Deep Tendon Reflexes: Reflexes are normal and symmetric.  Psychiatric:        Behavior: Behavior normal.        Thought Content: Thought content normal.        Judgment: Judgment normal.         Assessment & Plan:   1. Chronic right-sided low back pain  without sciatica - oxyCODONE-acetaminophen (PERCOCET) 10-325 MG tablet; Take 1 tablet by mouth every 6 (six) hours as needed for pain.  Dispense: 120 tablet; Refill: 0 - oxyCODONE-acetaminophen (PERCOCET) 10-325 MG tablet; Take 1 tablet by mouth every 8 (eight) hours as needed for pain.  Dispense: 90 tablet; Refill: 0 - oxyCODONE-acetaminophen (PERCOCET) 10-325 MG tablet; Take 1 tablet by mouth every 4 (four) hours as needed for pain.  Dispense: 150 tablet; Refill: 0 - gabapentin (NEURONTIN) 100 MG capsule; Take 3-6 capsules (300-600 mg total) by mouth at bedtime.  Dispense: 180 capsule; Refill: 5 - meloxicam (MOBIC) 15 MG tablet; Take 1 tablet (15 mg total) by mouth daily.  Dispense: 30 tablet; Refill: 5  2. Post-traumatic osteoarthritis of left knee - oxyCODONE-acetaminophen (PERCOCET) 10-325 MG tablet; Take 1 tablet by mouth every 6 (six) hours as needed for pain.  Dispense: 120 tablet; Refill: 0 - oxyCODONE-acetaminophen (PERCOCET) 10-325 MG tablet; Take 1 tablet by mouth every 8 (eight) hours as needed for pain.  Dispense: 90 tablet; Refill: 0 - oxyCODONE-acetaminophen (PERCOCET) 10-325 MG tablet; Take 1 tablet by mouth every 4 (four) hours as needed for pain.  Dispense: 150 tablet; Refill: 0 - gabapentin (NEURONTIN) 100 MG capsule; Take 3-6 capsules (300-600 mg total) by mouth at bedtime.  Dispense: 180 capsule; Refill: 5 - meloxicam (MOBIC) 15 MG tablet; Take 1 tablet (15 mg  total) by mouth daily.  Dispense: 30 tablet; Refill: 5  3. DDD (degenerative disc disease), lumbar - oxyCODONE-acetaminophen (PERCOCET) 10-325 MG tablet; Take 1 tablet by mouth every 6 (six) hours as needed for pain.  Dispense: 120 tablet; Refill: 0 - oxyCODONE-acetaminophen (PERCOCET) 10-325 MG tablet; Take 1 tablet by mouth every 8 (eight) hours as needed for pain.  Dispense: 90 tablet; Refill: 0 - oxyCODONE-acetaminophen (PERCOCET) 10-325 MG tablet; Take 1 tablet by mouth every 4 (four) hours as needed for pain.  Dispense: 150 tablet; Refill: 0 - gabapentin (NEURONTIN) 100 MG capsule; Take 3-6 capsules (300-600 mg total) by mouth at bedtime.  Dispense: 180 capsule; Refill: 5 - meloxicam (MOBIC) 15 MG tablet; Take 1 tablet (15 mg total) by mouth daily.  Dispense: 30 tablet; Refill: 5    Continue all other maintenance medications as listed above.  Follow up plan: Recheck 3 months  Educational handout given for survey  Remus Loffler PA-C [facility] [address]  [place], [place] [address] [phone]   03/20/2019, 12:20 PM

## 2019-03-15 ENCOUNTER — Other Ambulatory Visit: Payer: Self-pay

## 2019-03-15 ENCOUNTER — Ambulatory Visit: Payer: BLUE CROSS/BLUE SHIELD | Admitting: Physician Assistant

## 2019-03-15 ENCOUNTER — Encounter: Payer: Self-pay | Admitting: Physician Assistant

## 2019-03-15 VITALS — BP 139/82 | HR 68 | Temp 97.1°F | Ht 65.0 in | Wt 135.0 lb

## 2019-03-15 DIAGNOSIS — M545 Low back pain: Secondary | ICD-10-CM | POA: Diagnosis not present

## 2019-03-15 DIAGNOSIS — G8929 Other chronic pain: Secondary | ICD-10-CM

## 2019-03-15 DIAGNOSIS — M5136 Other intervertebral disc degeneration, lumbar region: Secondary | ICD-10-CM | POA: Diagnosis not present

## 2019-03-15 DIAGNOSIS — M1732 Unilateral post-traumatic osteoarthritis, left knee: Secondary | ICD-10-CM | POA: Diagnosis not present

## 2019-03-15 MED ORDER — OXYCODONE-ACETAMINOPHEN 10-325 MG PO TABS: 1.0000 | ORAL_TABLET | Freq: Four times a day (QID) | ORAL | 0 refills | Status: DC | PRN

## 2019-03-15 MED ORDER — GABAPENTIN 100 MG PO CAPS: 300.0000 mg | ORAL_CAPSULE | Freq: Every day | ORAL | 5 refills | Status: DC

## 2019-03-15 MED ORDER — MELOXICAM 15 MG PO TABS: 15.0000 mg | ORAL_TABLET | Freq: Every day | ORAL | 5 refills | Status: DC

## 2019-03-15 MED ORDER — OXYCODONE-ACETAMINOPHEN 10-325 MG PO TABS: 1.0000 | ORAL_TABLET | Freq: Three times a day (TID) | ORAL | 0 refills | Status: DC | PRN

## 2019-03-15 MED ORDER — OXYCODONE-ACETAMINOPHEN 10-325 MG PO TABS: 1.0000 | ORAL_TABLET | ORAL | 0 refills | Status: DC | PRN

## 2019-03-20 DIAGNOSIS — M5136 Other intervertebral disc degeneration, lumbar region: Secondary | ICD-10-CM | POA: Insufficient documentation

## 2019-06-13 ENCOUNTER — Other Ambulatory Visit: Payer: Self-pay

## 2019-06-14 ENCOUNTER — Ambulatory Visit: Payer: BLUE CROSS/BLUE SHIELD | Admitting: Physician Assistant

## 2019-06-14 ENCOUNTER — Encounter: Payer: Self-pay | Admitting: Physician Assistant

## 2019-06-14 DIAGNOSIS — M1732 Unilateral post-traumatic osteoarthritis, left knee: Secondary | ICD-10-CM

## 2019-06-14 DIAGNOSIS — G8929 Other chronic pain: Secondary | ICD-10-CM | POA: Diagnosis not present

## 2019-06-14 DIAGNOSIS — M5136 Other intervertebral disc degeneration, lumbar region: Secondary | ICD-10-CM

## 2019-06-14 DIAGNOSIS — M545 Low back pain: Secondary | ICD-10-CM

## 2019-06-14 MED ORDER — OXYCODONE-ACETAMINOPHEN 10-325 MG PO TABS: 1.0000 | ORAL_TABLET | Freq: Three times a day (TID) | ORAL | 0 refills | Status: DC | PRN

## 2019-06-15 NOTE — Progress Notes (Signed)
BP 127/78   Pulse 70   Temp (!) 97.3 F (36.3 C) (Oral)   Ht 5\' 5"  (1.651 m)   Wt 135 lb (61.2 kg)   BMI 22.47 kg/m    Subjective:    Patient ID: Ashley Marquez Butcher, female    DOB: 08-20-1961, 58 y.o.   MRN: [id number]  HPI: Ashley Marquez Hewlett is a 58 y.o. female presenting on 06/14/2019 for Pain (3 month )  PAIN ASSESSMENT: Cause of pain- Chronic low back pain, degenerative disc disease, posttraumatic osteoarthritis of the left knee Mild to moderate multilevel lumbar spine DDD, worse at L2-L3 andL3-L4 left knee Findings consistent with mild to moderate osteoarthritis This patient returns for a 3 month recheck on narcotic use for the above named conditions  Current medications-oxycodone/acetaminophen 10/325 1-2 4 times a day. We are working on lowering each month.  The first month we will start with 1 every 4 hours, then 1 every 6 hours, then 1 every 8 hours. Will be starting gabapentin and titrating up over the next few months Adding meloxicam 15 mg 1 daily Medication side effects-none Any concerns-no  Pain on scale of 1-10-7-8 Frequency-Daily What increases pain-standing, lifting What makes pain Better-rest Effects on ADL -mild Any change in general medical condition-no  Effectiveness of current meds-good Adverse reactions form pain meds-no PMP AWARE website reviewed: Yes Any suspicious activity on PMP Aware: No MME daily dose: 90, titrating down to 45 MME in 3 months  Contract on file Last UDS 10 unit next visit, she had already voided before she left work  History of overdose or risk of abuse no       Past Medical History:  Diagnosis Date  . Anxiety   . Heart murmur    Relevant past medical, surgical, family and social history reviewed and updated as indicated. Interim medical history since our last visit reviewed. Allergies and medications reviewed and updated. DATA REVIEWED: CHART IN EPIC  Family History reviewed for pertinent findings.  Review of  Systems  Constitutional: Negative.   HENT: Negative.   Eyes: Negative.   Respiratory: Negative.   Gastrointestinal: Negative.   Genitourinary: Negative.   Musculoskeletal: Positive for arthralgias, back pain, joint swelling and myalgias.    Allergies as of 06/14/2019      Reactions   Penicillins Nausea And Vomiting, Other (See Comments)   Patient slept for 2 days. Has patient had a PCN reaction causing immediate rash, facial/tongue/throat swelling, SOB or lightheadedness with hypotension: No Has patient had a PCN reaction causing severe rash involving mucus membranes or skin necrosis: No Has patient had a PCN reaction that required hospitalization No Has patient had a PCN reaction occurring within the last 10 years: No If all of the above answers are "NO", then may proceed with Cephalosporin use.      Medication List       Accurate as of June 14, 2019 11:59 PM. If you have any questions, ask your nurse or doctor.        fluticasone 50 MCG/ACT nasal spray Commonly known as: FLONASE Place 2 sprays into both nostrils daily.   gabapentin 100 MG capsule Commonly known as: NEURONTIN Take 3-6 capsules (300-600 mg total) by mouth at bedtime.   lisinopril 20 MG tablet Commonly known as: ZESTRIL Take 0.5 tablets (10 mg total) by mouth daily.   loratadine 10 MG tablet Commonly known as: CLARITIN Take 1 tablet (10 mg total) by mouth daily.   meloxicam 15 MG tablet  Commonly known as: MOBIC Take 1 tablet (15 mg total) by mouth daily.   omeprazole 20 MG capsule Commonly known as: PRILOSEC TAKE ONE (1) CAPSULE EACH DAY   ondansetron 8 MG disintegrating tablet Commonly known as: Zofran ODT Take 1 tablet (8 mg total) by mouth every 8 (eight) hours as needed for nausea or vomiting.   oxyCODONE-acetaminophen 10-325 MG tablet Commonly known as: PERCOCET Take 1 tablet by mouth every 8 (eight) hours as needed for pain. What changed: when to take this Changed by: Remus LofflerAngel S Erica Richwine,  PA-C   oxyCODONE-acetaminophen 10-325 MG tablet Commonly known as: PERCOCET Take 1 tablet by mouth every 8 (eight) hours as needed for pain. What changed: Another medication with the same name was changed. Make sure you understand how and when to take each. Changed by: Remus LofflerAngel S Rim Thatch, PA-C   oxyCODONE-acetaminophen 10-325 MG tablet Commonly known as: PERCOCET Take 1 tablet by mouth every 8 (eight) hours as needed for pain. What changed: when to take this Changed by: Remus LofflerAngel S Damilola Flamm, PA-C          Objective:    BP 127/78   Pulse 70   Temp (!) 97.3 F (36.3 C) (Oral)   Ht 5\' 5"  (1.651 m)   Wt 135 lb (61.2 kg)   BMI 22.47 kg/m   Allergies  Allergen Reactions  . Penicillins Nausea And Vomiting and Other (See Comments)    Patient slept for 2 days.  Has patient had a PCN reaction causing immediate rash, facial/tongue/throat swelling, SOB or lightheadedness with hypotension: No Has patient had a PCN reaction causing severe rash involving mucus membranes or skin necrosis: No Has patient had a PCN reaction that required hospitalization No Has patient had a PCN reaction occurring within the last 10 years: No If all of the above answers are "NO", then may proceed with Cephalosporin use.     Wt Readings from Last 3 Encounters:  06/14/19 135 lb (61.2 kg)  03/15/19 135 lb (61.2 kg)  01/05/19 141 lb (64 kg)    Physical Exam Constitutional:      Appearance: She is well-developed.  HENT:     Head: Normocephalic and atraumatic.  Eyes:     Conjunctiva/sclera: Conjunctivae normal.     Pupils: Pupils are equal, round, and reactive to light.  Cardiovascular:     Rate and Rhythm: Normal rate and regular rhythm.     Heart sounds: Normal heart sounds.  Pulmonary:     Effort: Pulmonary effort is normal.     Breath sounds: Normal breath sounds.  Abdominal:     General: Bowel sounds are normal.     Palpations: Abdomen is soft.  Skin:    General: Skin is warm and dry.     Findings:  No rash.  Neurological:     Mental Status: She is alert and oriented to person, place, and time.     Deep Tendon Reflexes: Reflexes are normal and symmetric.  Psychiatric:        Behavior: Behavior normal.        Thought Content: Thought content normal.        Judgment: Judgment normal.         Assessment & Plan:   1. Chronic right-sided low back pain without sciatica - oxyCODONE-acetaminophen (PERCOCET) 10-325 MG tablet; Take 1 tablet by mouth every 8 (eight) hours as needed for pain.  Dispense: 90 tablet; Refill: 0 - oxyCODONE-acetaminophen (PERCOCET) 10-325 MG tablet; Take 1 tablet by mouth every 8 (  eight) hours as needed for pain.  Dispense: 90 tablet; Refill: 0 - oxyCODONE-acetaminophen (PERCOCET) 10-325 MG tablet; Take 1 tablet by mouth every 8 (eight) hours as needed for pain.  Dispense: 90 tablet; Refill: 0  2. Post-traumatic osteoarthritis of left knee - oxyCODONE-acetaminophen (PERCOCET) 10-325 MG tablet; Take 1 tablet by mouth every 8 (eight) hours as needed for pain.  Dispense: 90 tablet; Refill: 0 - oxyCODONE-acetaminophen (PERCOCET) 10-325 MG tablet; Take 1 tablet by mouth every 8 (eight) hours as needed for pain.  Dispense: 90 tablet; Refill: 0 - oxyCODONE-acetaminophen (PERCOCET) 10-325 MG tablet; Take 1 tablet by mouth every 8 (eight) hours as needed for pain.  Dispense: 90 tablet; Refill: 0  3. DDD (degenerative disc disease), lumbar - oxyCODONE-acetaminophen (PERCOCET) 10-325 MG tablet; Take 1 tablet by mouth every 8 (eight) hours as needed for pain.  Dispense: 90 tablet; Refill: 0 - oxyCODONE-acetaminophen (PERCOCET) 10-325 MG tablet; Take 1 tablet by mouth every 8 (eight) hours as needed for pain.  Dispense: 90 tablet; Refill: 0 - oxyCODONE-acetaminophen (PERCOCET) 10-325 MG tablet; Take 1 tablet by mouth every 8 (eight) hours as needed for pain.  Dispense: 90 tablet; Refill: 0   Continue all other maintenance medications as listed above.  Follow up plan: Return  in about 3 months (around 09/14/2019).  Educational handout given for [place] PA-C [place] [address]  [place], [place] [address] [phone]   06/15/2019, 9:06 PM

## 2019-09-13 ENCOUNTER — Encounter: Payer: Self-pay | Admitting: Physician Assistant

## 2019-09-13 ENCOUNTER — Ambulatory Visit (INDEPENDENT_AMBULATORY_CARE_PROVIDER_SITE_OTHER): Payer: BC Managed Care – PPO | Admitting: Physician Assistant

## 2019-09-13 DIAGNOSIS — M545 Low back pain: Secondary | ICD-10-CM | POA: Diagnosis not present

## 2019-09-13 DIAGNOSIS — K219 Gastro-esophageal reflux disease without esophagitis: Secondary | ICD-10-CM | POA: Diagnosis not present

## 2019-09-13 DIAGNOSIS — G8929 Other chronic pain: Secondary | ICD-10-CM

## 2019-09-13 DIAGNOSIS — M1732 Unilateral post-traumatic osteoarthritis, left knee: Secondary | ICD-10-CM

## 2019-09-13 DIAGNOSIS — M5136 Other intervertebral disc degeneration, lumbar region: Secondary | ICD-10-CM

## 2019-09-13 MED ORDER — OMEPRAZOLE 20 MG PO CPDR: DELAYED_RELEASE_CAPSULE | ORAL | 3 refills | Status: DC

## 2019-09-13 MED ORDER — OXYCODONE-ACETAMINOPHEN 10-325 MG PO TABS: 1.0000 | ORAL_TABLET | Freq: Three times a day (TID) | ORAL | 0 refills | Status: DC | PRN

## 2019-09-13 MED ORDER — MELOXICAM 15 MG PO TABS: 15.0000 mg | ORAL_TABLET | Freq: Every day | ORAL | 5 refills | Status: DC

## 2019-09-13 NOTE — Progress Notes (Signed)
912     Telephone visit  Subjective: TM:[id number] on chronic conditions PCP: Remus Loffler, PA-C Ashley Marquez is a 58 y.o. female calls for telephone consult today. Patient provides verbal consent for consult held via phone.  Patient is identified with 2 separate identifiers.  At this time the entire area is on COVID-19 social distancing and stay home orders are in place.  Patient is of higher risk and therefore we are performing this by a virtual method.  Location of patient: home Location of provider: HOME Others present for call: no  .  Recheck on her chronic medical conditions.  They do include GERD, posttraumatic osteoarthritis of the knee, degenerative disc disease, chronic low back pain with sciatica.  The patient is having a lot of pain in her low back.  She is having to help take care of his elderly mother and she has to carry wood into her house.  A heat by fire.  She denies any other difficulties.  She is working in a factory job.  She states that occasionally her GERD will flareup and she does need some refills on those medications also.  PAIN ASSESSMENT: Cause of pain-Chronic low back pain, degenerative disc disease, posttraumatic osteoarthritis of the left knee  Mild to moderate multilevel lumbar spine DDD, worse at L2-L3 andL3-L4 left knee Findings consistent with mild to moderate osteoarthritis   Current medications-oxycodone/acetaminophen 10/325 1-2 4 times a day. We are working on lowering each month. The first month we will start with 1 every 4 hours, then 1 every 6 hours, then 1 every 8 hours. Will be starting gabapentin and titrating up over the next few months Adding meloxicam 15 mg 1 daily Medication side effects-none Any concerns-no  Pain on scale of 1-10-7-8 Frequency-Daily What increases pain-standing, lifting What makes pain Better-rest Effects on ADL -mild Any change in general medical condition-no  Effectiveness of current meds-good  Adverse reactions form pain meds-no PMP AWARE website reviewed:Yes Any suspicious activity on PMP Aware:No MME daily dose:45  Contract on file update at next visit Last UDS next visit History of overdose or risk of abuseno   ROS: Per HPI  Allergies  Allergen Reactions  . Penicillins Nausea And Vomiting and Other (See Comments)    Patient slept for 2 days.  Has patient had a PCN reaction causing immediate rash, facial/tongue/throat swelling, SOB or lightheadedness with hypotension: No Has patient had a PCN reaction causing severe rash involving mucus membranes or skin necrosis: No Has patient had a PCN reaction that required hospitalization No Has patient had a PCN reaction occurring within the last 10 years: No If all of the above answers are "NO", then may proceed with Cephalosporin use.    Past Medical History:  Diagnosis Date  . Anxiety   . Heart murmur     Current Outpatient Medications:  .  fluticasone (FLONASE) 50 MCG/ACT nasal spray, Place 2 sprays into both nostrils daily., Disp: 16 g, Rfl: 6 .  gabapentin (NEURONTIN) 100 MG capsule, Take 3-6 capsules (300-600 mg total) by mouth at bedtime., Disp: 180 capsule, Rfl: 5 .  lisinopril (PRINIVIL,ZESTRIL) 20 MG tablet, Take 0.5 tablets (10 mg total) by mouth daily., Disp: 90 tablet, Rfl: 3 .  loratadine (CLARITIN) 10 MG tablet, Take 1 tablet (10 mg total) by mouth daily., Disp: 90 tablet, Rfl: 3 .  meloxicam (MOBIC) 15 MG tablet, Take 1 tablet (15 mg total) by mouth daily., Disp: 30 tablet, Rfl: 5 .  omeprazole (PRILOSEC) 20 MG  capsule, TAKE ONE (1) CAPSULE EACH DAY, Disp: 90 capsule, Rfl: 3 .  ondansetron (ZOFRAN ODT) 8 MG disintegrating tablet, Take 1 tablet (8 mg total) by mouth every 8 (eight) hours as needed for nausea or vomiting., Disp: 30 tablet, Rfl: 0 .  oxyCODONE-acetaminophen (PERCOCET) 10-325 MG tablet, Take 1 tablet by mouth every 8 (eight) hours as needed for pain., Disp: 90 tablet, Rfl: 0 .   oxyCODONE-acetaminophen (PERCOCET) 10-325 MG tablet, Take 1 tablet by mouth every 8 (eight) hours as needed for pain., Disp: 90 tablet, Rfl: 0 .  oxyCODONE-acetaminophen (PERCOCET) 10-325 MG tablet, Take 1 tablet by mouth every 8 (eight) hours as needed for pain., Disp: 90 tablet, Rfl: 0  Assessment/ Plan: 58 y.o. female   1. Chronic right-sided low back pain without sciatica - oxyCODONE-acetaminophen (PERCOCET) 10-325 MG tablet; Take 1 tablet by mouth every 8 (eight) hours as needed for pain.  Dispense: 90 tablet; Refill: 0 - oxyCODONE-acetaminophen (PERCOCET) 10-325 MG tablet; Take 1 tablet by mouth every 8 (eight) hours as needed for pain.  Dispense: 90 tablet; Refill: 0 - oxyCODONE-acetaminophen (PERCOCET) 10-325 MG tablet; Take 1 tablet by mouth every 8 (eight) hours as needed for pain.  Dispense: 90 tablet; Refill: 0 - meloxicam (MOBIC) 15 MG tablet; Take 1 tablet (15 mg total) by mouth daily.  Dispense: 30 tablet; Refill: 5  2. Post-traumatic osteoarthritis of left knee - oxyCODONE-acetaminophen (PERCOCET) 10-325 MG tablet; Take 1 tablet by mouth every 8 (eight) hours as needed for pain.  Dispense: 90 tablet; Refill: 0 - oxyCODONE-acetaminophen (PERCOCET) 10-325 MG tablet; Take 1 tablet by mouth every 8 (eight) hours as needed for pain.  Dispense: 90 tablet; Refill: 0 - oxyCODONE-acetaminophen (PERCOCET) 10-325 MG tablet; Take 1 tablet by mouth every 8 (eight) hours as needed for pain.  Dispense: 90 tablet; Refill: 0 - meloxicam (MOBIC) 15 MG tablet; Take 1 tablet (15 mg total) by mouth daily.  Dispense: 30 tablet; Refill: 5  3. DDD (degenerative disc disease), lumbar - oxyCODONE-acetaminophen (PERCOCET) 10-325 MG tablet; Take 1 tablet by mouth every 8 (eight) hours as needed for pain.  Dispense: 90 tablet; Refill: 0 - oxyCODONE-acetaminophen (PERCOCET) 10-325 MG tablet; Take 1 tablet by mouth every 8 (eight) hours as needed for pain.  Dispense: 90 tablet; Refill: 0 -  oxyCODONE-acetaminophen (PERCOCET) 10-325 MG tablet; Take 1 tablet by mouth every 8 (eight) hours as needed for pain.  Dispense: 90 tablet; Refill: 0 - meloxicam (MOBIC) 15 MG tablet; Take 1 tablet (15 mg total) by mouth daily.  Dispense: 30 tablet; Refill: 5  4. Gastroesophageal reflux disease without esophagitis - omeprazole (PRILOSEC) 20 MG capsule; TAKE ONE (1) CAPSULE EACH DAY  Dispense: 90 capsule; Refill: 3   No follow-ups on file.  Continue all other maintenance medications as listed above.  Start time: 9:12 AM End time: 9:21 AM  No orders of the defined types were placed in this encounter.   Particia Nearing PA-C [facility] [phone]   19 MME

## 2019-10-31 ENCOUNTER — Encounter: Payer: Self-pay | Admitting: Family

## 2019-10-31 ENCOUNTER — Ambulatory Visit (INDEPENDENT_AMBULATORY_CARE_PROVIDER_SITE_OTHER): Payer: BC Managed Care – PPO | Admitting: Family

## 2019-10-31 DIAGNOSIS — J019 Acute sinusitis, unspecified: Secondary | ICD-10-CM

## 2019-10-31 MED ORDER — DOXYCYCLINE MONOHYDRATE 100 MG PO CAPS: 100.0000 mg | ORAL_CAPSULE | Freq: Two times a day (BID) | ORAL | 0 refills | Status: DC

## 2019-10-31 NOTE — Progress Notes (Signed)
   Virtual Visit via telephone Note Due to COVID-19 pandemic this visit was conducted virtually. This visit type was conducted due to national recommendations for restrictions regarding the COVID-19 Pandemic (e.g. social distancing, sheltering in place) in an effort to limit this patient's exposure and mitigate transmission in our community. All issues noted in this document were discussed and addressed.  A physical exam was not performed with this format.  I connected with Ashley Marquez on 10/31/19 at 10:50 AM by telephone and verified that I am speaking with the correct person using two identifiers. Ashley Marquez is currently located at work and no one is currently with her during visit. The provider, Evelina Dun, FNP is located in their office at time of visit.  I discussed the limitations, risks, security and privacy concerns of performing an evaluation and management service by telephone and the availability of in person appointments. I also discussed with the patient that there may be a patient responsible charge related to this service. The patient expressed understanding and agreed to proceed.   History and Present Illness:  Sinusitis This is a new problem. The current episode started 1 to 4 weeks ago. The problem has been gradually worsening since onset. There has been no fever. Her pain is at a severity of 0/10. She is experiencing no pain. Associated symptoms include congestion, headaches, sinus pressure and sneezing. Pertinent negatives include no coughing, ear pain, hoarse voice, shortness of breath or sore throat. Past treatments include acetaminophen. The treatment provided mild relief.      Review of Systems  HENT: Positive for congestion, sinus pressure and sneezing. Negative for ear pain, hoarse voice and sore throat.   Respiratory: Negative for cough and shortness of breath.   Neurological: Positive for headaches.  All other systems reviewed and are negative.     Observations/Objective: No SOB or distress noted   Assessment and Plan: 1. Acute sinusitis, recurrence not specified, unspecified location - Take meds as prescribed - Use a cool mist humidifier  -Use saline nose sprays frequently -Force fluids -For any cough or congestion  Use plain Mucinex- regular strength or max strength is fine -For fever or aces or pains- take tylenol or ibuprofen. -Throat lozenges if help -Call office if symptoms worsen or do not improve  - doxycycline (MONODOX) 100 MG capsule; Take 1 capsule (100 mg total) by mouth 2 (two) times daily.  Dispense: 20 capsule; Refill: 0     I discussed the assessment and treatment plan with the patient. The patient was provided an opportunity to ask questions and all were answered. The patient agreed with the plan and demonstrated an understanding of the instructions.   The patient was advised to call back or seek an in-person evaluation if the symptoms worsen or if the condition fails to improve as anticipated.  The above assessment and management plan was discussed with the patient. The patient verbalized understanding of and has agreed to the management plan. Patient is aware to call the clinic if symptoms persist or worsen. Patient is aware when to return to the clinic for a follow-up visit. Patient educated on when it is appropriate to go to the emergency department.   Time call ended:  11:00 AM  I provided 10 minutes of non-face-to-face time during this encounter.    Evelina Dun, FNP

## 2019-12-05 ENCOUNTER — Telehealth: Payer: Self-pay | Admitting: Physician Assistant

## 2019-12-05 NOTE — Telephone Encounter (Signed)
Is TELE ok, Angel??

## 2019-12-06 NOTE — Telephone Encounter (Signed)
No, needs new contract and drug screen

## 2019-12-06 NOTE — Telephone Encounter (Signed)
Apt scheduled.  

## 2019-12-08 ENCOUNTER — Telehealth: Payer: Self-pay | Admitting: *Deleted

## 2019-12-08 ENCOUNTER — Other Ambulatory Visit: Payer: Self-pay

## 2019-12-08 ENCOUNTER — Encounter: Payer: Self-pay | Admitting: Physician Assistant

## 2019-12-08 ENCOUNTER — Ambulatory Visit: Payer: 59 | Admitting: Physician Assistant

## 2019-12-08 DIAGNOSIS — M1732 Unilateral post-traumatic osteoarthritis, left knee: Secondary | ICD-10-CM

## 2019-12-08 DIAGNOSIS — M5136 Other intervertebral disc degeneration, lumbar region: Secondary | ICD-10-CM

## 2019-12-08 DIAGNOSIS — G8929 Other chronic pain: Secondary | ICD-10-CM | POA: Diagnosis not present

## 2019-12-08 DIAGNOSIS — M545 Low back pain: Secondary | ICD-10-CM | POA: Diagnosis not present

## 2019-12-08 MED ORDER — OXYCODONE-ACETAMINOPHEN 10-325 MG PO TABS: 1.0000 | ORAL_TABLET | Freq: Three times a day (TID) | ORAL | 0 refills | Status: DC | PRN

## 2019-12-08 NOTE — Progress Notes (Signed)
BP 118/66   Pulse 60   Temp (!) 97.4 F (36.3 C) (Temporal)   Ht 5\' 5"  (1.651 m)   Wt 137 lb 3.2 oz (62.2 kg)   SpO2 99%   BMI 22.83 kg/m    Subjective:    Patient ID: Ashley Marquez, female    DOB: 28-Jan-1961, 59 y.o.   MRN: [id number]  HPI: SARAIYAH HEMMINGER is a 58 y.o. female presenting on 12/08/2019 for Pain  PAIN ASSESSMENT: Cause of pain-Chronic low back pain, degenerative disc disease, posttraumatic osteoarthritis of the left knee  Mild to moderate multilevel lumbar spine DDD, worse at L2-L3 andL3-L4 left knee Findings consistent with mild to moderate osteoarthritis   Current medications-oxycodone/acetaminophen 10/325 1-2 4 times a day. We are working on lowering each month. The first month we will start with 1 every 4 hours, then 1 every 6 hours, then 1 every 8 hours. Will be starting gabapentin and titrating up over the next few months Adding meloxicam 15 mg 1 daily Medication side effects-none Any concerns-no  Pain on scale of 1-10-7-8 Frequency-Daily What increases pain-standing, lifting What makes pain Better-rest Effects on ADL -mild Any change in general medical condition-no  Effectiveness of current meds-good Adverse reactions form pain meds-no PMP AWARE website reviewed:Yes Any suspicious activity on PMP Aware:No MME daily dose:45  Contract on file update at next visit Last UDS next visit History of overdose or risk of abuseno  Past Medical History:  Diagnosis Date  . Anxiety   . Heart murmur    Relevant past medical, surgical, family and social history reviewed and updated as indicated. Interim medical history since our last visit reviewed. Allergies and medications reviewed and updated. DATA REVIEWED: CHART IN EPIC  Family History reviewed for pertinent findings.  Review of Systems  Constitutional: Negative.   HENT: Negative.   Eyes: Negative.   Respiratory: Negative.   Gastrointestinal: Negative.   Genitourinary:  Negative.   Musculoskeletal: Positive for arthralgias, back pain and myalgias.    Allergies as of 12/08/2019      Reactions   Penicillins Nausea And Vomiting, Other (See Comments)   Patient slept for 2 days. Has patient had a PCN reaction causing immediate rash, facial/tongue/throat swelling, SOB or lightheadedness with hypotension: No Has patient had a PCN reaction causing severe rash involving mucus membranes or skin necrosis: No Has patient had a PCN reaction that required hospitalization No Has patient had a PCN reaction occurring within the last 10 years: No If all of the above answers are "NO", then may proceed with Cephalosporin use.      Medication List       Accurate as of December 08, 2019 12:29 PM. If you have any questions, ask your nurse or doctor.        STOP taking these medications   doxycycline 100 MG capsule Commonly known as: MONODOX Stopped by: Terald Sleeper, PA-C     TAKE these medications   fluticasone 50 MCG/ACT nasal spray Commonly known as: FLONASE Place 2 sprays into both nostrils daily.   gabapentin 100 MG capsule Commonly known as: NEURONTIN Take 3-6 capsules (300-600 mg total) by mouth at bedtime.   lisinopril 20 MG tablet Commonly known as: ZESTRIL Take 0.5 tablets (10 mg total) by mouth daily.   loratadine 10 MG tablet Commonly known as: CLARITIN Take 1 tablet (10 mg total) by mouth daily.   meloxicam 15 MG tablet Commonly known as: MOBIC Take 1 tablet (15 mg total) by  mouth daily.   omeprazole 20 MG capsule Commonly known as: PRILOSEC TAKE ONE (1) CAPSULE EACH DAY   ondansetron 8 MG disintegrating tablet Commonly known as: Zofran ODT Take 1 tablet (8 mg total) by mouth every 8 (eight) hours as needed for nausea or vomiting.   oxyCODONE-acetaminophen 10-325 MG tablet Commonly known as: PERCOCET Take 1 tablet by mouth every 8 (eight) hours as needed for pain.   oxyCODONE-acetaminophen 10-325 MG tablet Commonly known as:  PERCOCET Take 1 tablet by mouth every 8 (eight) hours as needed for pain.   oxyCODONE-acetaminophen 10-325 MG tablet Commonly known as: PERCOCET Take 1 tablet by mouth every 8 (eight) hours as needed for pain.          Objective:    BP 118/66   Pulse 60   Temp (!) 97.4 F (36.3 C) (Temporal)   Ht 5\' 5"  (1.651 m)   Wt 137 lb 3.2 oz (62.2 kg)   SpO2 99%   BMI 22.83 kg/m   Allergies  Allergen Reactions  . Penicillins Nausea And Vomiting and Other (See Comments)    Patient slept for 2 days.  Has patient had a PCN reaction causing immediate rash, facial/tongue/throat swelling, SOB or lightheadedness with hypotension: No Has patient had a PCN reaction causing severe rash involving mucus membranes or skin necrosis: No Has patient had a PCN reaction that required hospitalization No Has patient had a PCN reaction occurring within the last 10 years: No If all of the above answers are "NO", then may proceed with Cephalosporin use.     Wt Readings from Last 3 Encounters:  12/08/19 137 lb 3.2 oz (62.2 kg)  06/14/19 135 lb (61.2 kg)  03/15/19 135 lb (61.2 kg)    Physical Exam Constitutional:      General: She is not in acute distress.    Appearance: Normal appearance. She is well-developed.  HENT:     Head: Normocephalic and atraumatic.  Cardiovascular:     Rate and Rhythm: Normal rate.  Pulmonary:     Effort: Pulmonary effort is normal.  Skin:    General: Skin is warm and dry.     Findings: No rash.  Neurological:     Mental Status: She is alert and oriented to person, place, and time.     Deep Tendon Reflexes: Reflexes are normal and symmetric.         Assessment & Plan:   1. Chronic right-sided low back pain without sciatica - DRUG SCREEN-TOXASSURE - oxyCODONE-acetaminophen (PERCOCET) 10-325 MG tablet; Take 1 tablet by mouth every 8 (eight) hours as needed for pain.  Dispense: 90 tablet; Refill: 0 - oxyCODONE-acetaminophen (PERCOCET) 10-325 MG tablet; Take 1  tablet by mouth every 8 (eight) hours as needed for pain.  Dispense: 90 tablet; Refill: 0 - oxyCODONE-acetaminophen (PERCOCET) 10-325 MG tablet; Take 1 tablet by mouth every 8 (eight) hours as needed for pain.  Dispense: 90 tablet; Refill: 0  2. Post-traumatic osteoarthritis of left knee - DRUG SCREEN-TOXASSURE - oxyCODONE-acetaminophen (PERCOCET) 10-325 MG tablet; Take 1 tablet by mouth every 8 (eight) hours as needed for pain.  Dispense: 90 tablet; Refill: 0 - oxyCODONE-acetaminophen (PERCOCET) 10-325 MG tablet; Take 1 tablet by mouth every 8 (eight) hours as needed for pain.  Dispense: 90 tablet; Refill: 0 - oxyCODONE-acetaminophen (PERCOCET) 10-325 MG tablet; Take 1 tablet by mouth every 8 (eight) hours as needed for pain.  Dispense: 90 tablet; Refill: 0  3. DDD (degenerative disc disease), lumbar - DRUG SCREEN-TOXASSURE - oxyCODONE-acetaminophen (  PERCOCET) 10-325 MG tablet; Take 1 tablet by mouth every 8 (eight) hours as needed for pain.  Dispense: 90 tablet; Refill: 0 - oxyCODONE-acetaminophen (PERCOCET) 10-325 MG tablet; Take 1 tablet by mouth every 8 (eight) hours as needed for pain.  Dispense: 90 tablet; Refill: 0 - oxyCODONE-acetaminophen (PERCOCET) 10-325 MG tablet; Take 1 tablet by mouth every 8 (eight) hours as needed for pain.  Dispense: 90 tablet; Refill: 0   Continue all other maintenance medications as listed above.  Follow up plan: No follow-ups on file.  Educational handout given for survey  Remus Loffler PA-C [facility] [address]  [place], [place] [address] [phone]   12/08/2019, 12:29 PM

## 2019-12-08 NOTE — Telephone Encounter (Signed)
Prior Auth for Oxycodone-Acetaminophen 10-325mg -In Process  Key: [facility] -   PA Case ID: [id number]   [employer] has received your information, and the request will be reviewed. You may close this dialog, return to your dashboard, and perform other tasks.  You will receive an electronic determination in CoverMyMeds. You can see the latest determination by locating this request on your dashboard or by reopening this request. You will also receive a faxed copy of the determination. If you have any questions please contact [employer] at [phone].  If you need assistance, please chat with CoverMyMeds or call us at [phone].

## 2019-12-11 NOTE — Telephone Encounter (Signed)
Approved and [facility] to [facility]

## 2019-12-13 LAB — TOXASSURE SELECT 13 (MW), URINE

## 2020-02-26 ENCOUNTER — Telehealth: Payer: Self-pay | Admitting: Physician Assistant

## 2020-02-26 NOTE — Telephone Encounter (Signed)
Called and informed patient that it is not guaranteed that Oxycodone will be prescribed by one of the providers her at the office and will need to make an in office appt to discuss.  Appt made with Dr. Nadine Counts.

## 2020-03-08 ENCOUNTER — Ambulatory Visit (INDEPENDENT_AMBULATORY_CARE_PROVIDER_SITE_OTHER): Payer: 59 | Admitting: Family Medicine

## 2020-03-08 ENCOUNTER — Other Ambulatory Visit: Payer: Self-pay

## 2020-03-08 ENCOUNTER — Encounter: Payer: Self-pay | Admitting: Family Medicine

## 2020-03-08 VITALS — BP 137/86 | HR 80 | Temp 99.1°F | Resp 18 | Ht 65.0 in | Wt 137.2 lb

## 2020-03-08 DIAGNOSIS — Z79899 Other long term (current) drug therapy: Secondary | ICD-10-CM

## 2020-03-08 DIAGNOSIS — Z7689 Persons encountering health services in other specified circumstances: Secondary | ICD-10-CM

## 2020-03-08 DIAGNOSIS — M5136 Other intervertebral disc degeneration, lumbar region: Secondary | ICD-10-CM | POA: Diagnosis not present

## 2020-03-08 DIAGNOSIS — G8929 Other chronic pain: Secondary | ICD-10-CM

## 2020-03-08 DIAGNOSIS — M1732 Unilateral post-traumatic osteoarthritis, left knee: Secondary | ICD-10-CM

## 2020-03-08 DIAGNOSIS — M545 Low back pain, unspecified: Secondary | ICD-10-CM

## 2020-03-08 DIAGNOSIS — M25562 Pain in left knee: Secondary | ICD-10-CM

## 2020-03-08 DIAGNOSIS — Z5181 Encounter for therapeutic drug level monitoring: Secondary | ICD-10-CM

## 2020-03-08 LAB — CMP14+EGFR
ALT: 30 IU/L (ref 0–32)
AST: 22 IU/L (ref 0–40)
Albumin/Globulin Ratio: 1.8 (ref 1.2–2.2)
Albumin: 4.8 g/dL (ref 3.8–4.9)
Alkaline Phosphatase: 125 IU/L — ABNORMAL HIGH (ref 39–117)
BUN/Creatinine Ratio: 23 (ref 9–23)
BUN: 17 mg/dL (ref 6–24)
Bilirubin Total: 0.6 mg/dL (ref 0.0–1.2)
CO2: 20 mmol/L (ref 20–29)
Calcium: 9.5 mg/dL (ref 8.7–10.2)
Chloride: 104 mmol/L (ref 96–106)
Creatinine, Ser: 0.75 mg/dL (ref 0.57–1.00)
GFR calc Af Amer: 102 mL/min/{1.73_m2} (ref >59)
GFR calc non Af Amer: 89 mL/min/{1.73_m2} (ref >59)
Globulin, Total: 2.7 g/dL (ref 1.5–4.5)
Glucose: 88 mg/dL (ref 65–99)
Potassium: 3.6 mmol/L (ref 3.5–5.2)
Sodium: 141 mmol/L (ref 134–144)
Total Protein: 7.5 g/dL (ref 6.0–8.5)

## 2020-03-08 LAB — CBC
Hematocrit: 41.2 % (ref 34.0–46.6)
Hemoglobin: 13.8 g/dL (ref 11.1–15.9)
MCH: 31.4 pg (ref 26.6–33.0)
MCHC: 33.5 g/dL (ref 31.5–35.7)
MCV: 94 fL (ref 79–97)
Platelets: 314 10*3/uL (ref 150–450)
RBC: 4.39 x10E6/uL (ref 3.77–5.28)
RDW: 12.8 % (ref 11.7–15.4)
WBC: 6.3 10*3/uL (ref 3.4–10.8)

## 2020-03-08 MED ORDER — OXYCODONE-ACETAMINOPHEN 10-325 MG PO TABS: 1.0000 | ORAL_TABLET | Freq: Three times a day (TID) | ORAL | 0 refills | Status: DC | PRN
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
## 2020-03-08 NOTE — Patient Instructions (Addendum)
You had labs performed today.  You will be contacted with the results of the labs once they are available, usually in the next 3 business days for routine lab work.  If you have an active my chart account, they will be released to your MyChart.  If you prefer to have these labs released to you via telephone, please let us know.  You are due for pap smear and mammogram.  We talked about the risks of opioid use and the gradual need for higher doses as time goes on.  This is the maximum dose I will ever be able to prescribe you.  If higher doses are ever needed, you will have to be referred to pain management.  I highly recommend that you consider evaluation by a specialist (back doctor) to see if there are any non-opioid interventions that might help your chronic pain.    Controlled Substance Guidelines:  1. You cannot get an early refill, even it is lost.  2. You cannot get controlled medications from any other doctor, unless it is the emergency department and related to a new problem or injury.  3. You cannot use alcohol, marijuana, cocaine or any other recreational drugs while using this medication. This is very dangerous.  4. You are willing to have your urine drug tested at each visit.  5. You will not drive while using this medication, because that can put yourself and others in serious danger of an accident. 6. If any medication is stolen, then there must be a police report to verify it, or it cannot be refilled.  7. I will not prescribe these medications for longer than 3 months.  8. You must bring your pill bottle to each visit.  9. You must use the same pharmacy for all refills for the medication, unless you clear it with me beforehand.  10. You cannot share or sell this medication.

## 2020-03-11 LAB — TOXASSURE SELECT 13 (MW), URINE

## 2020-04-03 ENCOUNTER — Ambulatory Visit: Payer: BLUE CROSS/BLUE SHIELD | Admitting: Specialist

## 2020-04-03 ENCOUNTER — Telehealth: Payer: Self-pay | Admitting: Family Medicine

## 2020-04-03 NOTE — Telephone Encounter (Signed)
  Prescription Request  04/03/2020  What is the name of the medication or equipment? Oxycodine RX  Have you contacted your pharmacy to request a refill? (if applicable) Yes  Which pharmacy would you like this sent to? [facility]   Patient notified that their request is being sent to the clinical staff for review and that they should receive a response within 2 business days.   [facility]alk[facility] pt.  Pt is leaving to go out of town on Friday morning about 10:00am & she wants to get it refilled before she leaves.

## 2020-04-03 NOTE — Telephone Encounter (Signed)
LMOVM that refill is on hold at the pharmacy, refill day will depend on the day that she got it filled last month since it is a controlled substance and to contact [facility] for the refill

## 2020-04-17 ENCOUNTER — Other Ambulatory Visit: Payer: Self-pay

## 2020-04-17 ENCOUNTER — Encounter: Payer: Self-pay | Admitting: Family Medicine

## 2020-04-17 ENCOUNTER — Ambulatory Visit (INDEPENDENT_AMBULATORY_CARE_PROVIDER_SITE_OTHER): Payer: 59 | Admitting: Family Medicine

## 2020-04-17 DIAGNOSIS — J302 Other seasonal allergic rhinitis: Secondary | ICD-10-CM

## 2020-04-17 MED ORDER — LORATADINE 10 MG PO TABS: 10.0000 mg | ORAL_TABLET | Freq: Every day | ORAL | 1 refills | Status: DC

## 2020-04-17 NOTE — Progress Notes (Signed)
Virtual Visit via Telephone Note  I connected with Ashley Marquez on 04/17/20 at 5:49 PM by telephone and verified that I am speaking with the correct person using two identifiers. Ashley Marquez is currently located in her car and her mom is currently with her during this visit. The provider, Loman Brooklyn, FNP is located in their office at time of visit.  I discussed the limitations, risks, security and privacy concerns of performing an evaluation and management service by telephone and the availability of in person appointments. I also discussed with the patient that there may be a patient responsible charge related to this service. The patient expressed understanding and agreed to proceed.  Subjective: PCP: [name], DO  Chief Complaint  Patient presents with  . URI   Patient complains of sore throat from drainage, ear itching and postnasal drainage. Onset of symptoms was 1 week ago, gradually worsening since that time. She is drinking plenty of fluids. Evaluation to date: none. Treatment to date: nasal steroids and Dayquil. She does not smoke.    ROS: Per HPI  Current Outpatient Medications:  .  fluticasone (FLONASE) 50 MCG/ACT nasal spray, Place 2 sprays into both nostrils daily., Disp: 16 g, Rfl: 6 .  lisinopril (PRINIVIL,ZESTRIL) 20 MG tablet, Take 0.5 tablets (10 mg total) by mouth daily., Disp: 90 tablet, Rfl: 3 .  loratadine (CLARITIN) 10 MG tablet, Take 1 tablet (10 mg total) by mouth daily., Disp: 90 tablet, Rfl: 3 .  omeprazole (PRILOSEC) 20 MG capsule, TAKE ONE (1) CAPSULE EACH DAY, Disp: 90 capsule, Rfl: 3 .  ondansetron (ZOFRAN ODT) 8 MG disintegrating tablet, Take 1 tablet (8 mg total) by mouth every 8 (eight) hours as needed for nausea or vomiting., Disp: 30 tablet, Rfl: 0 .  [START ON 05/07/2020] oxyCODONE-acetaminophen (PERCOCET) 10-325 MG tablet, Take 1 tablet by mouth every 8 (eight) hours as needed for pain., Disp: 90 tablet, Rfl: 0 .   oxyCODONE-acetaminophen (PERCOCET) 10-325 MG tablet, Take 1 tablet by mouth every 8 (eight) hours as needed for pain., Disp: 90 tablet, Rfl: 0 .  oxyCODONE-acetaminophen (PERCOCET) 10-325 MG tablet, Take 1 tablet by mouth every 8 (eight) hours as needed for pain., Disp: 90 tablet, Rfl: 0  Allergies  Allergen Reactions  . Penicillins Nausea And Vomiting and Other (See Comments)    Patient slept for 2 days.  Has patient had a PCN reaction causing immediate rash, facial/tongue/throat swelling, SOB or lightheadedness with hypotension: No Has patient had a PCN reaction causing severe rash involving mucus membranes or skin necrosis: No Has patient had a PCN reaction that required hospitalization No Has patient had a PCN reaction occurring within the last 10 years: No If all of the above answers are "NO", then may proceed with Cephalosporin use.    Past Medical History:  Diagnosis Date  . Anxiety   . Heart murmur     Observations/Objective: A&O  No respiratory distress or wheezing audible over the phone Mood, judgement, and thought processes all WNL  Assessment and Plan: 1. Seasonal allergies - Continue Flonase daily. Refilled Claritin as she has been out of her medication.  - loratadine (CLARITIN) 10 MG tablet; Take 1 tablet (10 mg total) by mouth daily.  Dispense: 90 tablet; Refill: 1   Follow Up Instructions:  I discussed the assessment and treatment plan with the patient. The patient was provided an opportunity to ask questions and all were answered. The patient agreed with the plan and demonstrated an  understanding of the instructions.   The patient was advised to call back or seek an in-person evaluation if the symptoms worsen or if the condition fails to improve as anticipated.  The above assessment and management plan was discussed with the patient. The patient verbalized understanding of and has agreed to the management plan. Patient is aware to call the clinic if symptoms  persist or worsen. Patient is aware when to return to the clinic for a follow-up visit. Patient educated on when it is appropriate to go to the emergency department.   Time call ended: 5:55 PM  I provided 8 minutes of non-face-to-face time during this encounter.  Deliah Boston, MSN, APRN, FNP-C [facility] 04/17/20
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
## 2020-05-15 ENCOUNTER — Encounter: Payer: Self-pay | Admitting: Specialist

## 2020-05-15 ENCOUNTER — Other Ambulatory Visit: Payer: Self-pay

## 2020-05-15 ENCOUNTER — Ambulatory Visit (INDEPENDENT_AMBULATORY_CARE_PROVIDER_SITE_OTHER): Payer: 59 | Admitting: Specialist

## 2020-05-15 ENCOUNTER — Ambulatory Visit: Payer: Self-pay

## 2020-05-15 VITALS — BP 149/78 | HR 57 | Ht 65.0 in | Wt 138.0 lb

## 2020-05-15 DIAGNOSIS — M1712 Unilateral primary osteoarthritis, left knee: Secondary | ICD-10-CM | POA: Diagnosis not present

## 2020-05-15 DIAGNOSIS — M4726 Other spondylosis with radiculopathy, lumbar region: Secondary | ICD-10-CM | POA: Diagnosis not present

## 2020-05-15 DIAGNOSIS — M5136 Other intervertebral disc degeneration, lumbar region: Secondary | ICD-10-CM

## 2020-05-15 DIAGNOSIS — M25562 Pain in left knee: Secondary | ICD-10-CM

## 2020-05-15 DIAGNOSIS — G8929 Other chronic pain: Secondary | ICD-10-CM

## 2020-05-15 MED ORDER — IBUPROFEN-FAMOTIDINE 800-26.6 MG PO TABS: 1.0000 | ORAL_TABLET | Freq: Three times a day (TID) | ORAL | 3 refills | Status: DC

## 2020-05-15 MED ORDER — DICLOFENAC SODIUM 1 % EX GEL: 4.0000 g | Freq: Four times a day (QID) | CUTANEOUS | 3 refills | Status: DC
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
## 2020-05-15 NOTE — Patient Instructions (Signed)
Plan: Knee is suffering from osteoarthritis, only real proven treatments are Weight loss, NSIADs like diclofenac and exercise. Well padded shoes help. Ice the knee that is suffering from osteoarthritis, only real proven treatments are Weight loss, NSIADs like deuxis,  diclofenac gel and exercise. Well padded shoes help. Ice the knee 2-3 times a day 15-20 mins at a time.-3 times a day 15-20 mins at a time. Hot showers in the AM.  Injection with steroid may be of benefit. Hemp CBD capsules, amazon.com 5,000-7,000 mg per bottle, 60 capsules per bottle, take one capsule twice a day. Cane in the left hand to use with left leg weight bearing. Avoid frequent bending and stooping  No lifting greater than 10 lbs. May use ice or moist heat for pain. Weight loss is of benefit. Best medication for lumbar disc disease (DDD)is arthritis medications like deuxis a combination medication to help with GERD Symptoms and allow use of an NSAID for DDD and arthrosis of the lumbar spine and left knee arthrosis. Exercise is important to improve your indurance and does allow people to function better inspite of back pain. Do not use oxycodone for the treatment of your back or knee condition, it is addictive and does nothing to improve the conditon.

## 2020-06-07 ENCOUNTER — Ambulatory Visit: Payer: 59 | Admitting: Family Medicine

## 2020-06-07 ENCOUNTER — Encounter: Payer: Self-pay | Admitting: Family Medicine

## 2020-06-07 ENCOUNTER — Other Ambulatory Visit: Payer: Self-pay

## 2020-06-07 VITALS — BP 133/74 | HR 64 | Temp 97.6°F | Ht 65.0 in | Wt 140.4 lb

## 2020-06-07 DIAGNOSIS — F1190 Opioid use, unspecified, uncomplicated: Secondary | ICD-10-CM | POA: Diagnosis not present

## 2020-06-07 DIAGNOSIS — M545 Low back pain: Secondary | ICD-10-CM | POA: Diagnosis not present

## 2020-06-07 DIAGNOSIS — K219 Gastro-esophageal reflux disease without esophagitis: Secondary | ICD-10-CM

## 2020-06-07 DIAGNOSIS — M1732 Unilateral post-traumatic osteoarthritis, left knee: Secondary | ICD-10-CM | POA: Diagnosis not present

## 2020-06-07 DIAGNOSIS — M5136 Other intervertebral disc degeneration, lumbar region: Secondary | ICD-10-CM | POA: Diagnosis not present

## 2020-06-07 DIAGNOSIS — G8929 Other chronic pain: Secondary | ICD-10-CM

## 2020-06-07 MED ORDER — OXYCODONE-ACETAMINOPHEN 10-325 MG PO TABS: 0.5000 | ORAL_TABLET | Freq: Three times a day (TID) | ORAL | 0 refills | Status: DC | PRN

## 2020-06-07 MED ORDER — OXYCODONE-ACETAMINOPHEN 10-325 MG PO TABS: ORAL_TABLET | ORAL | 0 refills | Status: DC

## 2020-06-07 MED ORDER — OMEPRAZOLE 20 MG PO CPDR: DELAYED_RELEASE_CAPSULE | ORAL | 3 refills | Status: DC

## 2020-06-07 MED ORDER — MELOXICAM 7.5 MG PO TABS: 7.5000 mg | ORAL_TABLET | Freq: Every day | ORAL | 3 refills | Status: DC | PRN

## 2020-06-07 NOTE — Patient Instructions (Signed)
Reduce to 1/2 tablet as we discussed.  See me in 3 m
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
## 2020-06-25 ENCOUNTER — Ambulatory Visit: Payer: 59 | Admitting: Family Medicine

## 2020-07-08 ENCOUNTER — Ambulatory Visit: Payer: 59 | Admitting: Specialist

## 2020-09-16 ENCOUNTER — Encounter: Payer: Self-pay | Admitting: Family Medicine

## 2020-09-16 ENCOUNTER — Other Ambulatory Visit: Payer: Self-pay

## 2020-09-16 ENCOUNTER — Ambulatory Visit: Payer: 59 | Admitting: Family Medicine

## 2020-09-16 VITALS — BP 131/82 | HR 76 | Temp 97.3°F | Ht 65.0 in | Wt 137.0 lb

## 2020-09-16 DIAGNOSIS — M5136 Other intervertebral disc degeneration, lumbar region: Secondary | ICD-10-CM

## 2020-09-16 DIAGNOSIS — M545 Low back pain, unspecified: Secondary | ICD-10-CM

## 2020-09-16 DIAGNOSIS — I10 Essential (primary) hypertension: Secondary | ICD-10-CM | POA: Diagnosis not present

## 2020-09-16 DIAGNOSIS — M1732 Unilateral post-traumatic osteoarthritis, left knee: Secondary | ICD-10-CM

## 2020-09-16 DIAGNOSIS — L309 Dermatitis, unspecified: Secondary | ICD-10-CM

## 2020-09-16 DIAGNOSIS — G8929 Other chronic pain: Secondary | ICD-10-CM

## 2020-09-16 MED ORDER — OXYCODONE-ACETAMINOPHEN 10-325 MG PO TABS: 0.5000 | ORAL_TABLET | Freq: Three times a day (TID) | ORAL | 0 refills | Status: DC | PRN

## 2020-09-16 MED ORDER — LISINOPRIL 20 MG PO TABS: 10.0000 mg | ORAL_TABLET | Freq: Every day | ORAL | 3 refills | Status: DC
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
## 2020-12-24 ENCOUNTER — Other Ambulatory Visit: Payer: Self-pay

## 2020-12-24 ENCOUNTER — Encounter: Payer: Self-pay | Admitting: Family Medicine

## 2020-12-24 ENCOUNTER — Ambulatory Visit (INDEPENDENT_AMBULATORY_CARE_PROVIDER_SITE_OTHER): Payer: 59 | Admitting: Family Medicine

## 2020-12-24 VITALS — BP 136/78 | HR 72 | Temp 96.8°F | Resp 20 | Ht 65.0 in | Wt 141.0 lb

## 2020-12-24 DIAGNOSIS — M545 Low back pain, unspecified: Secondary | ICD-10-CM

## 2020-12-24 DIAGNOSIS — G8929 Other chronic pain: Secondary | ICD-10-CM

## 2020-12-24 DIAGNOSIS — M1732 Unilateral post-traumatic osteoarthritis, left knee: Secondary | ICD-10-CM | POA: Diagnosis not present

## 2020-12-24 DIAGNOSIS — M5136 Other intervertebral disc degeneration, lumbar region: Secondary | ICD-10-CM

## 2020-12-24 DIAGNOSIS — F1190 Opioid use, unspecified, uncomplicated: Secondary | ICD-10-CM | POA: Diagnosis not present

## 2020-12-24 DIAGNOSIS — J302 Other seasonal allergic rhinitis: Secondary | ICD-10-CM

## 2020-12-24 MED ORDER — OXYCODONE-ACETAMINOPHEN 10-325 MG PO TABS: 0.5000 | ORAL_TABLET | Freq: Three times a day (TID) | ORAL | 0 refills | Status: DC | PRN

## 2020-12-24 MED ORDER — LORATADINE 10 MG PO TABS: 10.0000 mg | ORAL_TABLET | Freq: Every day | ORAL | 3 refills | Status: DC

## 2020-12-24 NOTE — Progress Notes (Signed)
Subjective: CC: Pain PCP: Raliegh Ip, DO WFU:XNATFT Ashley Marquez is a 60 y.o. female presenting to clinic today for:  1. Pain management Patient reports the pain has been in flare over the last several months due to the winter changes. She had been getting some half tablet in the morning with half tablet in the evening and 1 tablet midday in and this was working okay for a bit but lately she has been doing 1 tablet twice daily with 1/2 tablet at bedtime fairly regularly. Denies any falls, confusion, constipation, respiratory depression. No alcohol or drug use. Last dose was about 1 week ago.   ROS: Per HPI  Allergies  Allergen Reactions  . Gabapentin     hallucination  . Penicillins Nausea And Vomiting and Other (See Comments)    Patient slept for 2 days.  Has patient had a PCN reaction causing immediate rash, facial/tongue/throat swelling, SOB or lightheadedness with hypotension: No Has patient had a PCN reaction causing severe rash involving mucus membranes or skin necrosis: No Has patient had a PCN reaction that required hospitalization No Has patient had a PCN reaction occurring within the last 10 years: No If all of the above answers are "NO", then may proceed with Cephalosporin use.    Past Medical History:  Diagnosis Date  . Anxiety   . Heart murmur     Current Outpatient Medications:  .  fluticasone (FLONASE) 50 MCG/ACT nasal spray, Place 2 sprays into both nostrils daily., Disp: 16 g, Rfl: 6 .  lisinopril (ZESTRIL) 20 MG tablet, Take 0.5 tablets (10 mg total) by mouth daily., Disp: 90 tablet, Rfl: 3 .  loratadine (CLARITIN) 10 MG tablet, Take 1 tablet (10 mg total) by mouth daily., Disp: 90 tablet, Rfl: 1 .  omeprazole (PRILOSEC) 20 MG capsule, TAKE ONE (1) CAPSULE EACH DAY, Disp: 90 capsule, Rfl: 3 .  oxyCODONE-acetaminophen (PERCOCET) 10-325 MG tablet, Take 0.5-1 tablets by mouth every 8 (eight) hours as needed for pain., Disp: 75 tablet, Rfl: 0 .   oxyCODONE-acetaminophen (PERCOCET) 10-325 MG tablet, Take 0.5-1 tablets by mouth every 8 (eight) hours as needed for pain. (Patient not taking: Reported on 12/24/2020), Disp: 75 tablet, Rfl: 0 .  oxyCODONE-acetaminophen (PERCOCET) 10-325 MG tablet, Take 0.5-1 tablets by mouth every 8 (eight) hours as needed for pain. (Patient not taking: Reported on 12/24/2020), Disp: 75 tablet, Rfl: 0 Social History   Socioeconomic History  . Marital status: Divorced    Spouse name: Not on file  . Number of children: 2  . Years of education: Not on file  . Highest education level: Not on file  Occupational History    Comment: [employer]  Tobacco Use  . Smoking status: Never Smoker  . Smokeless tobacco: Never Used  Vaping Use  . Vaping Use: Never used  Substance and Sexual Activity  . Alcohol use: No  . Drug use: No  . Sexual activity: Not on file  Other Topics Concern  . Not on file  Social History Narrative  . Not on file   Social Determinants of Health   Financial Resource Strain: Not on file  Food Insecurity: Not on file  Transportation Needs: Not on file  Physical Activity: Not on file  Stress: Not on file  Social Connections: Not on file  Intimate Partner Violence: Not on file   Family History  Problem Relation Age of Onset  . Emphysema Father 25    Objective: Office vital signs reviewed. BP 136/78  Pulse 72   Temp (!) 96.8 F (36 C)   Resp 20   Ht 5\' 5"  (1.651 m)   Wt 141 lb (64 kg)   SpO2 100%   BMI 23.46 kg/m   Physical Examination:  General: Awake, alert, well nourished, No acute distress HEENT: Normal; sclera [place] Cardio: regular rate and rhythm, S1S2 heard, no murmurs appreciated Pulm: clear to auscultation bilaterally, no wheezes, rhonchi or rales; normal work of breathing on room air Extremities: warm, well perfused, No edema, cyanosis or clubbing; +2 pulses bilaterally MSK: Ambulating independently  Assessment/ Plan: 60 y.o. female   Chronic right-sided  low back pain without sciatica - Plan: ToxASSURE Select 13 (MW), Urine, oxyCODONE-acetaminophen (PERCOCET) 10-325 MG tablet, oxyCODONE-acetaminophen (PERCOCET) 10-325 MG tablet, oxyCODONE-acetaminophen (PERCOCET) 10-325 MG tablet  DDD (degenerative disc disease), lumbar - Plan: ToxASSURE Select 13 (MW), Urine, oxyCODONE-acetaminophen (PERCOCET) 10-325 MG tablet, oxyCODONE-acetaminophen (PERCOCET) 10-325 MG tablet, oxyCODONE-acetaminophen (PERCOCET) 10-325 MG tablet  Post-traumatic osteoarthritis of left knee - Plan: ToxASSURE Select 13 (MW), Urine, oxyCODONE-acetaminophen (PERCOCET) 10-325 MG tablet, oxyCODONE-acetaminophen (PERCOCET) 10-325 MG tablet, oxyCODONE-acetaminophen (PERCOCET) 10-325 MG tablet  Chronic, continuous use of opioids - Plan: ToxASSURE Select 13 (MW), Urine  Seasonal allergies - Plan: loratadine (CLARITIN) 10 MG tablet  Symptoms have been somewhat exacerbated with the change in weather. She was unable to maintain half tablet twice daily with 1 tablet daily. Okay to continue 1 tablet twice daily with 1/2 tablet at bedtime for these winter months and will try and push 1/2 tablet twice daily and 1 tablet daily next visit. UDS was obtained. Plan for CSC at next visit. Last dose of opioid was over 1 week ago due to scheduling conflicts and missed refill appointment   The Narcotic Database has been reviewed.  There were no red flags.    Orders Placed This Encounter  Procedures  . ToxASSURE Select 13 (MW), Urine   No orders of the defined types were placed in this encounter.    02-15-1985, DO [facility] [phone]

## 2020-12-31 LAB — TOXASSURE SELECT 13 (MW), URINE

## 2021-01-14 ENCOUNTER — Encounter: Payer: Self-pay | Admitting: *Deleted

## 2021-03-24 ENCOUNTER — Encounter: Payer: 59 | Admitting: Family Medicine

## 2021-03-25 ENCOUNTER — Encounter: Payer: Self-pay | Admitting: Family Medicine

## 2021-03-25 ENCOUNTER — Other Ambulatory Visit: Payer: Self-pay

## 2021-03-25 ENCOUNTER — Ambulatory Visit: Payer: 59 | Admitting: Family Medicine

## 2021-03-25 VITALS — BP 144/81 | HR 76 | Temp 97.1°F | Ht 65.0 in | Wt 142.6 lb

## 2021-03-25 DIAGNOSIS — G8929 Other chronic pain: Secondary | ICD-10-CM

## 2021-03-25 DIAGNOSIS — Z9889 Other specified postprocedural states: Secondary | ICD-10-CM

## 2021-03-25 DIAGNOSIS — M545 Low back pain, unspecified: Secondary | ICD-10-CM

## 2021-03-25 DIAGNOSIS — M1732 Unilateral post-traumatic osteoarthritis, left knee: Secondary | ICD-10-CM

## 2021-03-25 DIAGNOSIS — I10 Essential (primary) hypertension: Secondary | ICD-10-CM

## 2021-03-25 DIAGNOSIS — M5136 Other intervertebral disc degeneration, lumbar region: Secondary | ICD-10-CM

## 2021-03-25 DIAGNOSIS — M25562 Pain in left knee: Secondary | ICD-10-CM | POA: Diagnosis not present

## 2021-03-25 MED ORDER — LISINOPRIL 10 MG PO TABS: 10.0000 mg | ORAL_TABLET | Freq: Every day | ORAL | 3 refills | Status: DC

## 2021-03-25 MED ORDER — OXYCODONE-ACETAMINOPHEN 10-325 MG PO TABS: 0.5000 | ORAL_TABLET | Freq: Three times a day (TID) | ORAL | 0 refills | Status: DC | PRN

## 2021-03-25 MED ORDER — DICLOFENAC SODIUM 75 MG PO TBEC: 75.0000 mg | DELAYED_RELEASE_TABLET | Freq: Two times a day (BID) | ORAL | 1 refills | Status: DC | PRN
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
## 2021-03-25 NOTE — Patient Instructions (Signed)
You have prescribed a nonsteroidal anti-inflammatory drug (NSAID) today. This will help with your pain and inflammation. Please do not take any other NSAIDs (ibuprofen/Motrin/Advil, naproxen/Aleve, meloxicam/Mobic, Voltaren/diclofenac). Please make sure to eat a meal when taking this medication.   Caution:  If you have a history of acid reflux/indigestion, I recommend that you take an antacid (such as Prilosec, Prevacid) daily while on the NSAID.  If you have a history of bleeding disorder, gastric ulcer, are on a blood thinner (like warfarin/Coumadin, Xarelto, Eliquis, etc) please do not take NSAID.  If you have ever had a heart attack, you should not take NSAIDs.  

## 2021-06-13 ENCOUNTER — Ambulatory Visit (INDEPENDENT_AMBULATORY_CARE_PROVIDER_SITE_OTHER): Payer: Self-pay | Admitting: Family Medicine

## 2021-06-13 ENCOUNTER — Encounter: Payer: Self-pay | Admitting: Family Medicine

## 2021-06-13 DIAGNOSIS — J302 Other seasonal allergic rhinitis: Secondary | ICD-10-CM

## 2021-06-13 MED ORDER — IPRATROPIUM BROMIDE 0.03 % NA SOLN: 2.0000 | Freq: Two times a day (BID) | NASAL | 0 refills | Status: DC

## 2021-06-13 NOTE — Progress Notes (Signed)
Virtual Visit via Telephone Note  I connected with Ashley Marquez on 06/13/21 at 11:13 AM by telephone and verified that I am speaking with the correct person using two identifiers. Ashley Marquez is currently located at work and nobody is currently with her during this visit. The provider, Gwenlyn Fudge, FNP is located in their office at time of visit.  I discussed the limitations, risks, security and privacy concerns of performing an evaluation and management service by telephone and the availability of in person appointments. I also discussed with the patient that there may be a patient responsible charge related to this service. The patient expressed understanding and agreed to proceed.  Subjective: PCP: Raliegh Ip, DO  Chief Complaint  Patient presents with   URI   Patient complains of head congestion, runny nose, sneezing, and postnasal drainage. Onset of symptoms was 1 day ago, unchanged since that time. She is drinking plenty of fluids. Evaluation to date: at home COVID test was negative. Treatment to date: antihistamines, nasal steroids, and Tylenol . She does not smoke.    ROS: Per HPI  Current Outpatient Medications:    diclofenac (VOLTAREN) 75 MG EC tablet, Take 1 tablet (75 mg total) by mouth 2 (two) times daily as needed for moderate pain., Disp: 60 tablet, Rfl: 1   fluticasone (FLONASE) 50 MCG/ACT nasal spray, Place 2 sprays into both nostrils daily., Disp: 16 g, Rfl: 6   lisinopril (ZESTRIL) 10 MG tablet, Take 1 tablet (10 mg total) by mouth daily. **Note change in strength, Disp: 90 tablet, Rfl: 3   loratadine (CLARITIN) 10 MG tablet, Take 1 tablet (10 mg total) by mouth daily., Disp: 90 tablet, Rfl: 3   omeprazole (PRILOSEC) 20 MG capsule, TAKE ONE (1) CAPSULE EACH DAY, Disp: 90 capsule, Rfl: 3   oxyCODONE-acetaminophen (PERCOCET) 10-325 MG tablet, Take 0.5-1 tablets by mouth every 8 (eight) hours as needed for pain., Disp: 75 tablet, Rfl: 0    oxyCODONE-acetaminophen (PERCOCET) 10-325 MG tablet, Take 0.5-1 tablets by mouth every 8 (eight) hours as needed for pain., Disp: 75 tablet, Rfl: 0   oxyCODONE-acetaminophen (PERCOCET) 10-325 MG tablet, Take 0.5-1 tablets by mouth every 8 (eight) hours as needed for pain., Disp: 75 tablet, Rfl: 0  Allergies  Allergen Reactions   Gabapentin     hallucination   Penicillins Nausea And Vomiting and Other (See Comments)    Patient slept for 2 days.  Has patient had a PCN reaction causing immediate rash, facial/tongue/throat swelling, SOB or lightheadedness with hypotension: No Has patient had a PCN reaction causing severe rash involving mucus membranes or skin necrosis: No Has patient had a PCN reaction that required hospitalization No Has patient had a PCN reaction occurring within the last 10 years: No If all of the above answers are "NO", then may proceed with Cephalosporin use.    Past Medical History:  Diagnosis Date   Anxiety    Heart murmur     Observations/Objective: A&O  No respiratory distress or wheezing audible over the phone Mood, judgement, and thought processes all WNL  Assessment and Plan: 1. Seasonal allergies Uncontrolled after mowing all day. Continue Claritin daily. Increase Flonase to twice daily while flared up, then go back to once daily. Atrovent x3 days to help dry things up. - ipratropium (ATROVENT) 0.03 % nasal spray; Place 2 sprays into both nostrils every 12 (twelve) hours for 3 days.  Dispense: 30 mL; Refill: 0   Follow Up Instructions:  I discussed  the assessment and treatment plan with the patient. The patient was provided an opportunity to ask questions and all were answered. The patient agreed with the plan and demonstrated an understanding of the instructions.   The patient was advised to call back or seek an in-person evaluation if the symptoms worsen or if the condition fails to improve as anticipated.  The above assessment and management plan  was discussed with the patient. The patient verbalized understanding of and has agreed to the management plan. Patient is aware to call the clinic if symptoms persist or worsen. Patient is aware when to return to the clinic for a follow-up visit. Patient educated on when it is appropriate to go to the emergency department.   Time call ended: 11:24 AM  I provided 11 minutes of non-face-to-face time during this encounter.  Deliah Boston, MSN, APRN, FNP-C [facility] 06/13/21

## 2021-06-24 ENCOUNTER — Encounter: Payer: Self-pay | Admitting: Family Medicine

## 2021-06-24 ENCOUNTER — Other Ambulatory Visit (HOSPITAL_COMMUNITY)
Admission: RE | Admit: 2021-06-24 | Discharge: 2021-06-24 | Disposition: A | Discharge status: Home / Self Care | Payer: 59 | Source: Ambulatory Visit | Attending: Family Medicine | Admitting: Family Medicine

## 2021-06-24 ENCOUNTER — Ambulatory Visit (INDEPENDENT_AMBULATORY_CARE_PROVIDER_SITE_OTHER): Payer: 59 | Admitting: Family Medicine

## 2021-06-24 ENCOUNTER — Other Ambulatory Visit: Payer: Self-pay

## 2021-06-24 VITALS — BP 135/79 | HR 93 | Temp 97.9°F | Ht 65.0 in | Wt 138.0 lb

## 2021-06-24 DIAGNOSIS — J302 Other seasonal allergic rhinitis: Secondary | ICD-10-CM

## 2021-06-24 DIAGNOSIS — M545 Low back pain, unspecified: Secondary | ICD-10-CM

## 2021-06-24 DIAGNOSIS — M5136 Other intervertebral disc degeneration, lumbar region: Secondary | ICD-10-CM

## 2021-06-24 DIAGNOSIS — Z124 Encounter for screening for malignant neoplasm of cervix: Secondary | ICD-10-CM | POA: Insufficient documentation

## 2021-06-24 DIAGNOSIS — M1732 Unilateral post-traumatic osteoarthritis, left knee: Secondary | ICD-10-CM

## 2021-06-24 DIAGNOSIS — I10 Essential (primary) hypertension: Secondary | ICD-10-CM

## 2021-06-24 DIAGNOSIS — Z0000 Encounter for general adult medical examination without abnormal findings: Secondary | ICD-10-CM

## 2021-06-24 DIAGNOSIS — Z0001 Encounter for general adult medical examination with abnormal findings: Secondary | ICD-10-CM

## 2021-06-24 DIAGNOSIS — Z90711 Acquired absence of uterus with remaining cervical stump: Secondary | ICD-10-CM

## 2021-06-24 DIAGNOSIS — G8929 Other chronic pain: Secondary | ICD-10-CM

## 2021-06-24 DIAGNOSIS — F1190 Opioid use, unspecified, uncomplicated: Secondary | ICD-10-CM

## 2021-06-24 MED ORDER — LISINOPRIL 10 MG PO TABS: 10.0000 mg | ORAL_TABLET | Freq: Every day | ORAL | 3 refills | Status: DC

## 2021-06-24 MED ORDER — OXYCODONE-ACETAMINOPHEN 10-325 MG PO TABS: 0.5000 | ORAL_TABLET | Freq: Three times a day (TID) | ORAL | 0 refills | Status: DC | PRN

## 2021-06-24 MED ORDER — LORATADINE 10 MG PO TABS: 10.0000 mg | ORAL_TABLET | Freq: Every day | ORAL | 3 refills | Status: DC

## 2021-06-24 NOTE — Patient Instructions (Addendum)
Preventive Care 59-59 Years Old, Female Preventive care refers to lifestyle choices and visits with your health care provider that can promote health and wellness. This includes: A yearly physical exam. This is also called an annual wellness visit. Regular dental and eye exams. Immunizations. Screening for certain conditions. Healthy lifestyle choices, such as: Eating a healthy diet. Getting regular exercise. Not using drugs or products that contain nicotine and tobacco. Limiting alcohol use. What can I expect for my preventive care visit? Physical exam Your health care provider will check your: Height and weight. These may be used to calculate your BMI (body mass index). BMI is a measurement that tells if you are at a healthy weight. Heart rate and blood pressure. Body temperature. Skin for abnormal spots. Counseling Your health care provider may ask you questions about your: Past medical problems. Family's medical history. Alcohol, tobacco, and drug use. Emotional well-being. Home life and relationship well-being. Sexual activity. Diet, exercise, and sleep habits. Work and work Statistician. Access to firearms. Method of birth control. Menstrual cycle. Pregnancy history. What immunizations do I need?  Vaccines are usually given at various ages, according to a schedule. Your health care provider will recommend vaccines for you based on your age, medicalhistory, and lifestyle or other factors, such as travel or where you work. What tests do I need? Blood tests Lipid and cholesterol levels. These may be checked every 5 years, or more often if you are over 37 years old. Hepatitis C test. Hepatitis B test. Screening Lung cancer screening. You may have this screening every year starting at age 30 if you have a 30-pack-year history of smoking and currently smoke or have quit within the past 15 years. Colorectal cancer screening. All adults should have this screening starting at  age 23 and continuing until age 3. Your health care provider may recommend screening at age 59 if you are at increased risk. You will have tests every 1-10 years, depending on your results and the type of screening test. Diabetes screening. This is done by checking your blood sugar (glucose) after you have not eaten for a while (fasting). You may have this done every 1-3 years. Mammogram. This may be done every 1-2 years. Talk with your health care provider about when you should start having regular mammograms. This may depend on whether you have a family history of breast cancer. BRCA-related cancer screening. This may be done if you have a family history of breast, ovarian, tubal, or peritoneal cancers. Pelvic exam and Pap test. This may be done every 3 years starting at age 59. Starting at age 59, this may be done every 5 years if you have a Pap test in combination with an HPV test. Other tests STD (sexually transmitted disease) testing, if you are at risk. Bone density scan. This is done to screen for osteoporosis. You may have this scan if you are at high risk for osteoporosis. Talk with your health care provider about your test results, treatment options,and if necessary, the need for more tests. Follow these instructions at home: Eating and drinking  Eat a diet that includes fresh fruits and vegetables, whole grains, lean protein, and low-fat dairy products. Take vitamin and mineral supplements as recommended by your health care provider. Do not drink alcohol if: Your health care provider tells you not to drink. You are pregnant, may be pregnant, or are planning to become pregnant. If you drink alcohol: Limit how much you have to 0-1 drink a day. Be aware  of how much alcohol is in your drink. In the [place] one drink equals one 12 oz bottle of beer (355 mL), one 5 oz glass of wine (148 mL), or one 1 oz glass of hard liquor (44 mL).  Lifestyle Take daily care of your teeth and  gums. Brush your teeth every morning and night with fluoride toothpaste. Floss one time each day. Stay active. Exercise for at least 30 minutes 5 or more days each week. Do not use any products that contain nicotine or tobacco, such as cigarettes, e-cigarettes, and chewing tobacco. If you need help quitting, ask your health care provider. Do not use drugs. If you are sexually active, practice safe sex. Use a condom or other form of protection to prevent STIs (sexually transmitted infections). If you do not wish to become pregnant, use a form of birth control. If you plan to become pregnant, see your health care provider for a prepregnancy visit. If told by your health care provider, take low-dose aspirin daily starting at age 59. Find healthy ways to cope with stress, such as: Meditation, yoga, or listening to music. Journaling. Talking to a trusted person. Spending time with friends and family. Safety Always wear your seat belt while driving or riding in a vehicle. Do not drive: If you have been drinking alcohol. Do not ride with someone who has been drinking. When you are tired or distracted. While texting. Wear a helmet and other protective equipment during sports activities. If you have firearms in your house, make sure you follow all gun safety procedures. What's next? Visit your health care provider once a year for an annual wellness visit. Ask your health care provider how often you should have your eyes and teeth checked. Stay up to date on all vaccines. This information is not intended to replace advice given to you by your health care provider. Make sure you discuss any questions you have with your healthcare provider. Document Revised: 08/20/2020 Document Reviewed: 07/28/2018 Elsevier Patient Education  2022 Elsevier Inc.  

## 2021-06-24 NOTE — Progress Notes (Signed)
Ashley Marquez is a 60 y.o. female presents to office today for annual physical exam examination.    Concerns today include: 1.  Chronic pain Patient with known degenerative changes in the lumbar spine.  In fact it is gotten so bad during her work at [employer] that she has since switched employment to a daycare.  Symptoms seem to be improving as she is not on her feet all day.  She continues to take Percocet 3 times daily and denies any complications including constipation, dizziness, falls.  Occupation: daycare Diet: balanced, Exercise: currently none Last colonoscopy: UTD Last mammogram: needs Last pap smear: needs Immunizations needed: Shingles vaccine  There is no immunization history on file for this patient.    Past Medical History:  Diagnosis Date   Anxiety    Heart murmur    Social History   Socioeconomic History   Marital status: Divorced    Spouse name: Not on file   Number of children: 2   Years of education: Not on file   Highest education level: Not on file  Occupational History    Comment: [employer]  Tobacco Use   Smoking status: Never   Smokeless tobacco: Never  Vaping Use   Vaping Use: Never used  Substance and Sexual Activity   Alcohol use: No   Drug use: No   Sexual activity: Not on file  Other Topics Concern   Not on file  Social History Narrative   Not on file   Social Determinants of Health   Financial Resource Strain: Not on file  Food Insecurity: Not on file  Transportation Needs: Not on file  Physical Activity: Not on file  Stress: Not on file  Social Connections: Not on file  Intimate Partner Violence: Not on file   Past Surgical History:  Procedure Laterality Date   ABDOMINAL HYSTERECTOMY     None     Family History  Problem Relation Age of Onset   Emphysema Father 66    Current Outpatient Medications:    diclofenac (VOLTAREN) 75 MG EC tablet, Take 1 tablet (75 mg total) by mouth 2 (two) times daily as needed for moderate  pain., Disp: 60 tablet, Rfl: 1   fluticasone (FLONASE) 50 MCG/ACT nasal spray, Place 2 sprays into both nostrils daily., Disp: 16 g, Rfl: 6   ipratropium (ATROVENT) 0.03 % nasal spray, Place 2 sprays into both nostrils every 12 (twelve) hours for 3 days., Disp: 30 mL, Rfl: 0   lisinopril (ZESTRIL) 10 MG tablet, Take 1 tablet (10 mg total) by mouth daily. **Note change in strength, Disp: 90 tablet, Rfl: 3   loratadine (CLARITIN) 10 MG tablet, Take 1 tablet (10 mg total) by mouth daily., Disp: 90 tablet, Rfl: 3   omeprazole (PRILOSEC) 20 MG capsule, TAKE ONE (1) CAPSULE EACH DAY, Disp: 90 capsule, Rfl: 3   oxyCODONE-acetaminophen (PERCOCET) 10-325 MG tablet, Take 0.5-1 tablets by mouth every 8 (eight) hours as needed for pain., Disp: 75 tablet, Rfl: 0   oxyCODONE-acetaminophen (PERCOCET) 10-325 MG tablet, Take 0.5-1 tablets by mouth every 8 (eight) hours as needed for pain., Disp: 75 tablet, Rfl: 0   oxyCODONE-acetaminophen (PERCOCET) 10-325 MG tablet, Take 0.5-1 tablets by mouth every 8 (eight) hours as needed for pain., Disp: 75 tablet, Rfl: 0  Allergies  Allergen Reactions   Gabapentin     hallucination   Penicillins Nausea And Vomiting and Other (See Comments)    Patient slept for 2 days.  Has patient had a  PCN reaction causing immediate rash, facial/tongue/throat swelling, SOB or lightheadedness with hypotension: No Has patient had a PCN reaction causing severe rash involving mucus membranes or skin necrosis: No Has patient had a PCN reaction that required hospitalization No Has patient had a PCN reaction occurring within the last 10 years: No If all of the above answers are "NO", then may proceed with Cephalosporin use.      ROS: Review of Systems Pertinent items noted in HPI and remainder of comprehensive ROS otherwise negative.    Physical exam BP 135/79   Pulse 93   Temp 97.9 F (36.6 C) (Temporal)   Ht _0  (1.651 m)   Wt 138 lb (62.6 kg)   SpO2 99%   BMI 22.96 kg/m   General appearance: alert, cooperative, appears stated age, and no distress Head: Normocephalic, without obvious abnormality, atraumatic Eyes: negative findings: lids and lashes normal, conjunctivae and sclerae normal, corneas clear, and pupils equal, round, reactive to light and accomodation Ears: normal TM's and external ear canals both ears Nose: Nares normal. Septum midline. Mucosa normal. No drainage or sinus tenderness. Throat: lips, mucosa, and tongue normal; teeth and gums normal Neck: no adenopathy, supple, symmetrical, trachea midline, and thyroid not enlarged, symmetric, no tenderness/mass/nodules Back: symmetric, no curvature. ROM normal. No CVA tenderness. Lungs: clear to auscultation bilaterally Breasts: normal appearance, no masses or tenderness Heart: regular rate and rhythm, S1, S2 normal, no murmur, click, rub or gallop Abdomen: soft, non-tender; bowel sounds normal; no masses,  no organomegaly Pelvic: external genitalia normal, rectovaginal septum normal, and cervix surgically absent Extremities: extremities normal, atraumatic, no cyanosis or edema Pulses: 2+ and symmetric Skin: Skin color, texture, turgor normal. No rashes or lesions Lymph nodes: Cervical, supraclavicular, and axillary nodes normal. Neurologic: Alert and oriented X 3, normal strength and tone. Normal symmetric reflexes. Normal coordination and gait Psych: Mood stable, speech normal, affect appropriate   Assessment/ Plan: Garald Marquez here for annual physical exam.   Annual physical exam  Screening for malignant neoplasm of cervix - Plan: Cytology - PAP  History of partial hysterectomy  Chronic right-sided low back pain without sciatica - Plan: ToxASSURE Select 13 (MW), Urine, oxyCODONE-acetaminophen (PERCOCET) 10-325 MG tablet, oxyCODONE-acetaminophen (PERCOCET) 10-325 MG tablet, oxyCODONE-acetaminophen (PERCOCET) 10-325 MG tablet  DDD (degenerative disc disease), lumbar - Plan: ToxASSURE  Select 13 (MW), Urine, oxyCODONE-acetaminophen (PERCOCET) 10-325 MG tablet, oxyCODONE-acetaminophen (PERCOCET) 10-325 MG tablet, oxyCODONE-acetaminophen (PERCOCET) 10-325 MG tablet  Chronic, continuous use of opioids - Plan: ToxASSURE Select 13 (MW), Urine  Post-traumatic osteoarthritis of left knee - Plan: oxyCODONE-acetaminophen (PERCOCET) 10-325 MG tablet, oxyCODONE-acetaminophen (PERCOCET) 10-325 MG tablet, oxyCODONE-acetaminophen (PERCOCET) 10-325 MG tablet  Essential hypertension - Plan: CMP14+EGFR  Seasonal allergies - Plan: loratadine (CLARITIN) 10 MG tablet  She will schedule mammogram at checkout.  Surgical absence of the cervix noted on today's exam.  Her vaginal vault is what was swabbed but anticipate no further need for cervical cancer screening unless clinical concerns arise  Chronic pain is chronic and stable.  National narcotic database was reviewed and there were no red flags.  She is up-to-date on controlled substance contract but we will reobtain a urine drug screen as her last one was negative due to lapse in medication.  Blood pressure is well controlled.  Medication has been renewed  Allergies controlled with Claritin.  Renewed  We reviewed the risk of dependence, abuse, falls, and other adverse events related to opioids.  Extreme caution reinforced with medication use.  Stool softener for prevention  of constipation. Hydrate well.  The Narcotic Database has been reviewed.  There were no red flags.  Last refill: 05/23/21.  Follow up in 3 months for interval check up/ monitoring.  Counseled on healthy lifestyle choices, including diet (rich in fruits, vegetables and lean meats and low in salt and simple carbohydrates) and exercise (at least 30 minutes of moderate physical activity daily).  Ahmiya Abee M. Lajuana Ripple, DO

## 2021-06-24 NOTE — Addendum Note (Signed)
Addended by: Raliegh Ip on: 06/24/2021 04:43 PM   Modules accepted: Orders

## 2021-06-25 LAB — CMP14+EGFR
ALT: 16 IU/L (ref 0–32)
AST: 14 IU/L (ref 0–40)
Albumin/Globulin Ratio: 1.9 (ref 1.2–2.2)
Albumin: 4.9 g/dL (ref 3.8–4.9)
Alkaline Phosphatase: 107 IU/L (ref 44–121)
BUN/Creatinine Ratio: 21 (ref 9–23)
BUN: 17 mg/dL (ref 6–24)
Bilirubin Total: 0.4 mg/dL (ref 0.0–1.2)
CO2: 25 mmol/L (ref 20–29)
Calcium: 9.6 mg/dL (ref 8.7–10.2)
Chloride: 105 mmol/L (ref 96–106)
Creatinine, Ser: 0.82 mg/dL (ref 0.57–1.00)
Globulin, Total: 2.6 g/dL (ref 1.5–4.5)
Glucose: 101 mg/dL — ABNORMAL HIGH (ref 65–99)
Potassium: 4.2 mmol/L (ref 3.5–5.2)
Sodium: 145 mmol/L — ABNORMAL HIGH (ref 134–144)
Total Protein: 7.5 g/dL (ref 6.0–8.5)
eGFR: 82 mL/min/{1.73_m2} (ref >59)

## 2021-06-26 LAB — CYTOLOGY - PAP
Adequacy: ABSENT
Comment: NEGATIVE
Diagnosis: NEGATIVE
High risk HPV: NEGATIVE

## 2021-06-26 LAB — TOXASSURE SELECT 13 (MW), URINE

## 2021-07-10 ENCOUNTER — Other Ambulatory Visit: Payer: Self-pay | Admitting: Family Medicine

## 2021-07-10 DIAGNOSIS — Z1231 Encounter for screening mammogram for malignant neoplasm of breast: Secondary | ICD-10-CM

## 2021-09-29 ENCOUNTER — Ambulatory Visit (INDEPENDENT_AMBULATORY_CARE_PROVIDER_SITE_OTHER): Payer: Self-pay | Admitting: Family Medicine

## 2021-09-29 ENCOUNTER — Encounter: Payer: Self-pay | Admitting: Family Medicine

## 2021-09-29 ENCOUNTER — Other Ambulatory Visit: Payer: Self-pay

## 2021-09-29 VITALS — BP 145/80 | HR 82 | Temp 97.7°F | Ht 65.0 in | Wt 142.4 lb

## 2021-09-29 DIAGNOSIS — M1732 Unilateral post-traumatic osteoarthritis, left knee: Secondary | ICD-10-CM

## 2021-09-29 DIAGNOSIS — M5136 Other intervertebral disc degeneration, lumbar region: Secondary | ICD-10-CM

## 2021-09-29 DIAGNOSIS — H04551 Acquired stenosis of right nasolacrimal duct: Secondary | ICD-10-CM

## 2021-09-29 DIAGNOSIS — M545 Low back pain, unspecified: Secondary | ICD-10-CM

## 2021-09-29 DIAGNOSIS — G8929 Other chronic pain: Secondary | ICD-10-CM

## 2021-09-29 MED ORDER — OXYCODONE-ACETAMINOPHEN 10-325 MG PO TABS: 0.5000 | ORAL_TABLET | Freq: Three times a day (TID) | ORAL | 0 refills | Status: DC | PRN

## 2021-09-29 NOTE — Patient Instructions (Signed)
Dacryocystitis Dacryocystitis is an infection of the sac that collects tears (lacrimal sac). The lacrimal sac is located between the inner corner of the eye and the nose. The glands of the eyelids make tears that keep the surface of the eye wet and protected. Tears drain from two small tubes (ducts) in the eyelids. These ducts carry tears to the lacrimal sac. Another tube (nasolacrimal duct) carries tears from the lacrimal sac down into the back of the nose to the throat. Dacryocystitis can be sudden (acute) or long-lasting (chronic). It usually affects only one eye. What are the causes? The most common cause of this condition is a blocked nasolacrimal duct. When this duct is blocked, tears cannot drain into the nose, and tears become backed up in the lacrimal sac. Bacteria that normally live in the eye, on the skin, or in the nose start to grow inside the sac and cause infection. The nasolacrimal duct may become blocked because of: A nose or sinus infection that spreads into the duct. A duct that is abnormally shaped (malformed). A growth or swelling in the nose. An injury or surgery that narrows or scars the duct. Dacryocystitis also may start as an eye infection that spreads to the lacrimal sac. Sometimes, the cause of dacryocystitis is not known. What increases the risk? You are more likely to develop this condition if you: Are older than 59 years of age. Are female. Women tend to have a narrower nasolacrimal duct than men. Have had nasal trauma, such as a broken nose or nasal surgery. Have nasal polyps. What are the signs or symptoms? Symptoms of acute dacryocystitis start suddenly and may include: Excessive tearing. A matted, watery eye. Swelling and redness over the lacrimal sac. Discharge of mucus or pus into the eye. This may cause blurred vision. Eye pain. A fever. Symptoms of chronic dacryocystitis usually include: More tearing than usual. Discharge of mucus or pus into the  eye. Blurred vision. Redness, pain, and swelling are less common with chronic dacryocystitis. How is this diagnosed? This condition is diagnosed based on your medical history and a physical exam. During the exam, your health care provider may press between your eye and the side of your nose to see if discharge flows back into your eye. You may also have tests, such as: Removal of a sample of discharge from your eye or nose to check for infection. A test where your health care provider will put a yellow dye in your eye to see if the dye disappears from your eye (dye disappearance test). A swab may be placed in your nose to see if the dye drains to your nose. A test where a thin, lighted scope (endoscope) is placed in your nose to determine what is causing the duct blockage (nasal endoscopy). How is this treated? Acute dacryocystitis is treated with antibiotic medicines. These are usually given by mouth (orally), but they can also be given as eye drops or ointments. If the infection has spread to tissues around the eye (orbital cellulitis), antibiotics may be given through an IV. Chronic dacryocystitis usually needs to be treated with surgery. Surgical options include: Probing the duct to open it. Widening the duct. Removing a nasal blockage. Follow these instructions at home: Medicines Take over-the-counter and prescription medicines only as told by your health care provider. Take or apply your antibiotic medicine, drops, or ointment as told by your health care provider. Do not stop taking or applying the antibiotic even if you start to feel better.   General instructions If directed by your health care provider, apply a clean, warm compress to the inside corner of your eye. To do this: Wash your hands first. Hold the compress over the inside corner of your eye for a few minutes. Repeat this every few hours during the day. Keep all follow-up visits as told by your health care provider. This is  important. Contact a health care provider if: You have a fever. Your symptoms come back, do not improve, or get worse. Get help right away if you have: Redness, swelling, and pain that spread to the tissues around your eye. A sudden decrease in your vision. Summary Dacryocystitis can be sudden (acute) or long-lasting (chronic). The most common cause of this condition is a blocked nasolacrimal duct. Acute dacryocystitis is treated with antibiotic medicines. Chronic dacryocystitis usually needs to be treated with surgery. Keep all follow-up visits as told by your health care provider. This is important. This information is not intended to replace advice given to you by your health care provider. Make sure you discuss any questions you have with your health care provider. Document Revised: 10/11/2018 Document Reviewed: 10/11/2018 Elsevier Patient Education  2022 [employer] Inc.  

## 2021-09-29 NOTE — Progress Notes (Signed)
Subjective: CC: Follow-up chronic pain PCP: Raliegh Ip, DO Ashley Marquez is a 60 y.o. female presenting to clinic today for:  1.  Chronic back pain Patient is compliant with medications.  She notes that she has been using her medication a little more frequently than typical since she started a job with the [employer].  She will be transferring to [employer] after her training and anticipates that she will be able to go back to her normal dosing schedule.  Denies any constipation, falls, drowsiness.  2.  Facial fullness Patient reports that she started having some swelling right around the bridge of her nose on the right side this morning.  She admits that sensation of dry eye in the right eye.  Reports minimal tenderness.  No drainage.  Has not tried anything to remedy   ROS: Per HPI  Allergies  Allergen Reactions   Gabapentin     hallucination   Penicillins Nausea And Vomiting and Other (See Comments)    Patient slept for 2 days.  Has patient had a PCN reaction causing immediate rash, facial/tongue/throat swelling, SOB or lightheadedness with hypotension: No Has patient had a PCN reaction causing severe rash involving mucus membranes or skin necrosis: No Has patient had a PCN reaction that required hospitalization No Has patient had a PCN reaction occurring within the last 10 years: No If all of the above answers are "NO", then may proceed with Cephalosporin use.    Past Medical History:  Diagnosis Date   Anxiety    Heart murmur     Current Outpatient Medications:    diclofenac (VOLTAREN) 75 MG EC tablet, Take 1 tablet (75 mg total) by mouth 2 (two) times daily as needed for moderate pain., Disp: 60 tablet, Rfl: 1   fluticasone (FLONASE) 50 MCG/ACT nasal spray, Place 2 sprays into both nostrils daily., Disp: 16 g, Rfl: 6   lisinopril (ZESTRIL) 10 MG tablet, Take 1 tablet (10 mg total) by mouth daily. **Note change in strength, Disp: 90 tablet, Rfl: 3   loratadine  (CLARITIN) 10 MG tablet, Take 1 tablet (10 mg total) by mouth daily., Disp: 90 tablet, Rfl: 3   omeprazole (PRILOSEC) 20 MG capsule, TAKE ONE (1) CAPSULE EACH DAY, Disp: 90 capsule, Rfl: 3   oxyCODONE-acetaminophen (PERCOCET) 10-325 MG tablet, Take 0.5-1 tablets by mouth every 8 (eight) hours as needed for pain., Disp: 75 tablet, Rfl: 0   oxyCODONE-acetaminophen (PERCOCET) 10-325 MG tablet, Take 0.5-1 tablets by mouth every 8 (eight) hours as needed for pain., Disp: 75 tablet, Rfl: 0   oxyCODONE-acetaminophen (PERCOCET) 10-325 MG tablet, Take 0.5-1 tablets by mouth every 8 (eight) hours as needed for pain., Disp: 75 tablet, Rfl: 0 Social History   Socioeconomic History   Marital status: Divorced    Spouse name: Not on file   Number of children: 2   Years of education: Not on file   Highest education level: Not on file  Occupational History    Comment: [employer]  Tobacco Use   Smoking status: Never   Smokeless tobacco: Never  Vaping Use   Vaping Use: Never used  Substance and Sexual Activity   Alcohol use: No   Drug use: No   Sexual activity: Not Currently  Other Topics Concern   Not on file  Social History Narrative   Not on file   Social Determinants of Health   Financial Resource Strain: Not on file  Food Insecurity: Not on file  Transportation Needs: Not  on file  Physical Activity: Not on file  Stress: Not on file  Social Connections: Not on file  Intimate Partner Violence: Not on file   Family History  Problem Relation Age of Onset   Emphysema Father 63    Objective: Office vital signs reviewed. BP (!) 159/82   Pulse 82   Temp 97.7 F (36.5 C)   Ht 5\' 5"  (1.651 m)   Wt 142 lb 6.4 oz (64.6 kg)   SpO2 100%   BMI 23.70 kg/m   Physical Examination:  General: Awake, alert, well nourished, No acute distress HEENT: Right medial aspect of the face with slight fullness appreciated.  Lacrimal duct looks normal.  She demonstrates no erythema or drainage.  No  tenderness to palpation. MSK: Ambulating independently with normal gait  Assessment/ Plan: 60 y.o. female   Chronic right-sided low back pain without sciatica - Plan: oxyCODONE-acetaminophen (PERCOCET) 10-325 MG tablet, oxyCODONE-acetaminophen (PERCOCET) 10-325 MG tablet, oxyCODONE-acetaminophen (PERCOCET) 10-325 MG tablet  DDD (degenerative disc disease), lumbar - Plan: oxyCODONE-acetaminophen (PERCOCET) 10-325 MG tablet, oxyCODONE-acetaminophen (PERCOCET) 10-325 MG tablet, oxyCODONE-acetaminophen (PERCOCET) 10-325 MG tablet  Post-traumatic osteoarthritis of left knee - Plan: oxyCODONE-acetaminophen (PERCOCET) 10-325 MG tablet, oxyCODONE-acetaminophen (PERCOCET) 10-325 MG tablet, oxyCODONE-acetaminophen (PERCOCET) 10-325 MG tablet  Obstruction of right lacrimal duct  Pain has been slightly exacerbated with her new job.  Advised only use medication as directed as she is at maximum dosing that I am willing to prescribe here.  She was good understanding.  National narcotic database was reviewed and there were no red flags.  Refills were appropriate.  Postdated scripts have been sent to her pharmacy  I suspect that she has a little lacrimal duct obstruction on the right.  Discussed massage, use of heat.  If she develops any concerning symptoms or infection she will contact me immediately and we can prescribe accordingly  No orders of the defined types were placed in this encounter.  No orders of the defined types were placed in this encounter.    47, DO [facility] [phone]

## 2021-11-27 ENCOUNTER — Telehealth: Payer: Self-pay | Admitting: Family Medicine

## 2021-11-27 NOTE — Telephone Encounter (Signed)
Pt was given 90 day supply on 10/31. Looks like pt still has one refill left.   Left message informing pt of this and that this medication cannot be prescribed outside of an OV. Asked pt to call her pharmacy to clarify refill and to call us back if needed.

## 2021-12-30 ENCOUNTER — Ambulatory Visit (INDEPENDENT_AMBULATORY_CARE_PROVIDER_SITE_OTHER): Payer: 59 | Admitting: Family Medicine

## 2021-12-30 ENCOUNTER — Encounter: Payer: Self-pay | Admitting: Family Medicine

## 2021-12-30 VITALS — BP 156/88 | HR 76 | Temp 98.3°F | Ht 65.0 in | Wt 154.4 lb

## 2021-12-30 DIAGNOSIS — M1732 Unilateral post-traumatic osteoarthritis, left knee: Secondary | ICD-10-CM

## 2021-12-30 DIAGNOSIS — M5136 Other intervertebral disc degeneration, lumbar region: Secondary | ICD-10-CM | POA: Diagnosis not present

## 2021-12-30 DIAGNOSIS — R030 Elevated blood-pressure reading, without diagnosis of hypertension: Secondary | ICD-10-CM | POA: Diagnosis not present

## 2021-12-30 DIAGNOSIS — M545 Low back pain, unspecified: Secondary | ICD-10-CM | POA: Diagnosis not present

## 2021-12-30 DIAGNOSIS — G8929 Other chronic pain: Secondary | ICD-10-CM

## 2021-12-30 DIAGNOSIS — K219 Gastro-esophageal reflux disease without esophagitis: Secondary | ICD-10-CM

## 2021-12-30 MED ORDER — OXYCODONE-ACETAMINOPHEN 10-325 MG PO TABS: 0.5000 | ORAL_TABLET | Freq: Three times a day (TID) | ORAL | 0 refills | Status: DC | PRN

## 2021-12-30 MED ORDER — OMEPRAZOLE 20 MG PO CPDR: DELAYED_RELEASE_CAPSULE | ORAL | 3 refills | Status: DC

## 2021-12-30 NOTE — Progress Notes (Signed)
Subjective: CC: Chronic pain follow-up PCP: [name], DO [id number] Ashley Marquez is a 61 y.o. female presenting to clinic today for:  1.  Chronic pain management/hypertension Patient reports that she has been having some exacerbations in her low back.  She unfortunately has not been able to use her Percocet for the last several days.  She is currently taking care of her demented mother who apparently "clean the kitchen" and subsequently threw out her impulsive medication.  She has has not taken her blood pressure medication either.  She reports no issues when she does take the medication with constipation, dizziness, lightheadedness, falls, excessive daytime sedation.  Again has not taken her lisinopril.  Denies any chest pain, shortness of breath, edema, headache or visual disturbance   ROS: Per HPI  Allergies  Allergen Reactions   Gabapentin     hallucination   Penicillins Nausea And Vomiting and Other (See Comments)    Patient slept for 2 days.  Has patient had a PCN reaction causing immediate rash, facial/tongue/throat swelling, SOB or lightheadedness with hypotension: No Has patient had a PCN reaction causing severe rash involving mucus membranes or skin necrosis: No Has patient had a PCN reaction that required hospitalization No Has patient had a PCN reaction occurring within the last 10 years: No If all of the above answers are "NO", then may proceed with Cephalosporin use.    Past Medical History:  Diagnosis Date   Anxiety    Heart murmur     Current Outpatient Medications:    diclofenac (VOLTAREN) 75 MG EC tablet, Take 1 tablet (75 mg total) by mouth 2 (two) times daily as needed for moderate pain., Disp: 60 tablet, Rfl: 1   fluticasone (FLONASE) 50 MCG/ACT nasal spray, Place 2 sprays into both nostrils daily., Disp: 16 g, Rfl: 6   lisinopril (ZESTRIL) 10 MG tablet, Take 1 tablet (10 mg total) by mouth daily. **Note change in strength, Disp: 90 tablet, Rfl: 3    loratadine (CLARITIN) 10 MG tablet, Take 1 tablet (10 mg total) by mouth daily., Disp: 90 tablet, Rfl: 3   oxyCODONE-acetaminophen (PERCOCET) 10-325 MG tablet, Take 0.5-1 tablets by mouth every 8 (eight) hours as needed for pain., Disp: 75 tablet, Rfl: 0   omeprazole (PRILOSEC) 20 MG capsule, TAKE ONE (1) CAPSULE EACH DAY, Disp: 90 capsule, Rfl: 3   oxyCODONE-acetaminophen (PERCOCET) 10-325 MG tablet, Take 0.5-1 tablets by mouth every 8 (eight) hours as needed for pain. (Patient not taking: Reported on 12/30/2021), Disp: 75 tablet, Rfl: 0   oxyCODONE-acetaminophen (PERCOCET) 10-325 MG tablet, Take 0.5-1 tablets by mouth every 8 (eight) hours as needed for pain. (Patient not taking: Reported on 12/30/2021), Disp: 75 tablet, Rfl: 0 Social History   Socioeconomic History   Marital status: Divorced    Spouse name: Not on file   Number of children: 2   Years of education: Not on file   Highest education level: Not on file  Occupational History    Comment: [employer]  Tobacco Use   Smoking status: Never   Smokeless tobacco: Never  Vaping Use   Vaping Use: Never used  Substance and Sexual Activity   Alcohol use: No   Drug use: No   Sexual activity: Not Currently  Other Topics Concern   Not on file  Social History Narrative   Not on file   Social Determinants of Health   Financial Resource Strain: Not on file  Food Insecurity: Not on file  Transportation Needs: Not  on file  Physical Activity: Not on file  Stress: Not on file  Social Connections: Not on file  Intimate Partner Violence: Not on file   Family History  Problem Relation Age of Onset   Emphysema Father 69    Objective: Office vital signs reviewed. BP (!) 156/88    Pulse 76    Temp 98.3 F (36.8 C)    Ht 5\' 5"  (1.651 m)    Wt 154 lb 6.4 oz (70 kg)    SpO2 99%    BMI 25.69 kg/m   Physical Examination:  General: Awake, alert, well nourished, well-appearing female, No acute distress HEENT: Sclera [place] Cardio: regular  rate and rhythm, S1S2 heard, no murmurs appreciated Pulm: clear to auscultation bilaterally, no wheezes, rhonchi or rales; normal work of breathing on room air Extremities: warm, well perfused, No edema, cyanosis or clubbing; +2 pulses bilaterally MSK: Ambulating independently.  Assessment/ Plan: 61 y.o. female   Chronic right-sided low back pain without sciatica - Plan: oxyCODONE-acetaminophen (PERCOCET) 10-325 MG tablet, oxyCODONE-acetaminophen (PERCOCET) 10-325 MG tablet, oxyCODONE-acetaminophen (PERCOCET) 10-325 MG tablet  DDD (degenerative disc disease), lumbar - Plan: oxyCODONE-acetaminophen (PERCOCET) 10-325 MG tablet, oxyCODONE-acetaminophen (PERCOCET) 10-325 MG tablet, oxyCODONE-acetaminophen (PERCOCET) 10-325 MG tablet  Post-traumatic osteoarthritis of left knee - Plan: oxyCODONE-acetaminophen (PERCOCET) 10-325 MG tablet, oxyCODONE-acetaminophen (PERCOCET) 10-325 MG tablet, oxyCODONE-acetaminophen (PERCOCET) 10-325 MG tablet  Elevated BP without diagnosis of hypertension  Gastroesophageal reflux disease without esophagitis - Plan: omeprazole (PRILOSEC) 20 MG capsule  Chronic pain is not controlled currently but she unfortunately is been out of her medications for the last several days.  I have renewed her meds and sent in postdated scripts.  The national narcotic database was reviewed and there were no red flags.  She is up-to-date on UDS and CSC but we will need to update these again at her next visit.  I encouraged her to lock up her medications so that she does not run out prematurely again.  She has in fact purchased a lock box  Her blood pressure is elevated but this is likely secondary to noncompliance with the medication.  I have asked that she resume use of her medication and we will recheck her blood pressure again at her next visit  GERD was not discussed during today's visit but PPI was renewed  She will return her Cologuard ASAP  No orders of the defined types were  placed in this encounter.  Meds ordered this encounter  Medications   omeprazole (PRILOSEC) 20 MG capsule    Sig: TAKE ONE (1) CAPSULE EACH DAY    Dispense:  90 capsule    Refill:  3   oxyCODONE-acetaminophen (PERCOCET) 10-325 MG tablet    Sig: Take 0.5-1 tablets by mouth every 8 (eight) hours as needed for pain.    Dispense:  75 tablet    Refill:  0   oxyCODONE-acetaminophen (PERCOCET) 10-325 MG tablet    Sig: Take 0.5-1 tablets by mouth every 8 (eight) hours as needed for pain.    Dispense:  75 tablet    Refill:  0   oxyCODONE-acetaminophen (PERCOCET) 10-325 MG tablet    Sig: Take 0.5-1 tablets by mouth every 8 (eight) hours as needed for pain.    Dispense:  75 tablet    Refill:  Frederickson, DO [place] [phone]

## 2022-03-30 ENCOUNTER — Ambulatory Visit (INDEPENDENT_AMBULATORY_CARE_PROVIDER_SITE_OTHER): Payer: 59 | Admitting: Family Medicine

## 2022-03-30 ENCOUNTER — Encounter: Payer: Self-pay | Admitting: Family Medicine

## 2022-03-30 VITALS — BP 132/77 | HR 76 | Temp 98.0°F | Ht 65.0 in | Wt 144.4 lb

## 2022-03-30 DIAGNOSIS — G8929 Other chronic pain: Secondary | ICD-10-CM

## 2022-03-30 DIAGNOSIS — M5136 Other intervertebral disc degeneration, lumbar region: Secondary | ICD-10-CM | POA: Diagnosis not present

## 2022-03-30 DIAGNOSIS — M1732 Unilateral post-traumatic osteoarthritis, left knee: Secondary | ICD-10-CM | POA: Diagnosis not present

## 2022-03-30 DIAGNOSIS — Z79899 Other long term (current) drug therapy: Secondary | ICD-10-CM

## 2022-03-30 DIAGNOSIS — M545 Low back pain, unspecified: Secondary | ICD-10-CM | POA: Diagnosis not present

## 2022-03-30 MED ORDER — OXYCODONE-ACETAMINOPHEN 10-325 MG PO TABS: 0.5000 | ORAL_TABLET | Freq: Three times a day (TID) | ORAL | 0 refills | Status: DC | PRN

## 2022-03-30 NOTE — Progress Notes (Signed)
? ?Subjective: ?CC: Chronic pain follow-up ?PCP: [name], DO ?[id number] E Ashley Marquez is a 61 y.o. female presenting to clinic today for: ? ?1.  28-month for chronic pain follow-up ?Patient has stable chronic pain.  She continues to work.  She takes her Percocet up to 3 times a day as needed.  Does not report excessive daytime sedation, falls, visual or auditory hallucinations.  No constipation reported. ? ? ?ROS: Per HPI ? ?Allergies  ?Allergen Reactions  ? Gabapentin   ?  hallucination  ? Penicillins Nausea And Vomiting and Other (See Comments)  ?  Patient slept for 2 days. ? ?Has patient had a PCN reaction causing immediate rash, facial/tongue/throat swelling, SOB or lightheadedness with hypotension: No ?Has patient had a PCN reaction causing severe rash involving mucus membranes or skin necrosis: No ?Has patient had a PCN reaction that required hospitalization No ?Has patient had a PCN reaction occurring within the last 10 years: No ?If all of the above answers are "NO", then may proceed with Cephalosporin use. ?  ? ?Past Medical History:  ?Diagnosis Date  ? Anxiety   ? Heart murmur   ? ? ?Current Outpatient Medications:  ?  diclofenac (VOLTAREN) 75 MG EC tablet, Take 1 tablet (75 mg total) by mouth 2 (two) times daily as needed for moderate pain., Disp: 60 tablet, Rfl: 1 ?  fluticasone (FLONASE) 50 MCG/ACT nasal spray, Place 2 sprays into both nostrils daily., Disp: 16 g, Rfl: 6 ?  lisinopril (ZESTRIL) 10 MG tablet, Take 1 tablet (10 mg total) by mouth daily. **Note change in strength, Disp: 90 tablet, Rfl: 3 ?  loratadine (CLARITIN) 10 MG tablet, Take 1 tablet (10 mg total) by mouth daily., Disp: 90 tablet, Rfl: 3 ?  omeprazole (PRILOSEC) 20 MG capsule, TAKE ONE (1) CAPSULE EACH DAY, Disp: 90 capsule, Rfl: 3 ?  oxyCODONE-acetaminophen (PERCOCET) 10-325 MG tablet, Take 0.5-1 tablets by mouth every 8 (eight) hours as needed for pain., Disp: 75 tablet, Rfl: 0 ?  oxyCODONE-acetaminophen (PERCOCET) 10-325 MG  tablet, Take 0.5-1 tablets by mouth every 8 (eight) hours as needed for pain., Disp: 75 tablet, Rfl: 0 ?  oxyCODONE-acetaminophen (PERCOCET) 10-325 MG tablet, Take 0.5-1 tablets by mouth every 8 (eight) hours as needed for pain., Disp: 75 tablet, Rfl: 0 ?Social History  ? ?Socioeconomic History  ? Marital status: Divorced  ?  Spouse name: Not on file  ? Number of children: 2  ? Years of education: Not on file  ? Highest education level: Not on file  ?Occupational History  ?  Comment: [employer]  ?Tobacco Use  ? Smoking status: Never  ? Smokeless tobacco: Never  ?Vaping Use  ? Vaping Use: Never used  ?Substance and Sexual Activity  ? Alcohol use: No  ? Drug use: No  ? Sexual activity: Not Currently  ?Other Topics Concern  ? Not on file  ?Social History Narrative  ? Not on file  ? ?Social Determinants of Health  ? ?Financial Resource Strain: Not on file  ?Food Insecurity: Not on file  ?Transportation Needs: Not on file  ?Physical Activity: Not on file  ?Stress: Not on file  ?Social Connections: Not on file  ?Intimate Partner Violence: Not on file  ? ?Family History  ?Problem Relation Age of Onset  ? Emphysema Father 43  ? ? ?Objective: ?Office vital signs reviewed. ?BP 132/77   Pulse 76   Temp 98 ?F (36.7 ?C)   Ht 5\' 5"  (1.651 m)   Wt  144 lb 6.4 oz (65.5 kg)   SpO2 98%   BMI 24.03 kg/m?  ? ?Physical Examination:  ?General: Awake, alert, well nourished, No acute distress ?HEENT: Sclera [place].  Moist mucous membranes ?Cardio: regular rate and rhythm  ?Pulm: normal work of breathing on room air ?MSK: Ambulating independently.  Normal gait and station. ? ?Assessment/ Plan: ?61 y.o. female  ? ?Chronic right-sided low back pain without sciatica - Plan: ToxASSURE Select 13 (MW), Urine, oxyCODONE-acetaminophen (PERCOCET) 10-325 MG tablet, oxyCODONE-acetaminophen (PERCOCET) 10-325 MG tablet, oxyCODONE-acetaminophen (PERCOCET) 10-325 MG tablet, CANCELED: ToxASSURE Select 13 (MW), Urine ? ?DDD (degenerative disc disease),  lumbar - Plan: ToxASSURE Select 13 (MW), Urine, oxyCODONE-acetaminophen (PERCOCET) 10-325 MG tablet, oxyCODONE-acetaminophen (PERCOCET) 10-325 MG tablet, oxyCODONE-acetaminophen (PERCOCET) 10-325 MG tablet, CANCELED: ToxASSURE Select 13 (MW), Urine ? ?Post-traumatic osteoarthritis of left knee - Plan: ToxASSURE Select 13 (MW), Urine, oxyCODONE-acetaminophen (PERCOCET) 10-325 MG tablet, oxyCODONE-acetaminophen (PERCOCET) 10-325 MG tablet, oxyCODONE-acetaminophen (PERCOCET) 10-325 MG tablet, CANCELED: ToxASSURE Select 13 (MW), Urine ? ?Controlled substance agreement signed - Plan: ToxASSURE Select 13 (MW), Urine, CANCELED: ToxASSURE Select 13 (MW), Urine ? ?Stable.  Controlled substance contract and urine drug screen updated as per office policy.  National narcotic database reviewed and there were no red flags. ? ?No orders of the defined types were placed in this encounter. ? ?No orders of the defined types were placed in this encounter. ? ? ? ?[name], DO ?[place] ?[phone] ? ?

## 2022-04-03 LAB — TOXASSURE SELECT 13 (MW), URINE

## 2022-06-30 ENCOUNTER — Ambulatory Visit (INDEPENDENT_AMBULATORY_CARE_PROVIDER_SITE_OTHER): Payer: 59 | Admitting: Family Medicine

## 2022-06-30 ENCOUNTER — Encounter: Payer: Self-pay | Admitting: Family Medicine

## 2022-06-30 VITALS — BP 142/65 | HR 74 | Temp 97.2°F | Ht 65.0 in | Wt 144.2 lb

## 2022-06-30 DIAGNOSIS — M1732 Unilateral post-traumatic osteoarthritis, left knee: Secondary | ICD-10-CM

## 2022-06-30 DIAGNOSIS — M545 Low back pain, unspecified: Secondary | ICD-10-CM | POA: Diagnosis not present

## 2022-06-30 DIAGNOSIS — M5136 Other intervertebral disc degeneration, lumbar region: Secondary | ICD-10-CM | POA: Diagnosis not present

## 2022-06-30 DIAGNOSIS — I10 Essential (primary) hypertension: Secondary | ICD-10-CM

## 2022-06-30 DIAGNOSIS — K219 Gastro-esophageal reflux disease without esophagitis: Secondary | ICD-10-CM

## 2022-06-30 DIAGNOSIS — G8929 Other chronic pain: Secondary | ICD-10-CM

## 2022-06-30 DIAGNOSIS — F1190 Opioid use, unspecified, uncomplicated: Secondary | ICD-10-CM | POA: Diagnosis not present

## 2022-06-30 MED ORDER — OXYCODONE-ACETAMINOPHEN 10-325 MG PO TABS: 0.5000 | ORAL_TABLET | Freq: Three times a day (TID) | ORAL | 0 refills | Status: DC | PRN

## 2022-06-30 MED ORDER — OMEPRAZOLE 20 MG PO CPDR: DELAYED_RELEASE_CAPSULE | ORAL | 3 refills | Status: DC

## 2022-06-30 MED ORDER — LISINOPRIL 20 MG PO TABS: 20.0000 mg | ORAL_TABLET | Freq: Every day | ORAL | 0 refills | Status: DC

## 2022-06-30 NOTE — Patient Instructions (Signed)
Lisinopril dose double to 20mg .  Continue to monitor blood pressure. Return in 2 weeks for recheck BP with nurse.

## 2022-06-30 NOTE — Progress Notes (Signed)
Subjective: CC: Follow-up hypertension, chronic pain PCP: [name], DO [id number] E Bucy is a 61 y.o. female presenting to clinic today for:  1.  Hypertension Patient's lisinopril was increased to 10 mg last visit but she does the blood pressures remain high at home.  No reports chest pain, shortness of breath, dizziness or headaches.  She does report increased stress at home due to the health of her mother whom she suspects has dementia and the health of her grandson who is suspected to have ADHD/autism.  2.  Chronic pain Patient with known degenerative changes in the spine.  She chronically takes Percocet twice daily and up to 3 times daily if needed for breakthrough pain.  Denies any excessive daytime sedation, falls, respiratory depression or constipation.   ROS: Per HPI  Allergies  Allergen Reactions   Gabapentin     hallucination   Penicillins Nausea And Vomiting and Other (See Comments)    Patient slept for 2 days.  Has patient had a PCN reaction causing immediate rash, facial/tongue/throat swelling, SOB or lightheadedness with hypotension: No Has patient had a PCN reaction causing severe rash involving mucus membranes or skin necrosis: No Has patient had a PCN reaction that required hospitalization No Has patient had a PCN reaction occurring within the last 10 years: No If all of the above answers are "NO", then may proceed with Cephalosporin use.    Past Medical History:  Diagnosis Date   Anxiety    Heart murmur     Current Outpatient Medications:    diclofenac (VOLTAREN) 75 MG EC tablet, Take 1 tablet (75 mg total) by mouth 2 (two) times daily as needed for moderate pain., Disp: 60 tablet, Rfl: 1   fluticasone (FLONASE) 50 MCG/ACT nasal spray, Place 2 sprays into both nostrils daily., Disp: 16 g, Rfl: 6   loratadine (CLARITIN) 10 MG tablet, Take 1 tablet (10 mg total) by mouth daily., Disp: 90 tablet, Rfl: 3   lisinopril (ZESTRIL) 20 MG tablet, Take 1  tablet (20 mg total) by mouth daily. **Note change in strength, Disp: 90 tablet, Rfl: 0   omeprazole (PRILOSEC) 20 MG capsule, TAKE ONE (1) CAPSULE EACH DAY, Disp: 90 capsule, Rfl: 3   oxyCODONE-acetaminophen (PERCOCET) 10-325 MG tablet, Take 0.5-1 tablets by mouth every 8 (eight) hours as needed for pain., Disp: 75 tablet, Rfl: 0   [START ON 08/29/2022] oxyCODONE-acetaminophen (PERCOCET) 10-325 MG tablet, Take 0.5-1 tablets by mouth every 8 (eight) hours as needed for pain., Disp: 75 tablet, Rfl: 0   [START ON 07/30/2022] oxyCODONE-acetaminophen (PERCOCET) 10-325 MG tablet, Take 0.5-1 tablets by mouth every 8 (eight) hours as needed for pain., Disp: 75 tablet, Rfl: 0 Social History   Socioeconomic History   Marital status: Divorced    Spouse name: Not on file   Number of children: 2   Years of education: Not on file   Highest education level: Not on file  Occupational History    Comment: [employer]  Tobacco Use   Smoking status: Never   Smokeless tobacco: Never  Vaping Use   Vaping Use: Never used  Substance and Sexual Activity   Alcohol use: No   Drug use: No   Sexual activity: Not Currently  Other Topics Concern   Not on file  Social History Narrative   Not on file   Social Determinants of Health   Financial Resource Strain: Not on file  Food Insecurity: Not on file  Transportation Needs: Not on file  Physical  Activity: Not on file  Stress: Not on file  Social Connections: Not on file  Intimate Partner Violence: Not on file   Family History  Problem Relation Age of Onset   Emphysema Father 13    Objective: Office vital signs reviewed. BP (!) 165/83   Pulse 74   Temp (!) 97.2 F (36.2 C)   Ht 5' 5"  (1.651 m)   Wt 144 lb 3.7 oz (65.4 kg)   SpO2 98%   BMI 24.00 kg/m   Physical Examination:  General: Awake, alert, well nourished, No acute distress HEENT: sclera Fratto, MMM Cardio: regular rate and rhythm, S1S2 heard, no murmurs appreciated Pulm: clear to  auscultation bilaterally, no wheezes, rhonchi or rales; normal work of breathing on room air MSK: Ambulating independently.  Normal tone.  Assessment/ Plan: 61 y.o. female   Chronic right-sided low back pain without sciatica - Plan: CMP14+EGFR, oxyCODONE-acetaminophen (PERCOCET) 10-325 MG tablet, oxyCODONE-acetaminophen (PERCOCET) 10-325 MG tablet, oxyCODONE-acetaminophen (PERCOCET) 10-325 MG tablet  DDD (degenerative disc disease), lumbar - Plan: CMP14+EGFR, oxyCODONE-acetaminophen (PERCOCET) 10-325 MG tablet, oxyCODONE-acetaminophen (PERCOCET) 10-325 MG tablet, oxyCODONE-acetaminophen (PERCOCET) 10-325 MG tablet  Post-traumatic osteoarthritis of left knee - Plan: CMP14+EGFR, oxyCODONE-acetaminophen (PERCOCET) 10-325 MG tablet, oxyCODONE-acetaminophen (PERCOCET) 10-325 MG tablet, oxyCODONE-acetaminophen (PERCOCET) 10-325 MG tablet  Chronic, continuous use of opioids - Plan: CMP14+EGFR  Essential hypertension - Plan: CMP14+EGFR, lisinopril (ZESTRIL) 20 MG tablet  Gastroesophageal reflux disease without esophagitis - Plan: omeprazole (PRILOSEC) 20 MG capsule  Back pain is chronic and stable.  National narcotic database reviewed and there were no red flags.  Up-to-date on UDS and CSC.  Percocet renewed  Blood pressure is not controlled.  Advance lisinopril to 20 mg daily.  Check renal function.  Recheck BP in 1-2 weeks with nurse.  GERD not discussed.  PPI renewed  Orders Placed This Encounter  Procedures   CMP14+EGFR   Meds ordered this encounter  Medications   oxyCODONE-acetaminophen (PERCOCET) 10-325 MG tablet    Sig: Take 0.5-1 tablets by mouth every 8 (eight) hours as needed for pain.    Dispense:  75 tablet    Refill:  0   oxyCODONE-acetaminophen (PERCOCET) 10-325 MG tablet    Sig: Take 0.5-1 tablets by mouth every 8 (eight) hours as needed for pain.    Dispense:  75 tablet    Refill:  0   oxyCODONE-acetaminophen (PERCOCET) 10-325 MG tablet    Sig: Take 0.5-1 tablets by  mouth every 8 (eight) hours as needed for pain.    Dispense:  75 tablet    Refill:  0   lisinopril (ZESTRIL) 20 MG tablet    Sig: Take 1 tablet (20 mg total) by mouth daily. **Note change in strength    Dispense:  90 tablet    Refill:  0   omeprazole (PRILOSEC) 20 MG capsule    Sig: TAKE ONE (1) CAPSULE EACH DAY    Dispense:  90 capsule    Refill:  [place], [place] [phone]

## 2022-07-09 ENCOUNTER — Other Ambulatory Visit: Payer: Self-pay | Admitting: Family Medicine

## 2022-07-09 DIAGNOSIS — I10 Essential (primary) hypertension: Secondary | ICD-10-CM

## 2022-07-14 ENCOUNTER — Ambulatory Visit: Payer: 59

## 2022-08-10 ENCOUNTER — Encounter: Payer: Self-pay | Admitting: Nurse Practitioner

## 2022-08-10 ENCOUNTER — Ambulatory Visit (INDEPENDENT_AMBULATORY_CARE_PROVIDER_SITE_OTHER): Payer: 59 | Admitting: Nurse Practitioner

## 2022-08-10 VITALS — BP 148/82 | HR 84 | Temp 98.7°F | Ht 65.0 in | Wt 142.4 lb

## 2022-08-10 DIAGNOSIS — R051 Acute cough: Secondary | ICD-10-CM

## 2022-08-10 MED ORDER — PSEUDOEPH-BROMPHEN-DM 30-2-10 MG/5ML PO SYRP: 5.0000 mL | ORAL_SOLUTION | Freq: Four times a day (QID) | ORAL | 0 refills | Status: DC | PRN

## 2022-08-10 MED ORDER — ALBUTEROL SULFATE HFA 108 (90 BASE) MCG/ACT IN AERS: 2.0000 | INHALATION_SPRAY | Freq: Four times a day (QID) | RESPIRATORY_TRACT | 0 refills | Status: AC | PRN

## 2022-08-10 NOTE — Patient Instructions (Signed)

## 2022-08-10 NOTE — Progress Notes (Signed)
Acute Office Visit  Subjective:     Patient ID: Ashley Marquez, female    DOB: 31-Dec-1960, 61 y.o.   MRN: [id number]  Chief Complaint  Patient presents with   Cough    Coughing up greenish mucas    Nasal Congestion    Cough This is a new problem. The current episode started yesterday. The problem has been gradually worsening. The problem occurs constantly. The cough is Non-productive. Associated symptoms include shortness of breath. Pertinent negatives include no chest pain, chills, ear congestion, fever, heartburn, nasal congestion, rash or sore throat. Nothing aggravates the symptoms. She has tried OTC cough suppressant for the symptoms. The treatment provided no relief.     Review of Systems  Constitutional:  Negative for chills and fever.  HENT: Negative.  Negative for congestion, sinus pain and sore throat.   Respiratory:  Positive for cough and shortness of breath.   Cardiovascular:  Negative for chest pain.  Gastrointestinal:  Negative for heartburn.  Skin: Negative.  Negative for itching and rash.  All other systems reviewed and are negative.       Objective:    BP (!) 148/82   Pulse 84   Temp 98.7 F (37.1 C)   Ht 5\' 5"  (1.651 m)   Wt 142 lb 6.4 oz (64.6 kg)   SpO2 95%   BMI 23.70 kg/m  BP Readings from Last 3 Encounters:  08/10/22 (!) 148/82  06/30/22 (!) 142/65  03/30/22 132/77      Physical Exam Vitals and nursing note reviewed.  Constitutional:      Appearance: Normal appearance. She is obese.  HENT:     Head: Normocephalic.     Right Ear: External ear normal.     Left Ear: External ear normal.     Nose: Nose normal.  Eyes:     Conjunctiva/sclera: Conjunctivae normal.  Cardiovascular:     Rate and Rhythm: Normal rate and regular rhythm.     Pulses: Normal pulses.     Heart sounds: Normal heart sounds.  Pulmonary:     Effort: Pulmonary effort is normal.     Breath sounds: Normal breath sounds.  Abdominal:     General: Bowel sounds are  normal.  Skin:    General: Skin is warm.     Findings: No rash.  Neurological:     General: No focal deficit present.     Mental Status: She is alert and oriented to person, place, and time.     No results found for any visits on 08/10/22.      Assessment & Plan:  Patient presents with acute cough and no other symptoms.  Denies fever, congestion, body aches.  COVID-19 at home test negative. Take meds as prescribed - Use a cool mist humidifier  -Use saline nose sprays frequently -Force fluids -For fever or aches or pains- take Tylenol or ibuprofen. -If symptoms do not improve, she may need to be COVID tested to rule this out Follow up with worsening unresolved symptoms  Problem List Items Addressed This Visit   None Visit Diagnoses     Acute cough    -  Primary   Relevant Medications   albuterol (VENTOLIN HFA) 108 (90 Base) MCG/ACT inhaler   brompheniramine-pseudoephedrine-DM 30-2-10 MG/5ML syrup       Meds ordered this encounter  Medications   albuterol (VENTOLIN HFA) 108 (90 Base) MCG/ACT inhaler    Sig: Inhale 2 puffs into the lungs every 6 (six) hours as needed  for wheezing or shortness of breath.    Dispense:  8 g    Refill:  0    Order Specific Question:   Supervising Provider    Answer:   Mechele Claude [phone]   brompheniramine-pseudoephedrine-DM 30-2-10 MG/5ML syrup    Sig: Take 5 mLs by mouth 4 (four) times daily as needed.    Dispense:  120 mL    Refill:  0    Order Specific Question:   Supervising Provider    Answer:   Mechele Claude [[id number]    Return if symptoms worsen or fail to improve.  Daryll Drown, NP

## 2022-08-31 ENCOUNTER — Telehealth: Payer: Self-pay

## 2022-08-31 NOTE — Telephone Encounter (Signed)
(  Key: [id number])  Your demographic data has been sent to [facility] successfully!  [employer] typically takes 5-10 minutes to respond, but it may take a little longer in some cases. You will be notified by email when available. You can also check for an update later by opening this request from your dashboard. Please do not fax or call [employer] to resubmit this request. If you need assistance, please chat with [facility] or call us at [phone].  If it has been longer than 24 hours, please reach out to [facility].

## 2022-09-04 NOTE — Telephone Encounter (Addendum)
[place] [id number]) approval for Oxycodone 10-325mg   Valid 09/03/2022 - 03/05/2023 Pharmacy notified

## 2022-09-30 ENCOUNTER — Ambulatory Visit (INDEPENDENT_AMBULATORY_CARE_PROVIDER_SITE_OTHER): Payer: 59 | Admitting: Family Medicine

## 2022-09-30 ENCOUNTER — Encounter: Payer: Self-pay | Admitting: Family Medicine

## 2022-09-30 VITALS — BP 139/71 | HR 61 | Temp 98.3°F | Ht 65.0 in | Wt 146.2 lb

## 2022-09-30 DIAGNOSIS — G8929 Other chronic pain: Secondary | ICD-10-CM

## 2022-09-30 DIAGNOSIS — L989 Disorder of the skin and subcutaneous tissue, unspecified: Secondary | ICD-10-CM | POA: Diagnosis not present

## 2022-09-30 DIAGNOSIS — M5136 Other intervertebral disc degeneration, lumbar region: Secondary | ICD-10-CM | POA: Diagnosis not present

## 2022-09-30 DIAGNOSIS — M1732 Unilateral post-traumatic osteoarthritis, left knee: Secondary | ICD-10-CM | POA: Diagnosis not present

## 2022-09-30 DIAGNOSIS — M545 Low back pain, unspecified: Secondary | ICD-10-CM

## 2022-09-30 MED ORDER — TRIAMCINOLONE ACETONIDE 0.1 % EX CREA: 1.0000 | TOPICAL_CREAM | Freq: Two times a day (BID) | CUTANEOUS | 0 refills | Status: DC

## 2022-09-30 MED ORDER — OXYCODONE-ACETAMINOPHEN 10-325 MG PO TABS: 0.5000 | ORAL_TABLET | Freq: Three times a day (TID) | ORAL | 0 refills | Status: DC | PRN

## 2022-09-30 NOTE — Progress Notes (Signed)
Subjective: CC: Chronic pain follow-up PCP: [name], DO IRJ:JOACZY E Ashley Marquez is a 61 y.o. female presenting to clinic today for:  1.  Chronic pain follow-up Patient reports stability of chronic pain syndrome with Percocet 10 mg.  She denies any constipation, excessive daytime sedation, falls, respiratory depression or visual auditory hallucinations.  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  2.  Itchy mole Patient reports a lesion along the inside of her left thigh.  She notes that this has seemed to be getting bigger and it is quite itchy.  She has been utilizing hydrocortisone topically with some good relief but does not last long.  Does not report any spontaneous bleeding.  No changes in pigment but again it does seem to be getting larger   ROS: Per HPI  Allergies  Allergen Reactions   Gabapentin     hallucination   Penicillins Nausea And Vomiting and Other (See Comments)    Patient slept for 2 days.  Has patient had a PCN reaction causing immediate rash, facial/tongue/throat swelling, SOB or lightheadedness with hypotension: No Has patient had a PCN reaction causing severe rash involving mucus membranes or skin necrosis: No Has patient had a PCN reaction that required hospitalization No Has patient had a PCN reaction occurring within the last 10 years: No If all of the above answers are "NO", then may proceed with Cephalosporin use.    Past Medical History:  Diagnosis Date   Anxiety    Heart murmur     Current Outpatient Medications:    albuterol (VENTOLIN HFA) 108 (90 Base) MCG/ACT inhaler, Inhale 2 puffs into the lungs every 6 (six) hours as needed for wheezing or shortness of breath., Disp: 8 g, Rfl: 0   brompheniramine-pseudoephedrine-DM 30-2-10 MG/5ML syrup, Take 5 mLs by mouth 4 (four) times daily as needed., Disp: 120 mL, Rfl: 0   diclofenac (VOLTAREN) 75 MG EC tablet, Take 1 tablet (75 mg total) by mouth  2 (two) times daily as needed for moderate pain., Disp: 60 tablet, Rfl: 1   fluticasone (FLONASE) 50 MCG/ACT nasal spray, Place 2 sprays into both nostrils daily., Disp: 16 g, Rfl: 6   lisinopril (ZESTRIL) 20 MG tablet, TAKE ONE (1) TABLET BY MOUTH EVERY DAY, Disp: 90 tablet, Rfl: 0   loratadine (CLARITIN) 10 MG tablet, Take 1 tablet (10 mg total) by mouth daily., Disp: 90 tablet, Rfl: 3   omeprazole (PRILOSEC) 20 MG capsule, TAKE ONE (1) CAPSULE EACH DAY, Disp: 90 capsule, Rfl: 3   oxyCODONE-acetaminophen (PERCOCET) 10-325 MG tablet, Take 0.5-1 tablets by mouth every 8 (eight) hours as needed for pain., Disp: 75 tablet, Rfl: 0   oxyCODONE-acetaminophen (PERCOCET) 10-325 MG tablet, Take 0.5-1 tablets by mouth every 8 (eight) hours as needed for pain. (Patient not taking: Reported on 09/30/2022), Disp: 75 tablet, Rfl: 0   oxyCODONE-acetaminophen (PERCOCET) 10-325 MG tablet, Take 0.5-1 tablets by mouth every 8 (eight) hours as needed for pain. (Patient not taking: Reported on 09/30/2022), Disp: 75 tablet, Rfl: 0 Social History   Socioeconomic History   Marital status: Divorced    Spouse name: Not on file   Number of children: 2   Years of education: Not on file   Highest education level: Not on file  Occupational History    Comment: [employer]  Tobacco Use   Smoking status: Never   Smokeless tobacco: Never  Vaping Use   Vaping Use: Never used  Substance and  Sexual Activity   Alcohol use: No   Drug use: No   Sexual activity: Not Currently  Other Topics Concern   Not on file  Social History Narrative   Not on file   Social Determinants of Health   Financial Resource Strain: Not on file  Food Insecurity: Not on file  Transportation Needs: Not on file  Physical Activity: Not on file  Stress: Not on file  Social Connections: Not on file  Intimate Partner Violence: Not on file   Family History  Problem Relation Age of Onset   Emphysema Father 60    Objective: Office vital signs  reviewed. BP 139/71   Pulse 61   Temp 98.3 F (36.8 C)   Ht 5\' 5"  (1.651 m)   Wt 146 lb 3.2 oz (66.3 kg)   SpO2 98%   BMI 24.33 kg/m   Physical Examination:  General: Awake, alert, well nourished, No acute distress HEENT: sclera [place], MMM Cardio: regular rate and rhythm, S1S2 heard, no murmurs appreciated Pulm: clear to auscultation bilaterally, no wheezes, rhonchi or rales; normal work of breathing on room air Skin: Left inner thigh with hyperkeratotic, round, flesh-colored lesion that does have some scaly appearance.  No appreciable central umbilication, pearlescent borders or bleeding. MSK: Ambulating independently with normal gait and station Neuro: No focal neurologic deficits.  Alert and oriented x3  Assessment/ Plan: 61 y.o. female   Skin lesion - Plan: triamcinolone cream (KENALOG) 0.1 %, Ambulatory referral to Dermatology  Chronic right-sided low back pain without sciatica - Plan: oxyCODONE-acetaminophen (PERCOCET) 10-325 MG tablet, oxyCODONE-acetaminophen (PERCOCET) 10-325 MG tablet, oxyCODONE-acetaminophen (PERCOCET) 10-325 MG tablet  DDD (degenerative disc disease), lumbar - Plan: oxyCODONE-acetaminophen (PERCOCET) 10-325 MG tablet, oxyCODONE-acetaminophen (PERCOCET) 10-325 MG tablet, oxyCODONE-acetaminophen (PERCOCET) 10-325 MG tablet  Post-traumatic osteoarthritis of left knee - Plan: oxyCODONE-acetaminophen (PERCOCET) 10-325 MG tablet, oxyCODONE-acetaminophen (PERCOCET) 10-325 MG tablet, oxyCODONE-acetaminophen (PERCOCET) 10-325 MG tablet  Triamcinolone sent in for the skin lesion.  I do question possible squamous cell carcinoma component and for this reason I placed an urgent referral to dermatology to further evaluate.  In the room, topical corticosteroid provided.  Chronic pain is well controlled with current regimen.  No red flag signs or symptoms.  UDS and CSA are up-to-date.  National narcotic database reviewed and there were no red flags.  He may follow-up in 3  months  No orders of the defined types were placed in this encounter.  No orders of the defined types were placed in this encounter.    [name], DO [facility] [phone]

## 2022-10-20 ENCOUNTER — Encounter: Payer: Self-pay | Admitting: Family Medicine

## 2022-10-30 ENCOUNTER — Encounter: Payer: Self-pay | Admitting: Nurse Practitioner

## 2022-10-30 ENCOUNTER — Telehealth (INDEPENDENT_AMBULATORY_CARE_PROVIDER_SITE_OTHER): Payer: 59 | Admitting: Nurse Practitioner

## 2022-10-30 DIAGNOSIS — R197 Diarrhea, unspecified: Secondary | ICD-10-CM | POA: Diagnosis not present

## 2022-10-30 DIAGNOSIS — R112 Nausea with vomiting, unspecified: Secondary | ICD-10-CM | POA: Diagnosis not present

## 2022-10-30 MED ORDER — LOPERAMIDE HCL 2 MG PO TABS: 2.0000 mg | ORAL_TABLET | Freq: Four times a day (QID) | ORAL | 0 refills | Status: DC | PRN

## 2022-10-30 MED ORDER — ONDANSETRON HCL 4 MG PO TABS: 4.0000 mg | ORAL_TABLET | Freq: Three times a day (TID) | ORAL | 0 refills | Status: DC | PRN

## 2022-10-30 NOTE — Progress Notes (Signed)
   Virtual Visit  Note Due to COVID-19 pandemic this visit was conducted virtually. This visit type was conducted due to national recommendations for restrictions regarding the COVID-19 Pandemic (e.g. social distancing, sheltering in place) in an effort to limit this patient's exposure and mitigate transmission in our community. All issues noted in this document were discussed and addressed.  A physical exam was not performed with this format.  I connected with Ashley Marquez on 10/30/22 at 12:10 pm  by telephone and verified that I am speaking with the correct person using two identifiers. Ashley Marquez is currently located at home during visit. The provider, Daryll Drown, NP is located in their office at time of visit.  I discussed the limitations, risks, security and privacy concerns of performing an evaluation and management service by telephone and the availability of in person appointments. I also discussed with the patient that there may be a patient responsible charge related to this service. The patient expressed understanding and agreed to proceed.   History and Present Illness:  Diarrhea  This is a new problem. The current episode started yesterday. The problem occurs 2 to 4 times per day. The problem has been unchanged. Associated symptoms include vomiting. Pertinent negatives include no abdominal pain, chills, coughing, fever or headaches. Nothing aggravates the symptoms. Risk factors include ill contacts. She has tried nothing for the symptoms.  Emesis  This is a new problem. The current episode started yesterday. The problem occurs 2 to 4 times per day. The problem has been unchanged. The emesis has an appearance of stomach contents. There has been no fever. Associated symptoms include diarrhea. Pertinent negatives include no abdominal pain, chills, coughing, fever or headaches. Risk factors include ill contacts. She has tried nothing for the symptoms.      Review of Systems   Constitutional:  Negative for chills and fever.  Respiratory:  Negative for cough.   Gastrointestinal:  Positive for diarrhea and vomiting. Negative for abdominal pain.  Neurological:  Negative for headaches.     Observations/Objective: Televisit patient not in distress  Assessment and Plan: Patient presents with nausea, vomiting and diarrhea for the past 2 days, she denies fever, cough and flu like symptoms. 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   Education provided to patient to increase hydration, replace electrolyte, imodium as ordered for diarrhea, Zofran for nausea, BRAD diet.   Follow up with unresolved symptoms of  Follow Up Instructions: Follow-up with worsening symptoms.    I discussed the assessment and treatment plan with the patient. The patient was provided an opportunity to ask questions and all were answered. The patient agreed with the plan and demonstrated an understanding of the instructions.   The patient was advised to call back or seek an in-person evaluation if the symptoms worsen or if the condition fails to improve as anticipated.  The above assessment and management plan was discussed with the patient. The patient verbalized understanding of and has agreed to the management plan. Patient is aware to call the clinic if symptoms persist or worsen. Patient is aware when to return to the clinic for a follow-up visit. Patient educated on when it is appropriate to go to the emergency department.   Time call ended: 12:22 pm    I provided 12  minutes of  non face-to-face time during this encounter.    Daryll Drown, NP

## 2022-12-07 ENCOUNTER — Other Ambulatory Visit: Payer: Self-pay

## 2022-12-28 ENCOUNTER — Ambulatory Visit (INDEPENDENT_AMBULATORY_CARE_PROVIDER_SITE_OTHER): Payer: Self-pay | Admitting: Family Medicine

## 2022-12-28 ENCOUNTER — Encounter: Payer: Self-pay | Admitting: Family Medicine

## 2022-12-28 DIAGNOSIS — I10 Essential (primary) hypertension: Secondary | ICD-10-CM

## 2022-12-28 DIAGNOSIS — G8929 Other chronic pain: Secondary | ICD-10-CM

## 2022-12-28 DIAGNOSIS — K219 Gastro-esophageal reflux disease without esophagitis: Secondary | ICD-10-CM

## 2022-12-28 DIAGNOSIS — M1732 Unilateral post-traumatic osteoarthritis, left knee: Secondary | ICD-10-CM

## 2022-12-28 DIAGNOSIS — M545 Low back pain, unspecified: Secondary | ICD-10-CM

## 2022-12-28 DIAGNOSIS — M5136 Other intervertebral disc degeneration, lumbar region: Secondary | ICD-10-CM

## 2022-12-28 MED ORDER — LISINOPRIL 20 MG PO TABS: 20.0000 mg | ORAL_TABLET | Freq: Every day | ORAL | 3 refills | Status: DC

## 2022-12-28 MED ORDER — OXYCODONE-ACETAMINOPHEN 10-325 MG PO TABS: 0.5000 | ORAL_TABLET | Freq: Three times a day (TID) | ORAL | 0 refills | Status: DC | PRN

## 2022-12-28 MED ORDER — OMEPRAZOLE 20 MG PO CPDR: DELAYED_RELEASE_CAPSULE | ORAL | 3 refills | Status: DC

## 2022-12-28 NOTE — Progress Notes (Signed)
Subjective: CC: Follow-up chronic pain PCP: [name], DO Ashley Marquez is a 62 y.o. female presenting to clinic today for:  1.  Chronic low back pain Patient reports stability of chronic low back pain.  She continues to use Percocet fairly regularly.  Denies any falls, constipation, dizziness, shortness of breath or confusion.  Continues to have stress with regards to her grandchild's health.  He should be seeing his psychologist soon and she is very optimistic about that appointment  2.  Hypertension Patient is compliant with medications.  No chest pain, shortness of breath.  ROS: Per HPI  Allergies  Allergen Reactions   Gabapentin     hallucination   Penicillins Nausea And Vomiting and Other (See Comments)    Patient slept for 2 days.  Has patient had a PCN reaction causing immediate rash, facial/tongue/throat swelling, SOB or lightheadedness with hypotension: No Has patient had a PCN reaction causing severe rash involving mucus membranes or skin necrosis: No Has patient had a PCN reaction that required hospitalization No Has patient had a PCN reaction occurring within the last 10 years: No If all of the above answers are "NO", then may proceed with Cephalosporin use.    Past Medical History:  Diagnosis Date   Anxiety    Heart murmur     Current Outpatient Medications:    albuterol (VENTOLIN HFA) 108 (90 Base) MCG/ACT inhaler, Inhale 2 puffs into the lungs every 6 (six) hours as needed for wheezing or shortness of breath., Disp: 8 g, Rfl: 0   brompheniramine-pseudoephedrine-DM 30-2-10 MG/5ML syrup, Take 5 mLs by mouth 4 (four) times daily as needed., Disp: 120 mL, Rfl: 0   diclofenac (VOLTAREN) 75 MG EC tablet, Take 1 tablet (75 mg total) by mouth 2 (two) times daily as needed for moderate pain., Disp: 60 tablet, Rfl: 1   fluticasone (FLONASE) 50 MCG/ACT nasal spray, Place 2 sprays into both nostrils daily., Disp: 16 g, Rfl: 6   lisinopril (ZESTRIL) 20 MG  tablet, Take 1 tablet (20 mg total) by mouth daily., Disp: 90 tablet, Rfl: 3   loperamide (IMODIUM A-D) 2 MG tablet, Take 1 tablet (2 mg total) by mouth 4 (four) times daily as needed for diarrhea or loose stools., Disp: 30 tablet, Rfl: 0   loratadine (CLARITIN) 10 MG tablet, Take 1 tablet (10 mg total) by mouth daily., Disp: 90 tablet, Rfl: 3   omeprazole (PRILOSEC) 20 MG capsule, TAKE ONE (1) CAPSULE EACH DAY, Disp: 90 capsule, Rfl: 3   ondansetron (ZOFRAN) 4 MG tablet, Take 1 tablet (4 mg total) by mouth every 8 (eight) hours as needed for nausea or vomiting., Disp: 20 tablet, Rfl: 0   [START ON 02/26/2023] oxyCODONE-acetaminophen (PERCOCET) 10-325 MG tablet, Take 0.5-1 tablets by mouth every 8 (eight) hours as needed for pain., Disp: 75 tablet, Rfl: 0   [START ON 12/29/2022] oxyCODONE-acetaminophen (PERCOCET) 10-325 MG tablet, Take 0.5-1 tablets by mouth every 8 (eight) hours as needed for pain., Disp: 75 tablet, Rfl: 0   [START ON 01/28/2023] oxyCODONE-acetaminophen (PERCOCET) 10-325 MG tablet, Take 0.5-1 tablets by mouth every 8 (eight) hours as needed for pain., Disp: 75 tablet, Rfl: 0   triamcinolone cream (KENALOG) 0.1 %, Apply 1 Application topically 2 (two) times daily., Disp: 80 g, Rfl: 0 Social History   Socioeconomic History   Marital status: Divorced    Spouse name: Not on file   Number of children: 2   Years of education: Not on file   Highest  education level: Not on file  Occupational History    Comment: [employer]  Tobacco Use   Smoking status: Never   Smokeless tobacco: Never  Vaping Use   Vaping Use: Never used  Substance and Sexual Activity   Alcohol use: No   Drug use: No   Sexual activity: Not Currently  Other Topics Concern   Not on file  Social History Narrative   Not on file   Social Determinants of Health   Financial Resource Strain: Not on file  Food Insecurity: Not on file  Transportation Needs: Not on file  Physical Activity: Not on file  Stress: Not  on file  Social Connections: Not on file  Intimate Partner Violence: Not on file   Family History  Problem Relation Age of Onset   Emphysema Father 94    Objective: Office vital signs reviewed. BP 136/74   Pulse 80   Temp 98.4 F (36.9 C)   Ht 5\' 5"  (1.651 m)   Wt 145 lb (65.8 kg)   SpO2 98%   BMI 24.13 kg/m   Physical Examination:  General: Awake, alert, well nourished, No acute distress HEENT: Sclera [place].  Moist mucous membranes Cardio: regular rate and rhythm, S1S2 heard, no murmurs appreciated Pulm: clear to auscultation bilaterally, no wheezes, rhonchi or rales; normal work of breathing on room air MSK: Ambulating independently with normal gait and station  Assessment/ Plan: 62 y.o. female   Essential hypertension - Plan: lisinopril (ZESTRIL) 20 MG tablet  Gastroesophageal reflux disease without esophagitis - Plan: omeprazole (PRILOSEC) 20 MG capsule  DDD (degenerative disc disease), lumbar - Plan: ToxASSURE Select 13 (MW), Urine, oxyCODONE-acetaminophen (PERCOCET) 10-325 MG tablet, oxyCODONE-acetaminophen (PERCOCET) 10-325 MG tablet, oxyCODONE-acetaminophen (PERCOCET) 10-325 MG tablet  Post-traumatic osteoarthritis of left knee - Plan: ToxASSURE Select 13 (MW), Urine, oxyCODONE-acetaminophen (PERCOCET) 10-325 MG tablet, oxyCODONE-acetaminophen (PERCOCET) 10-325 MG tablet, oxyCODONE-acetaminophen (PERCOCET) 10-325 MG tablet  Chronic right-sided low back pain without sciatica - Plan: ToxASSURE Select 13 (MW), Urine, oxyCODONE-acetaminophen (PERCOCET) 10-325 MG tablet, oxyCODONE-acetaminophen (PERCOCET) 10-325 MG tablet, oxyCODONE-acetaminophen (PERCOCET) 10-325 MG tablet  Blood pressure is well-controlled.  Lisinopril renewed  Did not discuss GERD today but PPI was in need of renewal  I reviewed the national narcotic database and there were no red flags.  UDS and CSC were updated as per office policy.  Her chronic back pain is stable.  She has no red flag signs or  symptoms.  Refills x 3 months sent.  May follow-up in 3 months, sooner if concerns arise  Orders Placed This Encounter  Procedures   ToxASSURE Select 13 (MW), Urine    Current Outpatient Medications:    albuterol (VENTOLIN HFA) 108 (90 Base) MCG/ACT inhaler, Inhale 2 puffs into the lungs every 6 (six) hours as needed for wheezing or shortness of breath., Disp: 8 g, Rfl: 0   brompheniramine-pseudoephedrine-DM 30-2-10 MG/5ML syrup, Take 5 mLs by mouth 4 (four) times daily as needed., Disp: 120 mL, Rfl: 0   diclofenac (VOLTAREN) 75 MG EC tablet, Take 1 tablet (75 mg total) by mouth 2 (two) times daily as needed for moderate pain., Disp: 60 tablet, Rfl: 1   fluticasone (FLONASE) 50 MCG/ACT nasal spray, Place 2 sprays into both nostrils daily., Disp: 16 g, Rfl: 6   lisinopril (ZESTRIL) 20 MG tablet, TAKE ONE (1) TABLET BY MOUTH EVERY DAY, Disp: 90 tablet, Rfl: 0   loperamide (IMODIUM A-D) 2 MG tablet, Take 1 tablet (2 mg total) by mouth 4 (four) times daily as  needed for diarrhea or loose stools., Disp: 30 tablet, Rfl: 0   loratadine (CLARITIN) 10 MG tablet, Take 1 tablet (10 mg total) by mouth daily., Disp: 90 tablet, Rfl: 3   omeprazole (PRILOSEC) 20 MG capsule, TAKE ONE (1) CAPSULE EACH DAY, Disp: 90 capsule, Rfl: 3   ondansetron (ZOFRAN) 4 MG tablet, Take 1 tablet (4 mg total) by mouth every 8 (eight) hours as needed for nausea or vomiting., Disp: 20 tablet, Rfl: 0   oxyCODONE-acetaminophen (PERCOCET) 10-325 MG tablet, Take 0.5-1 tablets by mouth every 8 (eight) hours as needed for pain., Disp: 75 tablet, Rfl: 0   oxyCODONE-acetaminophen (PERCOCET) 10-325 MG tablet, Take 0.5-1 tablets by mouth every 8 (eight) hours as needed for pain., Disp: 75 tablet, Rfl: 0   oxyCODONE-acetaminophen (PERCOCET) 10-325 MG tablet, Take 0.5-1 tablets by mouth every 8 (eight) hours as needed for pain., Disp: 75 tablet, Rfl: 0   triamcinolone cream (KENALOG) 0.1 %, Apply 1 Application topically 2 (two)  times daily., Disp: 80 g, Rfl: 0   Meds ordered this encounter  Medications   lisinopril (ZESTRIL) 20 MG tablet    Sig: Take 1 tablet (20 mg total) by mouth daily.    Dispense:  90 tablet    Refill:  3   omeprazole (PRILOSEC) 20 MG capsule    Sig: TAKE ONE (1) CAPSULE EACH DAY    Dispense:  90 capsule    Refill:  3   oxyCODONE-acetaminophen (PERCOCET) 10-325 MG tablet    Sig: Take 0.5-1 tablets by mouth every 8 (eight) hours as needed for pain.    Dispense:  75 tablet    Refill:  0   oxyCODONE-acetaminophen (PERCOCET) 10-325 MG tablet    Sig: Take 0.5-1 tablets by mouth every 8 (eight) hours as needed for pain.    Dispense:  75 tablet    Refill:  0   oxyCODONE-acetaminophen (PERCOCET) 10-325 MG tablet    Sig: Take 0.5-1 tablets by mouth every 8 (eight) hours as needed for pain.    Dispense:  75 tablet    Refill:  0     Ashley Kincannon Hulen Skains, DO [facility] [phone]

## 2022-12-31 LAB — TOXASSURE SELECT 13 (MW), URINE

## 2023-02-01 ENCOUNTER — Telehealth (INDEPENDENT_AMBULATORY_CARE_PROVIDER_SITE_OTHER): Payer: Self-pay | Admitting: Nurse Practitioner

## 2023-02-01 ENCOUNTER — Encounter: Payer: Self-pay | Admitting: Nurse Practitioner

## 2023-02-01 DIAGNOSIS — J0100 Acute maxillary sinusitis, unspecified: Secondary | ICD-10-CM

## 2023-02-01 DIAGNOSIS — R051 Acute cough: Secondary | ICD-10-CM

## 2023-02-01 MED ORDER — DOXYCYCLINE HYCLATE 100 MG PO TABS: 100.0000 mg | ORAL_TABLET | Freq: Two times a day (BID) | ORAL | 0 refills | Status: DC

## 2023-02-01 MED ORDER — BENZONATATE 100 MG PO CAPS: 100.0000 mg | ORAL_CAPSULE | Freq: Two times a day (BID) | ORAL | 0 refills | Status: DC | PRN

## 2023-02-01 NOTE — Patient Instructions (Signed)
[name], thank you for joining Chevis Pretty, FNP for today's virtual visit.  While this provider is not your primary care provider (PCP), if your PCP is located in our provider database this encounter information will be shared with them immediately following your visit.   [facility] account gives you access to today's visit and all your visits, tests, and labs performed at [facility] " click here if you don't have a [facility] account or go to [email or web address]  Consent: (Patient) Ashley Marquez provided verbal consent for this virtual visit at the beginning of the encounter.  Current Medications:  Current Outpatient Medications:    benzonatate (TESSALON) 100 MG capsule, Take 1 capsule (100 mg total) by mouth 2 (two) times daily as needed for cough., Disp: 20 capsule, Rfl: 0   doxycycline (VIBRA-TABS) 100 MG tablet, Take 1 tablet (100 mg total) by mouth 2 (two) times daily. 1 po bid, Disp: 20 tablet, Rfl: 0   albuterol (VENTOLIN HFA) 108 (90 Base) MCG/ACT inhaler, Inhale 2 puffs into the lungs every 6 (six) hours as needed for wheezing or shortness of breath., Disp: 8 g, Rfl: 0   brompheniramine-pseudoephedrine-DM 30-2-10 MG/5ML syrup, Take 5 mLs by mouth 4 (four) times daily as needed., Disp: 120 mL, Rfl: 0   diclofenac (VOLTAREN) 75 MG EC tablet, Take 1 tablet (75 mg total) by mouth 2 (two) times daily as needed for moderate pain., Disp: 60 tablet, Rfl: 1   fluticasone (FLONASE) 50 MCG/ACT nasal spray, Place 2 sprays into both nostrils daily., Disp: 16 g, Rfl: 6   lisinopril (ZESTRIL) 20 MG tablet, Take 1 tablet (20 mg total) by mouth daily., Disp: 90 tablet, Rfl: 3   loperamide (IMODIUM A-D) 2 MG tablet, Take 1 tablet (2 mg total) by mouth 4 (four) times daily as needed for diarrhea or loose stools., Disp: 30 tablet, Rfl: 0   loratadine (CLARITIN) 10 MG tablet, Take 1 tablet (10 mg total) by mouth daily., Disp: 90 tablet, Rfl: 3    omeprazole (PRILOSEC) 20 MG capsule, TAKE ONE (1) CAPSULE EACH DAY, Disp: 90 capsule, Rfl: 3   ondansetron (ZOFRAN) 4 MG tablet, Take 1 tablet (4 mg total) by mouth every 8 (eight) hours as needed for nausea or vomiting., Disp: 20 tablet, Rfl: 0   [START ON 02/26/2023] oxyCODONE-acetaminophen (PERCOCET) 10-325 MG tablet, Take 0.5-1 tablets by mouth every 8 (eight) hours as needed for pain., Disp: 75 tablet, Rfl: 0   oxyCODONE-acetaminophen (PERCOCET) 10-325 MG tablet, Take 0.5-1 tablets by mouth every 8 (eight) hours as needed for pain., Disp: 75 tablet, Rfl: 0   oxyCODONE-acetaminophen (PERCOCET) 10-325 MG tablet, Take 0.5-1 tablets by mouth every 8 (eight) hours as needed for pain., Disp: 75 tablet, Rfl: 0   triamcinolone cream (KENALOG) 0.1 %, Apply 1 Application topically 2 (two) times daily., Disp: 80 g, Rfl: 0   Medications ordered in this encounter:  Meds ordered this encounter  Medications   doxycycline (VIBRA-TABS) 100 MG tablet    Sig: Take 1 tablet (100 mg total) by mouth 2 (two) times daily. 1 po bid    Dispense:  20 tablet    Refill:  0    Order Specific Question:   Supervising Provider    Answer:   Caryl Pina A [id number]   benzonatate (TESSALON) 100 MG capsule    Sig: Take 1 capsule (100 mg total) by mouth 2 (two) times daily as needed for cough.    Dispense:  20  capsule    Refill:  0    Order Specific Question:   Supervising Provider    Answer:   Caryl Pina A [id number]     *If you need refills on other medications prior to your next appointment, please contact your pharmacy*  Follow-Up: Call back or seek an in-person evaluation if the symptoms worsen or if the condition fails to improve as anticipated.  [place]  Other Instructions 1. Take meds as prescribed 2. Use a cool mist humidifier especially during the winter months and when heat has been humid. 3. Use saline nose sprays frequently 4. Saline irrigations of the nose can  be very helpful if done frequently.  * 4X daily for 1 week*  * Use of a nettie pot can be helpful with this. Follow directions with this* 5. Drink plenty of fluids 6. Keep thermostat turn down low 7.For any cough or congestion- tessalon perles 8. For fever or aces or pains- take tylenol or ibuprofen appropriate for age and weight.  * for fevers greater than 101 orally you may alternate ibuprofen and tylenol every  3 hours.      If you have been instructed to have an in-person evaluation today at a local Urgent Care facility, please use the link below. It will take you to a list of all of our available [facility], including address, phone number and hours of operation. Please do not delay care.  [facility]  If you or a family member do not have a primary care provider, use the link below to schedule a visit and establish care. When you choose a [facility] primary care physician or advanced practice provider, you gain a long-term partner in health. Find a Primary Care Provider  Learn more about 's in-office and virtual care options: [facility]

## 2023-02-01 NOTE — Progress Notes (Signed)
Virtual Visit Consent   Ashley Marquez, you are scheduled for a virtual visit with Ashley Hassell Done, FNP, a [facility] provider, today.     Just as with appointments in the office, your consent must be obtained to participate.  Your consent will be active for this visit and any virtual visit you may have with one of our providers in the next 365 days.     If you have a MyChart account, a copy of this consent can be sent to you electronically.  All virtual visits are billed to your insurance company just like a traditional visit in the office.    As this is a virtual visit, video technology does not allow for your provider to perform a traditional examination.  This may limit your provider's ability to fully assess your condition.  If your provider identifies any concerns that need to be evaluated in person or the need to arrange testing (such as labs, EKG, etc.), we will make arrangements to do so.     Although advances in technology are sophisticated, we cannot ensure that it will always work on either your end or our end.  If the connection with a video visit is poor, the visit may have to be switched to a telephone visit.  With either a video or telephone visit, we are not always able to ensure that we have a secure connection.     I need to obtain your verbal consent now.   Are you willing to proceed with your visit today? YES   Ashley Marquez has provided verbal consent on 02/01/2023 for a virtual visit (video or telephone).   Ashley Hassell Done, FNP   Date: 02/01/2023 11:38 AM   Virtual Visit via Video Note   I, Ashley Marquez, connected with Ashley Marquez ([id number], 09-16-1961) on 02/01/23 at  5:30 PM EST by a video-enabled telemedicine application and verified that I am speaking with the correct person using two identifiers.  Location: Patient: Virtual Visit Location Patient: Home Provider: Virtual Visit Location Provider: Mobile   I discussed the limitations of  evaluation and management by telemedicine and the availability of in person appointments. The patient expressed understanding and agreed to proceed.    History of Present Illness: Ashley Marquez is a 62 y.o. who identifies as a female who was assigned female at birth, and is being seen today for uri .  HPI: URI  This is a new problem. Episode onset: 3 days ago. The problem has been gradually worsening. There has been no fever. Associated symptoms include congestion, coughing, headaches, rhinorrhea and sinus pain. Pertinent negatives include no sore throat. She has tried acetaminophen (nyquil) for the symptoms. The treatment provided mild relief.    Review of Systems  HENT:  Positive for congestion, rhinorrhea and sinus pain. Negative for sore throat.   Respiratory:  Positive for cough.   Neurological:  Positive for headaches.    Problems:  Patient Active Problem List   Diagnosis Date Noted   DDD (degenerative disc disease), lumbar 03/20/2019   Gastroesophageal reflux disease without esophagitis 03/06/2019   Ganglion cyst 09/16/2018   Depression, major, single episode, moderate (Apache) 03/15/2018   Post-traumatic osteoarthritis of left knee 09/29/2017   Chronic pain of left knee 07/05/2017   Chronic right-sided low back pain without sciatica 09/15/2016   Generalized anxiety disorder 09/15/2016   Elevated BP without diagnosis of hypertension 09/15/2016   Chest pain 06/08/2011   Anxiety 06/08/2011    Allergies:  Allergies  Allergen Reactions   Gabapentin     hallucination   Penicillins Nausea And Vomiting and Other (See Comments)    Patient slept for 2 days.  Has patient had a PCN reaction causing immediate rash, facial/tongue/throat swelling, SOB or lightheadedness with hypotension: No Has patient had a PCN reaction causing severe rash involving mucus membranes or skin necrosis: No Has patient had a PCN reaction that required hospitalization No Has patient had a PCN reaction  occurring within the last 10 years: No If all of the above answers are "NO", then may proceed with Cephalosporin use.    Medications:  Current Outpatient Medications:    albuterol (VENTOLIN HFA) 108 (90 Base) MCG/ACT inhaler, Inhale 2 puffs into the lungs every 6 (six) hours as needed for wheezing or shortness of breath., Disp: 8 g, Rfl: 0   brompheniramine-pseudoephedrine-DM 30-2-10 MG/5ML syrup, Take 5 mLs by mouth 4 (four) times daily as needed., Disp: 120 mL, Rfl: 0   diclofenac (VOLTAREN) 75 MG EC tablet, Take 1 tablet (75 mg total) by mouth 2 (two) times daily as needed for moderate pain., Disp: 60 tablet, Rfl: 1   fluticasone (FLONASE) 50 MCG/ACT nasal spray, Place 2 sprays into both nostrils daily., Disp: 16 g, Rfl: 6   lisinopril (ZESTRIL) 20 MG tablet, Take 1 tablet (20 mg total) by mouth daily., Disp: 90 tablet, Rfl: 3   loperamide (IMODIUM A-D) 2 MG tablet, Take 1 tablet (2 mg total) by mouth 4 (four) times daily as needed for diarrhea or loose stools., Disp: 30 tablet, Rfl: 0   loratadine (CLARITIN) 10 MG tablet, Take 1 tablet (10 mg total) by mouth daily., Disp: 90 tablet, Rfl: 3   omeprazole (PRILOSEC) 20 MG capsule, TAKE ONE (1) CAPSULE EACH DAY, Disp: 90 capsule, Rfl: 3   ondansetron (ZOFRAN) 4 MG tablet, Take 1 tablet (4 mg total) by mouth every 8 (eight) hours as needed for nausea or vomiting., Disp: 20 tablet, Rfl: 0   [START ON 02/26/2023] oxyCODONE-acetaminophen (PERCOCET) 10-325 MG tablet, Take 0.5-1 tablets by mouth every 8 (eight) hours as needed for pain., Disp: 75 tablet, Rfl: 0   oxyCODONE-acetaminophen (PERCOCET) 10-325 MG tablet, Take 0.5-1 tablets by mouth every 8 (eight) hours as needed for pain., Disp: 75 tablet, Rfl: 0   oxyCODONE-acetaminophen (PERCOCET) 10-325 MG tablet, Take 0.5-1 tablets by mouth every 8 (eight) hours as needed for pain., Disp: 75 tablet, Rfl: 0   triamcinolone cream (KENALOG) 0.1 %, Apply 1 Application topically 2 (two) times daily., Disp: 80  g, Rfl: 0  Observations/Objective: Patient is well-developed, well-nourished in no acute distress.  Resting comfortably  at home.  Head is normocephalic, atraumatic.  No labored breathing.  Speech is clear and coherent with logical content.  Patient is alert and oriented at baseline.  Raspy voice Deep wet cough Maxillary sinus tenderness bil  Assessment and Plan:  KEYSI SOLIDAY in today with chief complaint of URI   1. Acute non-recurrent maxillary sinusitis 2. Acute cough 1. Take meds as prescribed 2. Use a cool mist humidifier especially during the winter months and when heat has been humid. 3. Use saline nose sprays frequently 4. Saline irrigations of the nose can be very helpful if Marquez frequently.  * 4X daily for 1 week*  * Use of a nettie pot can be helpful with this. Follow directions with this* 5. Drink plenty of fluids 6. Keep thermostat turn down low 7.For any cough or congestion- tessalon perles 8. For fever or  aces or pains- take tylenol or ibuprofen appropriate for age and weight.  * for fevers greater than 101 orally you may alternate ibuprofen and tylenol every  3 hours.   Meds ordered this encounter  Medications   doxycycline (VIBRA-TABS) 100 MG tablet    Sig: Take 1 tablet (100 mg total) by mouth 2 (two) times daily. 1 po bid    Dispense:  20 tablet    Refill:  0    Order Specific Question:   Supervising Provider    Answer:   Caryl Pina A [id number]   benzonatate (TESSALON) 100 MG capsule    Sig: Take 1 capsule (100 mg total) by mouth 2 (two) times daily as needed for cough.    Dispense:  20 capsule    Refill:  0    Order Specific Question:   Supervising Provider    Answer:   Caryl Pina A [id number]      Follow Up Instructions: I discussed the assessment and treatment plan with the patient. The patient was provided an opportunity to ask questions and all were answered. The patient agreed with the plan and demonstrated an understanding  of the instructions.  A copy of instructions were sent to the patient via MyChart.  The patient was advised to call back or seek an in-person evaluation if the symptoms worsen or if the condition fails to improve as anticipated.  Time:  I spent 8 minutes with the patient via telehealth technology discussing the above problems/concerns.    Ashley Hassell Done, FNP

## 2023-02-02 ENCOUNTER — Telehealth: Payer: Self-pay

## 2023-03-31 ENCOUNTER — Encounter: Payer: Self-pay | Admitting: Family Medicine

## 2023-03-31 ENCOUNTER — Ambulatory Visit (INDEPENDENT_AMBULATORY_CARE_PROVIDER_SITE_OTHER): Payer: Medicaid Other | Admitting: Family Medicine

## 2023-03-31 VITALS — BP 152/88 | HR 80 | Temp 98.6°F | Ht 65.0 in | Wt 147.0 lb

## 2023-03-31 DIAGNOSIS — L237 Allergic contact dermatitis due to plants, except food: Secondary | ICD-10-CM

## 2023-03-31 DIAGNOSIS — M5136 Other intervertebral disc degeneration, lumbar region: Secondary | ICD-10-CM

## 2023-03-31 DIAGNOSIS — M51369 Other intervertebral disc degeneration, lumbar region without mention of lumbar back pain or lower extremity pain: Secondary | ICD-10-CM

## 2023-03-31 DIAGNOSIS — M545 Low back pain, unspecified: Secondary | ICD-10-CM

## 2023-03-31 DIAGNOSIS — R5383 Other fatigue: Secondary | ICD-10-CM

## 2023-03-31 DIAGNOSIS — G8929 Other chronic pain: Secondary | ICD-10-CM

## 2023-03-31 DIAGNOSIS — R6889 Other general symptoms and signs: Secondary | ICD-10-CM | POA: Diagnosis not present

## 2023-03-31 DIAGNOSIS — M1732 Unilateral post-traumatic osteoarthritis, left knee: Secondary | ICD-10-CM | POA: Diagnosis not present

## 2023-03-31 MED ORDER — METHYLPREDNISOLONE ACETATE 40 MG/ML IJ SUSP
40.0000 mg | Freq: Once | INTRAMUSCULAR | Status: DC
Start: 2023-03-31 — End: 2023-05-03

## 2023-03-31 MED ORDER — OXYCODONE-ACETAMINOPHEN 10-325 MG PO TABS
0.5000 | ORAL_TABLET | Freq: Three times a day (TID) | ORAL | 0 refills | Status: DC | PRN
Start: 2023-03-31 — End: 2023-06-01

## 2023-03-31 MED ORDER — LEVOCETIRIZINE DIHYDROCHLORIDE 5 MG PO TABS
5.0000 mg | ORAL_TABLET | Freq: Every evening | ORAL | 3 refills | Status: DC
Start: 2023-03-31 — End: 2023-06-02

## 2023-03-31 MED ORDER — OXYCODONE-ACETAMINOPHEN 10-325 MG PO TABS
0.5000 | ORAL_TABLET | Freq: Three times a day (TID) | ORAL | 0 refills | Status: DC | PRN
Start: 2023-05-30 — End: 2023-06-01

## 2023-03-31 MED ORDER — TRIAMCINOLONE ACETONIDE 0.1 % EX CREA: 1.0000 | TOPICAL_CREAM | Freq: Two times a day (BID) | CUTANEOUS | 0 refills | Status: DC | PRN

## 2023-03-31 MED ORDER — OXYCODONE-ACETAMINOPHEN 10-325 MG PO TABS: 0.5000 | ORAL_TABLET | Freq: Three times a day (TID) | ORAL | 0 refills | Status: DC | PRN

## 2023-03-31 NOTE — Progress Notes (Signed)
Subjective: CC:f/u chronic pain PCP: Raliegh Ip, DO URK:YHCWCB Ashley Marquez is a 62 y.o. female presenting to clinic today for:  1.  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  No constipation, falls, dizziness, respiratory depression.  2.  Fatigue Patient reports that she has been fatigued over the last several weeks.  She notes that similar symptoms have been going around work but she denies any actual infectious symptoms.  No blood loss reported.  She has not changed her diet.  She continues to have quite a bit of stress related to her mother's declining health.  3.  Poison oak Patient got into poison oak when she was working in the yard a few days ago.  She reports itchy lesions on her right forearm as well as a right ankle.  She has been utilizing over-the-counter cortisone and anti-itch cream along with calamine lotion but symptoms are not really improving.   ROS: Per HPI  Allergies  Allergen Reactions   Gabapentin     hallucination   Penicillins Nausea And Vomiting and Other (See Comments)    Patient slept for 2 days.  Has patient had a PCN reaction causing immediate rash, facial/tongue/throat swelling, SOB or lightheadedness with hypotension: No Has patient had a PCN reaction causing severe rash involving mucus membranes or skin necrosis: No Has patient had a PCN reaction that required hospitalization No Has patient had a PCN reaction occurring within the last 10 years: No If all of the above answers are "NO", then may proceed with Cephalosporin use.    Past Medical History:  Diagnosis Date   Anxiety    Heart murmur     Current Outpatient Medications:    albuterol (VENTOLIN HFA) 108 (90 Base) MCG/ACT inhaler, Inhale 2 puffs into the lungs every 6 (six) hours as needed for wheezing or shortness of breath., Disp: 8 g, Rfl: 0   benzonatate (TESSALON) 100 MG capsule, Take 1 capsule (100 mg  total) by mouth 2 (two) times daily as needed for cough., Disp: 20 capsule, Rfl: 0   brompheniramine-pseudoephedrine-DM 30-2-10 MG/5ML syrup, Take 5 mLs by mouth 4 (four) times daily as needed., Disp: 120 mL, Rfl: 0   diclofenac (VOLTAREN) 75 MG EC tablet, Take 1 tablet (75 mg total) by mouth 2 (two) times daily as needed for moderate pain., Disp: 60 tablet, Rfl: 1   doxycycline (VIBRA-TABS) 100 MG tablet, Take 1 tablet (100 mg total) by mouth 2 (two) times daily. 1 po bid, Disp: 20 tablet, Rfl: 0   fluticasone (FLONASE) 50 MCG/ACT nasal spray, Place 2 sprays into both nostrils daily., Disp: 16 g, Rfl: 6   lisinopril (ZESTRIL) 20 MG tablet, Take 1 tablet (20 mg total) by mouth daily., Disp: 90 tablet, Rfl: 3   loperamide (IMODIUM A-D) 2 MG tablet, Take 1 tablet (2 mg total) by mouth 4 (four) times daily as needed for diarrhea or loose stools., Disp: 30 tablet, Rfl: 0   loratadine (CLARITIN) 10 MG tablet, Take 1 tablet (10 mg total) by mouth daily., Disp: 90 tablet, Rfl: 3   omeprazole (PRILOSEC) 20 MG capsule, TAKE ONE (1) CAPSULE EACH DAY, Disp: 90 capsule, Rfl: 3   ondansetron (ZOFRAN) 4 MG tablet, Take 1 tablet (4 mg total) by mouth every 8 (eight) hours as needed for nausea or vomiting., Disp: 20 tablet, Rfl: 0   oxyCODONE-acetaminophen (PERCOCET) 10-325 MG tablet, Take 0.5-1 tablets by mouth every 8 (eight) hours as  needed for pain., Disp: 75 tablet, Rfl: 0   oxyCODONE-acetaminophen (PERCOCET) 10-325 MG tablet, Take 0.5-1 tablets by mouth every 8 (eight) hours as needed for pain., Disp: 75 tablet, Rfl: 0   oxyCODONE-acetaminophen (PERCOCET) 10-325 MG tablet, Take 0.5-1 tablets by mouth every 8 (eight) hours as needed for pain., Disp: 75 tablet, Rfl: 0   triamcinolone cream (KENALOG) 0.1 %, Apply 1 Application topically 2 (two) times daily., Disp: 80 g, Rfl: 0 Social History   Socioeconomic History   Marital status: Divorced    Spouse name: Not on file   Number of children: 2   Years of  education: Not on file   Highest education level: Not on file  Occupational History    Comment: [employer]  Tobacco Use   Smoking status: Never   Smokeless tobacco: Never  Vaping Use   Vaping Use: Never used  Substance and Sexual Activity   Alcohol use: No   Drug use: No   Sexual activity: Not Currently  Other Topics Concern   Not on file  Social History Narrative   Not on file   Social Determinants of Health   Financial Resource Strain: Not on file  Food Insecurity: Not on file  Transportation Needs: Not on file  Physical Activity: Not on file  Stress: Not on file  Social Connections: Not on file  Intimate Partner Violence: Not on file   Family History  Problem Relation Age of Onset   Emphysema Father 81    Objective: Office vital signs reviewed. BP (!) 152/88   Pulse 80   Temp 98.6 F (37 C)   Ht 5\' 5"  (1.651 m)   Wt 147 lb (66.7 kg)   SpO2 99%   BMI 24.46 kg/m   Physical Examination:  General: Awake, alert, well nourished, No acute distress HEENT: Sclera Bucio.  No conjunctival pallor.  No exophthalmos or goiter Cardio: regular rate and rhythm, S1S2 heard, no murmurs appreciated Pulm: clear to auscultation bilaterally, no wheezes, rhonchi or rales; normal work of breathing on room air Skin: Rous dermatitis appreciated along the right ventral forearm.     03/31/2023    3:31 PM 12/28/2022    3:20 PM 08/10/2022   11:00 AM  Depression screen PHQ 2/9  Decreased Interest 0 0 0  Down, Depressed, Hopeless 0 0 0  PHQ - 2 Score 0 0 0  Altered sleeping 0 0   Tired, decreased energy 0 0   Change in appetite 0 0   Feeling bad or failure about yourself  0 0   Trouble concentrating 0 0   Moving slowly or fidgety/restless 0 0   Suicidal thoughts 0 0   PHQ-9 Score 0 0   Difficult doing work/chores Not difficult at all        03/31/2023    3:31 PM 12/28/2022    3:21 PM 12/30/2021    4:10 PM 09/29/2021    3:12 PM  GAD 7 : Generalized Anxiety Score  Nervous,  Anxious, on Edge 0 0 0 0  Control/stop worrying 0 0 0 0  Worry too much - different things 0 0 0 0  Trouble relaxing 0 0 0 0  Restless 0 0 0 0  Easily annoyed or irritable 0 0 0 0  Afraid - awful might happen 0 0 0 0  Total GAD 7 Score 0 0 0 0  Anxiety Difficulty Not difficult at all Not difficult at all Not difficult at all Not difficult at all  Assessment/ Plan: 62 y.o. female   DDD (degenerative disc disease), lumbar - Plan: oxyCODONE-acetaminophen (PERCOCET) 10-325 MG tablet, oxyCODONE-acetaminophen (PERCOCET) 10-325 MG tablet, oxyCODONE-acetaminophen (PERCOCET) 10-325 MG tablet  Post-traumatic osteoarthritis of left knee - Plan: oxyCODONE-acetaminophen (PERCOCET) 10-325 MG tablet, oxyCODONE-acetaminophen (PERCOCET) 10-325 MG tablet, oxyCODONE-acetaminophen (PERCOCET) 10-325 MG tablet  Chronic right-sided low back pain without sciatica - Plan: oxyCODONE-acetaminophen (PERCOCET) 10-325 MG tablet, oxyCODONE-acetaminophen (PERCOCET) 10-325 MG tablet, oxyCODONE-acetaminophen (PERCOCET) 10-325 MG tablet  Poison oak dermatitis - Plan: triamcinolone cream (KENALOG) 0.1 %, methylPREDNISolone acetate (DEPO-MEDROL) injection 40 mg, levocetirizine (XYZAL) 5 MG tablet  Fatigue, unspecified type - Plan: Vitamin B12, CBC, CMP14+EGFR, TSH, T4, Free, VITAMIN D 25 Hydroxy (Vit-D Deficiency, Fractures)  Chronic pain stable.  UDS and CSC up-to-date.  National narcotic database reviewed and there were no red flags.  Meds have been postdated and renewed  For her poison oak dermatitis she was treated with a corticosteroid injection.  Start oral antihistamine.  Topical triamcinolone as needed itching or inflammation  Will look for metabolic etiology of fatigue though it may be related to stress.  No orders of the defined types were placed in this encounter.  No orders of the defined types were placed in this encounter.    Raliegh Ip, DO [facility] [phone]

## 2023-04-01 LAB — CBC
Hematocrit: 39.9 % (ref 34.0–46.6)
Hemoglobin: 12.9 g/dL (ref 11.1–15.9)
MCH: 30.9 pg (ref 26.6–33.0)
MCHC: 32.3 g/dL (ref 31.5–35.7)
MCV: 96 fL (ref 79–97)
Platelets: 319 10*3/uL (ref 150–450)
RBC: 4.18 x10E6/uL (ref 3.77–5.28)
RDW: 12.8 % (ref 11.7–15.4)
WBC: 7.9 10*3/uL (ref 3.4–10.8)

## 2023-04-01 LAB — CMP14+EGFR
ALT: 25 IU/L (ref 0–32)
AST: 22 IU/L (ref 0–40)
Albumin/Globulin Ratio: 1.5 (ref 1.2–2.2)
Albumin: 4.4 g/dL (ref 3.8–4.9)
Alkaline Phosphatase: 118 IU/L (ref 44–121)
BUN/Creatinine Ratio: 19 (ref 12–28)
BUN: 13 mg/dL (ref 8–27)
Bilirubin Total: 0.4 mg/dL (ref 0.0–1.2)
CO2: 23 mmol/L (ref 20–29)
Calcium: 9.5 mg/dL (ref 8.7–10.3)
Chloride: 103 mmol/L (ref 96–106)
Creatinine, Ser: 0.67 mg/dL (ref 0.57–1.00)
Globulin, Total: 3 g/dL (ref 1.5–4.5)
Glucose: 92 mg/dL (ref 70–99)
Potassium: 3.9 mmol/L (ref 3.5–5.2)
Sodium: 143 mmol/L (ref 134–144)
Total Protein: 7.4 g/dL (ref 6.0–8.5)
eGFR: 100 mL/min/{1.73_m2} (ref >59)

## 2023-04-01 LAB — T4, FREE: Free T4: 1.09 ng/dL (ref 0.82–1.77)

## 2023-04-01 LAB — VITAMIN B12: Vitamin B-12: 389 pg/mL (ref 232–1245)

## 2023-04-01 LAB — VITAMIN D 25 HYDROXY (VIT D DEFICIENCY, FRACTURES): Vit D, 25-Hydroxy: 20.1 ng/mL — ABNORMAL LOW (ref 30.0–100.0)

## 2023-04-01 LAB — TSH: TSH: 2.47 u[IU]/mL (ref 0.450–4.500)

## 2023-04-05 ENCOUNTER — Other Ambulatory Visit: Payer: Self-pay | Admitting: Family Medicine

## 2023-04-05 DIAGNOSIS — E559 Vitamin D deficiency, unspecified: Secondary | ICD-10-CM

## 2023-04-05 MED ORDER — VITAMIN D (ERGOCALCIFEROL) 1.25 MG (50000 UNIT) PO CAPS
50000.0000 [IU] | ORAL_CAPSULE | ORAL | 0 refills | Status: DC
Start: 2023-04-05 — End: 2023-06-02

## 2023-04-30 ENCOUNTER — Telehealth: Payer: Self-pay | Admitting: Family Medicine

## 2023-04-30 NOTE — Telephone Encounter (Signed)
  Incoming Patient Call  04/30/2023  What symptoms do you have? PT has poison oak all over her arms  How long have you been sick? 04/27/23  Have you been seen for this problem? Pt states that last time Dr. Reece Agar. Saw her she told her to just call her when she got poison oak and she would prescribed a steroid  If your provider decides to give you a prescription, which pharmacy would you like for it to be sent to[facility]  Patient informed that this information will be sent to the clinical staff for review and that they should receive a follow up call.

## 2023-04-30 NOTE — Telephone Encounter (Signed)
This has definitely NEVER been told to her.  I'm glad to accommodate a VIRTUAL visit if she would like.  Please let Kelci know if we need to schedule her in.

## 2023-04-30 NOTE — Telephone Encounter (Signed)
Scheduled apt for 05/03/2023

## 2023-05-03 ENCOUNTER — Telehealth (INDEPENDENT_AMBULATORY_CARE_PROVIDER_SITE_OTHER): Payer: Medicaid Other | Admitting: Family Medicine

## 2023-05-03 DIAGNOSIS — L237 Allergic contact dermatitis due to plants, except food: Secondary | ICD-10-CM | POA: Diagnosis not present

## 2023-05-03 MED ORDER — PREDNISONE 10 MG (21) PO TBPK
ORAL_TABLET | ORAL | 0 refills | Status: DC
Start: 2023-05-03 — End: 2023-06-01

## 2023-05-03 NOTE — Progress Notes (Signed)
MyChart Video visit  Subjective: ZO:XWRUEA oak PCP: Raliegh Ip, DO VWU:[id number] Ashley Marquez is a 62 y.o. female. Patient provides verbal consent for consult held via video.  Due to COVID-19 pandemic this visit was conducted virtually. This visit type was conducted due to national recommendations for restrictions regarding the COVID-19 Pandemic (Ashley.g. social distancing, sheltering in place) in an effort to limit this patient's exposure and mitigate transmission in our community. All issues noted in this document were discussed and addressed.  A physical exam was not performed with this format.   Location of patient: work Location of provider: [facility] Others present for call: none  1. Poison oak dermatits She was exposed Thursday but rash developed Friday.  She has been applying calamine lotion.  Heat makes it worse.  Affecting her right arm and leg.   ROS: Per HPI  Allergies  Allergen Reactions   Gabapentin     hallucination   Penicillins Nausea And Vomiting and Other (See Comments)    Patient slept for 2 days.  Has patient had a PCN reaction causing immediate rash, facial/tongue/throat swelling, SOB or lightheadedness with hypotension: No Has patient had a PCN reaction causing severe rash involving mucus membranes or skin necrosis: No Has patient had a PCN reaction that required hospitalization No Has patient had a PCN reaction occurring within the last 10 years: No If all of the above answers are "NO", then may proceed with Cephalosporin use.    Past Medical History:  Diagnosis Date   Anxiety    Heart murmur     Current Outpatient Medications:    albuterol (VENTOLIN HFA) 108 (90 Base) MCG/ACT inhaler, Inhale 2 puffs into the lungs every 6 (six) hours as needed for wheezing or shortness of breath., Disp: 8 g, Rfl: 0   diclofenac (VOLTAREN) 75 MG EC tablet, Take 1 tablet (75 mg total) by mouth 2 (two) times daily as needed for moderate pain., Disp: 60 tablet, Rfl: 1    fluticasone (FLONASE) 50 MCG/ACT nasal spray, Place 2 sprays into both nostrils daily., Disp: 16 g, Rfl: 6   levocetirizine (XYZAL) 5 MG tablet, Take 1 tablet (5 mg total) by mouth every evening. For allergies/ itching, Disp: 90 tablet, Rfl: 3   lisinopril (ZESTRIL) 20 MG tablet, Take 1 tablet (20 mg total) by mouth daily., Disp: 90 tablet, Rfl: 3   loperamide (IMODIUM A-D) 2 MG tablet, Take 1 tablet (2 mg total) by mouth 4 (four) times daily as needed for diarrhea or loose stools., Disp: 30 tablet, Rfl: 0   omeprazole (PRILOSEC) 20 MG capsule, TAKE ONE (1) CAPSULE EACH DAY, Disp: 90 capsule, Rfl: 3   ondansetron (ZOFRAN) 4 MG tablet, Take 1 tablet (4 mg total) by mouth every 8 (eight) hours as needed for nausea or vomiting., Disp: 20 tablet, Rfl: 0   oxyCODONE-acetaminophen (PERCOCET) 10-325 MG tablet, Take 0.5-1 tablets by mouth every 8 (eight) hours as needed for pain., Disp: 75 tablet, Rfl: 0   [START ON 05/30/2023] oxyCODONE-acetaminophen (PERCOCET) 10-325 MG tablet, Take 0.5-1 tablets by mouth every 8 (eight) hours as needed for pain., Disp: 75 tablet, Rfl: 0   oxyCODONE-acetaminophen (PERCOCET) 10-325 MG tablet, Take 0.5-1 tablets by mouth every 8 (eight) hours as needed for pain., Disp: 75 tablet, Rfl: 0   triamcinolone cream (KENALOG) 0.1 %, Apply 1 Application topically 2 (two) times daily as needed (poison oak)., Disp: 80 g, Rfl: 0   Vitamin D, Ergocalciferol, (DRISDOL) 1.25 MG (50000 UNIT) CAPS capsule, Take 1 capsule (  50,000 Units total) by mouth every 7 (seven) days. X12 weeks. Then start OTC Vit D 800 IU daily., Disp: 12 capsule, Rfl: 0  Gen: erythematous rash noted along the dorsal aspect of the RUE. No bleeding. No facial involvement.  Pulm: normal WOB on room air  Assessment/ Plan: 62 y.o. female   Poison oak dermatitis - Plan: predniSONE (STERAPRED UNI-PAK 21 TAB) 10 MG (21) TBPK tablet  Oral prednisone burst sent.  Reinforced need for long sleeves and long pants.  She will  contact me if any evidence of secondary bacterial infection arises.  Otherwise follow-up as needed  Start time: 12:43pm End time: 12:48pm   Total time spent on patient care (including video visit/ documentation): 5 minutes  Desiree Fleming Hulen Skains, DO [facility] [phone]

## 2023-06-01 ENCOUNTER — Emergency Department (HOSPITAL_COMMUNITY): Payer: Medicaid Other

## 2023-06-01 ENCOUNTER — Encounter (HOSPITAL_COMMUNITY): Payer: Self-pay | Admitting: Emergency Medicine

## 2023-06-01 ENCOUNTER — Other Ambulatory Visit (HOSPITAL_COMMUNITY): Payer: Medicaid Other

## 2023-06-01 ENCOUNTER — Inpatient Hospital Stay (HOSPITAL_COMMUNITY): Payer: Medicaid Other

## 2023-06-01 ENCOUNTER — Other Ambulatory Visit (HOSPITAL_COMMUNITY): Payer: Self-pay

## 2023-06-01 ENCOUNTER — Other Ambulatory Visit: Payer: Self-pay

## 2023-06-01 ENCOUNTER — Inpatient Hospital Stay (HOSPITAL_COMMUNITY)
Admission: EM | Admit: 2023-06-01 | Discharge: 2023-06-02 | DRG: 310 | Disposition: A | Discharge status: Home / Self Care | Payer: Medicaid Other | Attending: Internal Medicine | Admitting: Internal Medicine

## 2023-06-01 DIAGNOSIS — Z880 Allergy status to penicillin: Secondary | ICD-10-CM

## 2023-06-01 DIAGNOSIS — F419 Anxiety disorder, unspecified: Secondary | ICD-10-CM | POA: Diagnosis present

## 2023-06-01 DIAGNOSIS — I10 Essential (primary) hypertension: Secondary | ICD-10-CM

## 2023-06-01 DIAGNOSIS — Z7901 Long term (current) use of anticoagulants: Secondary | ICD-10-CM

## 2023-06-01 DIAGNOSIS — Z91128 Patient's intentional underdosing of medication regimen for other reason: Secondary | ICD-10-CM | POA: Diagnosis not present

## 2023-06-01 DIAGNOSIS — Z888 Allergy status to other drugs, medicaments and biological substances status: Secondary | ICD-10-CM | POA: Diagnosis not present

## 2023-06-01 DIAGNOSIS — E876 Hypokalemia: Secondary | ICD-10-CM | POA: Diagnosis not present

## 2023-06-01 DIAGNOSIS — I4891 Unspecified atrial fibrillation: Principal | ICD-10-CM | POA: Diagnosis present

## 2023-06-01 DIAGNOSIS — Z79899 Other long term (current) drug therapy: Secondary | ICD-10-CM

## 2023-06-01 DIAGNOSIS — R000 Tachycardia, unspecified: Secondary | ICD-10-CM | POA: Diagnosis not present

## 2023-06-01 DIAGNOSIS — E559 Vitamin D deficiency, unspecified: Secondary | ICD-10-CM | POA: Diagnosis present

## 2023-06-01 DIAGNOSIS — Z825 Family history of asthma and other chronic lower respiratory diseases: Secondary | ICD-10-CM | POA: Diagnosis not present

## 2023-06-01 DIAGNOSIS — G8929 Other chronic pain: Secondary | ICD-10-CM | POA: Diagnosis present

## 2023-06-01 DIAGNOSIS — R0689 Other abnormalities of breathing: Secondary | ICD-10-CM | POA: Diagnosis not present

## 2023-06-01 HISTORY — DX: Unspecified atrial fibrillation: I48.91

## 2023-06-01 LAB — CBC
HCT: 41.3 % (ref 36.0–46.0)
Hemoglobin: 13.9 g/dL (ref 12.0–15.0)
MCH: 31.5 pg (ref 26.0–34.0)
MCHC: 33.7 g/dL (ref 30.0–36.0)
MCV: 93.7 fL (ref 80.0–100.0)
Platelets: 314 10*3/uL (ref 150–400)
RBC: 4.41 MIL/uL (ref 3.87–5.11)
RDW: 13.1 % (ref 11.5–15.5)
WBC: 7 10*3/uL (ref 4.0–10.5)
nRBC: 0 % (ref 0.0–0.2)

## 2023-06-01 LAB — URINALYSIS, ROUTINE W REFLEX MICROSCOPIC
Bilirubin Urine: NEGATIVE
Glucose, UA: NEGATIVE mg/dL
Ketones, ur: NEGATIVE mg/dL
Leukocytes,Ua: NEGATIVE
Nitrite: NEGATIVE
Protein, ur: NEGATIVE mg/dL
Specific Gravity, Urine: 1.004 — ABNORMAL LOW (ref 1.005–1.030)
pH: 7 (ref 5.0–8.0)

## 2023-06-01 LAB — BASIC METABOLIC PANEL
Anion gap: 11 (ref 5–15)
BUN: 16 mg/dL (ref 8–23)
CO2: 23 mmol/L (ref 22–32)
Calcium: 9.2 mg/dL (ref 8.9–10.3)
Chloride: 104 mmol/L (ref 98–111)
Creatinine, Ser: 0.71 mg/dL (ref 0.44–1.00)
GFR, Estimated: >60 mL/min (ref >60)
Glucose, Bld: 126 mg/dL — ABNORMAL HIGH (ref 70–99)
Potassium: 2.7 mmol/L — CL (ref 3.5–5.1)
Sodium: 138 mmol/L (ref 135–145)

## 2023-06-01 LAB — MAGNESIUM: Magnesium: 2.2 mg/dL (ref 1.7–2.4)

## 2023-06-01 LAB — ECHOCARDIOGRAM COMPLETE
AV Mean grad: 4.8 mmHg
AV Peak grad: 11.4 mmHg
Ao pk vel: 1.69 m/s
Area-P 1/2: 3.06 cm2
Height: 65 in
P 1/2 time: 818 msec
S' Lateral: 2.7 cm
Weight: 2310.42 oz

## 2023-06-01 LAB — TSH: TSH: 6.772 u[IU]/mL — ABNORMAL HIGH (ref 0.350–4.500)

## 2023-06-01 LAB — HEMOGLOBIN A1C
Hgb A1c MFr Bld: 5.3 % (ref 4.8–5.6)
Mean Plasma Glucose: 105.41 mg/dL

## 2023-06-01 LAB — T4, FREE: Free T4: 0.87 ng/dL (ref 0.61–1.12)

## 2023-06-01 LAB — MRSA NEXT GEN BY PCR, NASAL: MRSA by PCR Next Gen: NOT DETECTED

## 2023-06-01 MED ORDER — POTASSIUM CHLORIDE CRYS ER 20 MEQ PO TBCR
40.0000 meq | EXTENDED_RELEASE_TABLET | Freq: Once | ORAL | Status: AC
  Administered 2023-06-01: 40 meq via ORAL
  Filled 2023-06-01: qty 2

## 2023-06-01 MED ORDER — ONDANSETRON HCL 4 MG/2ML IJ SOLN: 4.0000 mg | Freq: Four times a day (QID) | INTRAMUSCULAR | Status: DC | PRN

## 2023-06-01 MED ORDER — ONDANSETRON HCL 4 MG PO TABS: 4.0000 mg | ORAL_TABLET | Freq: Four times a day (QID) | ORAL | Status: DC | PRN

## 2023-06-01 MED ORDER — POTASSIUM CHLORIDE 10 MEQ/100ML IV SOLN
10.0000 meq | INTRAVENOUS | Status: AC
  Administered 2023-06-01 (×3): 10 meq via INTRAVENOUS
  Filled 2023-06-01 (×3): qty 100

## 2023-06-01 MED ORDER — ACETAMINOPHEN 650 MG RE SUPP: 650.0000 mg | Freq: Four times a day (QID) | RECTAL | Status: DC | PRN

## 2023-06-01 MED ORDER — DILTIAZEM LOAD VIA INFUSION
10.0000 mg | Freq: Once | INTRAVENOUS | Status: AC
  Administered 2023-06-01: 10 mg via INTRAVENOUS
  Filled 2023-06-01: qty 10

## 2023-06-01 MED ORDER — APIXABAN 5 MG PO TABS
5.0000 mg | ORAL_TABLET | Freq: Two times a day (BID) | ORAL | Status: DC
  Administered 2023-06-01 – 2023-06-02 (×3): 5 mg via ORAL
  Filled 2023-06-01 (×3): qty 1

## 2023-06-01 MED ORDER — POTASSIUM CHLORIDE 20 MEQ PO PACK
40.0000 meq | PACK | Freq: Once | ORAL | Status: AC
  Administered 2023-06-01: 40 meq via ORAL
  Filled 2023-06-01: qty 2

## 2023-06-01 MED ORDER — CHLORHEXIDINE GLUCONATE CLOTH 2 % EX PADS
6.0000 | MEDICATED_PAD | Freq: Every day | CUTANEOUS | Status: DC
  Administered 2023-06-01 – 2023-06-02 (×2): 6 via TOPICAL

## 2023-06-01 MED ORDER — OXYCODONE-ACETAMINOPHEN 10-325 MG PO TABS: 0.5000 | ORAL_TABLET | Freq: Three times a day (TID) | ORAL | Status: DC | PRN

## 2023-06-01 MED ORDER — ACETAMINOPHEN 325 MG PO TABS: 650.0000 mg | ORAL_TABLET | Freq: Four times a day (QID) | ORAL | Status: DC | PRN

## 2023-06-01 MED ORDER — PNEUMOCOCCAL 20-VAL CONJ VACC 0.5 ML IM SUSY: 0.5000 mL | PREFILLED_SYRINGE | INTRAMUSCULAR | Status: DC | PRN

## 2023-06-01 MED ORDER — DILTIAZEM HCL-DEXTROSE 125-5 MG/125ML-% IV SOLN (PREMIX)
5.0000 mg/h | INTRAVENOUS | Status: DC
  Administered 2023-06-01: 5 mg/h via INTRAVENOUS
  Filled 2023-06-01: qty 125

## 2023-06-01 MED ORDER — OXYCODONE-ACETAMINOPHEN 5-325 MG PO TABS: 1.0000 | ORAL_TABLET | Freq: Three times a day (TID) | ORAL | Status: DC | PRN

## 2023-06-01 MED ORDER — OXYCODONE HCL 5 MG PO TABS: 5.0000 mg | ORAL_TABLET | Freq: Three times a day (TID) | ORAL | Status: DC | PRN

## 2023-06-01 MED ORDER — DILTIAZEM HCL 30 MG PO TABS
30.0000 mg | ORAL_TABLET | Freq: Four times a day (QID) | ORAL | Status: DC
  Administered 2023-06-01 – 2023-06-02 (×4): 30 mg via ORAL
  Filled 2023-06-01 (×4): qty 1

## 2023-06-01 NOTE — H&P (Addendum)
History and Physical    Patient: Ashley Marquez:[id number] DOB: 1961-10-25 DOA: 06/01/2023 DOS: the patient was seen and examined on 06/01/2023 PCP: Raliegh Ip, DO  Patient coming from: Home  Chief Complaint:  Chief Complaint  Patient presents with   Tachycardia   HPI: Ashley Marquez is a 62 year old female with a history of hypertension and vitamin D deficiency presenting with palpitations.  Patient woke up around 2:30 AM on 06/01/2023 feeling her heart racing.  She checked her blood pressure and it was 175/101.  Initially, she resisted coming to the emergency department for further evaluation.  However, the patient relented after her daughter called EMS.  She was noted to have heart rate in the 150s by EMS. Notably, the patient had an episode of heart racing about 6 months prior to this admission.  She stated it lasted up to 10 minutes, but finally went away.  She has not had a recurrence. Patient denies fevers, chills, headache, chest pain, dyspnea, nausea, vomiting, diarrhea, abdominal pain, dysuria, hematuria, hematochezia, and melena. The patient denies any new medications.  She does not smoke or drink any alcohol.  She denies any OTC medications except for acetaminophen.  Notably, the patient has a history of hypertension for which she has been prescribed lisinopril.  She states that she does not take it appropriately. In the ED, the patient was afebrile and hemodynamically stable with oxygen saturation 97% on room air.  WBC 7.0, hemoglobin 13.9, platelets 240,000.  Sodium 138, potassium 2.7, bicarbonate 23, serum creatinine 0.71.  UA was negative for pyuria.  Chest x-ray was negative.  EKG showed atrial fibrillation with nonspecific ST changes.  TSH 6.772.  The patient was started on diltiazem drip.  Cardiology was consulted to assist with management.   Review of Systems: As mentioned in the history of present illness. All other systems reviewed and are negative. Past Medical  History:  Diagnosis Date   Anxiety    Heart murmur    Past Surgical History:  Procedure Laterality Date   ABDOMINAL HYSTERECTOMY     None     Social History:  reports that she has never smoked. She has never used smokeless tobacco. She reports that she does not drink alcohol and does not use drugs.  Allergies  Allergen Reactions   Gabapentin     hallucination   Penicillins Nausea And Vomiting and Other (See Comments)    Patient slept for 2 days.  Has patient had a PCN reaction causing immediate rash, facial/tongue/throat swelling, SOB or lightheadedness with hypotension: No Has patient had a PCN reaction causing severe rash involving mucus membranes or skin necrosis: No Has patient had a PCN reaction that required hospitalization No Has patient had a PCN reaction occurring within the last 10 years: No If all of the above answers are "NO", then may proceed with Cephalosporin use.     Family History  Problem Relation Age of Onset   Emphysema Father 108    Prior to Admission medications   Medication Sig Start Date End Date Taking? Authorizing Provider  albuterol (VENTOLIN HFA) 108 (90 Base) MCG/ACT inhaler Inhale 2 puffs into the lungs every 6 (six) hours as needed for wheezing or shortness of breath. 08/10/22   Daryll Drown, NP  diclofenac (VOLTAREN) 75 MG EC tablet Take 1 tablet (75 mg total) by mouth 2 (two) times daily as needed for moderate pain. 03/25/21   Raliegh Ip, DO  fluticasone (FLONASE) 50 MCG/ACT nasal spray Place  2 sprays into both nostrils daily. 10/21/18   Raliegh Ip, DO  levocetirizine (XYZAL) 5 MG tablet Take 1 tablet (5 mg total) by mouth every evening. For allergies/ itching 03/31/23   Delynn Flavin M, DO  lisinopril (ZESTRIL) 20 MG tablet Take 1 tablet (20 mg total) by mouth daily. 12/28/22   Raliegh Ip, DO  loperamide (IMODIUM A-D) 2 MG tablet Take 1 tablet (2 mg total) by mouth 4 (four) times daily as needed for diarrhea or  loose stools. 10/30/22   Daryll Drown, NP  omeprazole (PRILOSEC) 20 MG capsule TAKE ONE (1) CAPSULE EACH DAY 12/28/22   Gottschalk, Ashly M, DO  ondansetron (ZOFRAN) 4 MG tablet Take 1 tablet (4 mg total) by mouth every 8 (eight) hours as needed for nausea or vomiting. 10/30/22   Daryll Drown, NP  oxyCODONE-acetaminophen (PERCOCET) 10-325 MG tablet Take 0.5-1 tablets by mouth every 8 (eight) hours as needed for pain. 04/30/23   Raliegh Ip, DO  oxyCODONE-acetaminophen (PERCOCET) 10-325 MG tablet Take 0.5-1 tablets by mouth every 8 (eight) hours as needed for pain. 05/30/23   Raliegh Ip, DO  oxyCODONE-acetaminophen (PERCOCET) 10-325 MG tablet Take 0.5-1 tablets by mouth every 8 (eight) hours as needed for pain. 03/31/23   Raliegh Ip, DO  predniSONE (STERAPRED UNI-PAK 21 TAB) 10 MG (21) TBPK tablet As directed x 6 days 05/03/23   Delynn Flavin M, DO  triamcinolone cream (KENALOG) 0.1 % Apply 1 Application topically 2 (two) times daily as needed (poison oak). 03/31/23   Raliegh Ip, DO  Vitamin D, Ergocalciferol, (DRISDOL) 1.25 MG (50000 UNIT) CAPS capsule Take 1 capsule (50,000 Units total) by mouth every 7 (seven) days. X12 weeks. Then start OTC Vit D 800 IU daily. 04/05/23   Raliegh Ip, DO    Physical Exam: Vitals:   06/01/23 0615 06/01/23 0630 06/01/23 0636 06/01/23 0703  BP: 126/74 117/78    Pulse: 77 88 (!) 103   Resp: (!) 27 (!) 27 (!) 33   Temp:    97.9 F (36.6 C)  TempSrc:    Oral  SpO2: 99% 96% 97%   Weight:    65.5 kg  Height:    5\' 5"  (1.651 m)   GENERAL:  A&O x 3, NAD, well developed, cooperative, follows commands HEENT: [place]/AT, No thrush, No icterus, No oral ulcers Neck:  No neck mass, No meningismus, soft, supple CV: IRRR, no S3, no S4, no rub, no JVD Lungs:  CTA, no wheeze, no rhonchi, good air movement Abd: soft/NT +BS, nondistended Ext: No edema, no lymphangitis, no cyanosis, no rashes Neuro:  CN II-XII intact, strength 4/5 in  RUE, RLE, strength 4/5 LUE, LLE; sensation intact bilateral; no dysmetria; babinski equivocal  Data Reviewed: Data reviewed above in history  Assessment and Plan: New onset atrial fibrillation with RVR -CHADS-VASc = 2 (female/HTN) -Continue diltiazem drip with plan to transition to oral diltiazem if echo is reassuring -Echocardiogram -TSH 6.772 -Cardiology consult -Start apixaban  Essential hypertension -Patient was not taking her lisinopril as directed -Currently on diltiazem  Hypokalemia -Replete and IV -Magnesium 2.2  Opioid Dependence/Chronic pain -PDMP reviewed -percocet 10/325, #75, last refill 05/30/23  Vitamin D deficiency -Continue Drisdol    Advance Care Planning: FULL  Consults: cardiology  Family Communication: none  Severity of Illness: The appropriate patient status for this patient is OBSERVATION. Observation status is judged to be reasonable and necessary in order to provide the required intensity of service to  ensure the patient's safety. The patient's presenting symptoms, physical exam findings, and initial radiographic and laboratory data in the context of their medical condition is felt to place them at decreased risk for further clinical deterioration. Furthermore, it is anticipated that the patient will be medically stable for discharge from the hospital within 2 midnights of admission.   Author: Catarina Hartshorn, MD 06/01/2023 7:58 AM  For on call review [email or web address].

## 2023-06-01 NOTE — ED Triage Notes (Addendum)
Presents for heart racing (150bpm at home) and htn (172/102 at home), woke her from sleep.Was seen by EMS who recommended ER. Pt declined transport but pt came by POV.  Took 81mg  ASA and lisinopril at home pta.  Denies CP, SOB, fever Endorses previous nausea tonight but no longer present  In ER remains 155 bpm

## 2023-06-01 NOTE — Hospital Course (Addendum)
62 year old female with a history of hypertension and vitamin D deficiency presenting with palpitations.  Patient woke up around 2:30 AM on 06/01/2023 feeling her heart racing.  She checked her blood pressure and it was 175/101.  Initially, she resisted coming to the emergency department for further evaluation.  However, the patient relented after her daughter called EMS.  She was noted to have heart rate in the 150s by EMS. Notably, the patient had an episode of heart racing about 6 months prior to this admission.  She stated it lasted up to 10 minutes, but finally went away.  She has not had a recurrence. Patient denies fevers, chills, headache, chest pain, dyspnea, nausea, vomiting, diarrhea, abdominal pain, dysuria, hematuria, hematochezia, and melena. The patient denies any new medications.  She does not smoke or drink any alcohol.  She denies any OTC medications except for acetaminophen.  Notably, the patient has a history of hypertension for which she has been prescribed lisinopril.  She states that she does not take it appropriately. In the ED, the patient was afebrile and hemodynamically stable with oxygen saturation 97% on room air.  WBC 7.0, hemoglobin 13.9, platelets 240,000.  Sodium 138, potassium 2.7, bicarbonate 23, serum creatinine 0.71.  UA was negative for pyuria.  Chest x-ray was negative.  EKG showed atrial fibrillation with nonspecific ST changes.  TSH 6.772.  The patient was started on diltiazem drip.  Cardiology was consulted to assist with management.

## 2023-06-01 NOTE — Progress Notes (Signed)
  Echocardiogram 2D Echocardiogram has been performed.  Maren Reamer 06/01/2023, 10:48 AM

## 2023-06-01 NOTE — ED Provider Notes (Signed)
[place] EMERGENCY DEPARTMENT AT [facility] Provider Note   CSN: [id number] Arrival date & time: 06/01/23  0431     History  No chief complaint on file.   Ashley Marquez is a 62 y.o. female.  HPI     This is a 62 year old female who presents with palpitations.  Patient reports that she woke up with her heart racing this morning.  She denies chest pain or shortness of breath during this time.  She was evaluated by EMS who noted her heart rate in the 150s.  She declined transport.  No history of atrial fibrillation in the past.  She is not on any blood thinners.  Denies any recent changes in diet.  Denies stimulants or drug use.  She is a non-smoker.  No history of heart disease.  Currently only complaining of palpitations.  Home Medications Prior to Admission medications   Medication Sig Start Date End Date Taking? Authorizing Provider  albuterol (VENTOLIN HFA) 108 (90 Base) MCG/ACT inhaler Inhale 2 puffs into the lungs every 6 (six) hours as needed for wheezing or shortness of breath. 08/10/22   Daryll Drown, NP  diclofenac (VOLTAREN) 75 MG EC tablet Take 1 tablet (75 mg total) by mouth 2 (two) times daily as needed for moderate pain. 03/25/21   Raliegh Ip, DO  fluticasone (FLONASE) 50 MCG/ACT nasal spray Place 2 sprays into both nostrils daily. 10/21/18   Raliegh Ip, DO  levocetirizine (XYZAL) 5 MG tablet Take 1 tablet (5 mg total) by mouth every evening. For allergies/ itching 03/31/23   Delynn Flavin M, DO  lisinopril (ZESTRIL) 20 MG tablet Take 1 tablet (20 mg total) by mouth daily. 12/28/22   Raliegh Ip, DO  loperamide (IMODIUM A-D) 2 MG tablet Take 1 tablet (2 mg total) by mouth 4 (four) times daily as needed for diarrhea or loose stools. 10/30/22   Daryll Drown, NP  omeprazole (PRILOSEC) 20 MG capsule TAKE ONE (1) CAPSULE EACH DAY 12/28/22   Gottschalk, Ashly M, DO  ondansetron (ZOFRAN) 4 MG tablet Take 1 tablet (4 mg total) by mouth  every 8 (eight) hours as needed for nausea or vomiting. 10/30/22   Daryll Drown, NP  oxyCODONE-acetaminophen (PERCOCET) 10-325 MG tablet Take 0.5-1 tablets by mouth every 8 (eight) hours as needed for pain. 04/30/23   Raliegh Ip, DO  oxyCODONE-acetaminophen (PERCOCET) 10-325 MG tablet Take 0.5-1 tablets by mouth every 8 (eight) hours as needed for pain. 05/30/23   Raliegh Ip, DO  oxyCODONE-acetaminophen (PERCOCET) 10-325 MG tablet Take 0.5-1 tablets by mouth every 8 (eight) hours as needed for pain. 03/31/23   Raliegh Ip, DO  predniSONE (STERAPRED UNI-PAK 21 TAB) 10 MG (21) TBPK tablet As directed x 6 days 05/03/23   Delynn Flavin M, DO  triamcinolone cream (KENALOG) 0.1 % Apply 1 Application topically 2 (two) times daily as needed (poison oak). 03/31/23   Raliegh Ip, DO  Vitamin D, Ergocalciferol, (DRISDOL) 1.25 MG (50000 UNIT) CAPS capsule Take 1 capsule (50,000 Units total) by mouth every 7 (seven) days. X12 weeks. Then start OTC Vit D 800 IU daily. 04/05/23   Raliegh Ip, DO      Allergies    Gabapentin and Penicillins    Review of Systems   Review of Systems  Constitutional:  Negative for fever.  Respiratory:  Negative for shortness of breath.   Cardiovascular:  Positive for palpitations. Negative for chest pain and leg swelling.  All other systems reviewed and are negative.   Physical Exam Updated Vital Signs Wt 67.1 kg   BMI 24.63 kg/m  Physical Exam Vitals and nursing note reviewed.  Constitutional:      Appearance: She is well-developed. She is not ill-appearing.  HENT:     Head: Normocephalic and atraumatic.  Eyes:     Pupils: Pupils are equal, round, and reactive to light.  Cardiovascular:     Rate and Rhythm: Tachycardia present. Rhythm irregular.     Heart sounds: Normal heart sounds.  Pulmonary:     Effort: Pulmonary effort is normal. No respiratory distress.     Breath sounds: No wheezing.  Abdominal:     Palpations:  Abdomen is soft.     Tenderness: There is no abdominal tenderness.  Musculoskeletal:     Cervical back: Neck supple.     Right lower leg: No edema.     Left lower leg: No edema.  Skin:    General: Skin is warm and dry.  Neurological:     Mental Status: She is alert and oriented to person, place, and time.  Psychiatric:        Mood and Affect: Mood normal.     ED Results / Procedures / Treatments   Labs (all labs ordered are listed, but only abnormal results are displayed) Labs Reviewed - No data to display  EKG None  Radiology No results found.  Procedures .Critical Care  Performed by: Shon Baton, MD Authorized by: Shon Baton, MD   Critical care provider statement:    Critical care time (minutes):  31   Critical care was necessary to treat or prevent imminent or life-threatening deterioration of the following conditions: Atrial fibrillation with RVR.   Critical care was time spent personally by me on the following activities:  Development of treatment plan with patient or surrogate, discussions with consultants, evaluation of patient's response to treatment, examination of patient, ordering and review of laboratory studies, ordering and review of radiographic studies, ordering and performing treatments and interventions, pulse oximetry, re-evaluation of patient's condition and review of old charts     Medications Ordered in ED Medications - No data to display  ED Course/ Medical Decision Making/ A&P                             Medical Decision Making Amount and/or Complexity of Data Reviewed Labs: ordered. Radiology: ordered.  Risk Prescription drug management. Decision regarding hospitalization.   This patient presents to the ED for concern of palpitations, this involves an extensive number of treatment options, and is a complaint that carries with it a high risk of complications and morbidity.  I considered the following differential and  admission for this acute, potentially life threatening condition.  The differential diagnosis includes arrhythmia, metabolic derangement, ACS, PE, anxiety  MDM:    This is a 62 year old female who presents with palpitations.  Notably tachycardic.  EKG is consistent with atrial fibrillation with RVR.  Patient denies any history.  She is hemodynamically stable with heart rate in the 160s on my evaluation.  Blood pressure is normal.  Patient was given a diltiazem bolus and started on an infusion.  Lab work notable for potassium of 2.7.  This was replaced oral and IV.  Normal mag.  Normal CBC.  Chest x-ray without pneumothorax or pneumonia.  No evidence of acute heart failure.  On recheck, patient's heart rate is in  the 110s.  She is symptomatically improved.  Still in atrial fibrillation.  Will admit for echocardiogram and cardiology evaluation with transition potentially to anticoagulation. (Labs, imaging, consults)  Labs: I Ordered, and personally interpreted labs.  The pertinent results include: CBC, BMP, magnesium level, urinalysis  Imaging Studies ordered: I ordered imaging studies including chest x-ray I independently visualized and interpreted imaging. I agree with the radiologist interpretation  Additional history obtained from family at bedside.  External records from outside source obtained and reviewed including prior evaluations  Cardiac Monitoring: The patient was maintained on a cardiac monitor.  If on the cardiac monitor, I personally viewed and interpreted the cardiac monitored which showed an underlying rhythm of: Atrial fibrillation  Reevaluation: After the interventions noted above, I reevaluated the patient and found that they have :improved  Social Determinants of Health:  lives independently  Disposition: Admit  Co morbidities that complicate the patient evaluation  Past Medical History:  Diagnosis Date   Anxiety    Heart murmur      Medicines Meds ordered this  encounter  Medications   AND Linked Order Group    diltiazem (CARDIZEM) 1 mg/mL load via infusion 10 mg    diltiazem (CARDIZEM) 125 mg in dextrose 5% 125 mL (1 mg/mL) infusion   potassium chloride 10 mEq in 100 mL IVPB   potassium chloride (KLOR-CON) packet 40 mEq    I have reviewed the patients home medicines and have made adjustments as needed  Problem List / ED Course: Problem List Items Addressed This Visit   None Visit Diagnoses     Atrial fibrillation with RVR (HCC)    -  Primary   Relevant Medications   diltiazem (CARDIZEM) 1 mg/mL load via infusion 10 mg (Completed)   diltiazem (CARDIZEM) 125 mg in dextrose 5% 125 mL (1 mg/mL) infusion                   Final Clinical Impression(s) / ED Diagnoses Final diagnoses:  None    Rx / DC Orders ED Discharge Orders     None         Shon Baton, MD 06/01/23 [phone]

## 2023-06-01 NOTE — Consult Note (Addendum)
Cardiology Consultation   Patient ID: Ashley Marquez MRN: [id number]; DOB: January 30, 1961  Admit date: 06/01/2023 Date of Consult: 06/01/2023  PCP:  Raliegh Ip, DO   [facility] Providers Cardiologist: Evaluated by Dr. Antoine Poche in 2012 but no follow-up in the interim  Patient Profile:   Ashley Marquez is a 62 y.o. female with a hx of HTN (not currently taking medications), anxiety and GERD who is being seen 06/01/2023 for the evaluation of new-onset atrial fibrillation at the request of Dr. Arbutus Leas.  History of Present Illness:   Ashley Marquez presented to [facility] ED this morning for evaluation of palpitations with heart rate in the 150s when checked at home. She initially called EMS out to her home and declined transport, therefore she presented later in the morning with family members.  Initial labs showed WBC 7.0, Hgb 13.9, platelets 314, Na+ 138, K+ 2.7 and creatinine 0.71.  Magnesium 2.2. TSH elevated to 6.772 with free T4 pending. CXR with no acute abnormalities. EKG showing atrial fibrillation with RVR, heart rate 170 with diffuse ST depression along the inferior lateral leads. Echocardiogram pending.   She was started on IV Cardizem at the time of admission and potassium supplementation has also been ordered.  In talking with the patient today, she reports being in her normal state of health until 0230 this morning when she awoke from sleep due to palpitations. Checked her HR and BP and noted both were significantly elevated. Reports significant palpitations and fatigue at that time. No chest pain or dyspnea on exertion. No recent orthopnea, PND or pitting edema. No recent illness or sick contacts. Reports normal PO intake and has been consuming more water recently. Consumes 2-3 sodas daily. No coffee, energy drinks or alcohol. Reports she did have an argument with her daughter yesterday and wonders if stress contributed to her episode. She is unaware of any personal history  of CAD, CHF, CVA or DVT/PE.  Past Medical History:  Diagnosis Date   Anxiety    Heart murmur     Past Surgical History:  Procedure Laterality Date   ABDOMINAL HYSTERECTOMY     None       Home Medications:  Prior to Admission medications   Medication Sig Start Date End Date Taking? Authorizing Provider  albuterol (VENTOLIN HFA) 108 (90 Base) MCG/ACT inhaler Inhale 2 puffs into the lungs every 6 (six) hours as needed for wheezing or shortness of breath. 08/10/22   Daryll Drown, NP  diclofenac (VOLTAREN) 75 MG EC tablet Take 1 tablet (75 mg total) by mouth 2 (two) times daily as needed for moderate pain. 03/25/21   Raliegh Ip, DO  fluticasone (FLONASE) 50 MCG/ACT nasal spray Place 2 sprays into both nostrils daily. 10/21/18   Raliegh Ip, DO  levocetirizine (XYZAL) 5 MG tablet Take 1 tablet (5 mg total) by mouth every evening. For allergies/ itching 03/31/23   Delynn Flavin M, DO  lisinopril (ZESTRIL) 20 MG tablet Take 1 tablet (20 mg total) by mouth daily. 12/28/22   Raliegh Ip, DO  loperamide (IMODIUM A-D) 2 MG tablet Take 1 tablet (2 mg total) by mouth 4 (four) times daily as needed for diarrhea or loose stools. 10/30/22   Daryll Drown, NP  omeprazole (PRILOSEC) 20 MG capsule TAKE ONE (1) CAPSULE EACH DAY 12/28/22   Gottschalk, Ashly M, DO  ondansetron (ZOFRAN) 4 MG tablet Take 1 tablet (4 mg total) by mouth every 8 (eight) hours as needed  for nausea or vomiting. 10/30/22   Daryll Drown, NP  oxyCODONE-acetaminophen (PERCOCET) 10-325 MG tablet Take 0.5-1 tablets by mouth every 8 (eight) hours as needed for pain. 04/30/23   Raliegh Ip, DO  oxyCODONE-acetaminophen (PERCOCET) 10-325 MG tablet Take 0.5-1 tablets by mouth every 8 (eight) hours as needed for pain. 05/30/23   Raliegh Ip, DO  oxyCODONE-acetaminophen (PERCOCET) 10-325 MG tablet Take 0.5-1 tablets by mouth every 8 (eight) hours as needed for pain. 03/31/23   Raliegh Ip, DO   predniSONE (STERAPRED UNI-PAK 21 TAB) 10 MG (21) TBPK tablet As directed x 6 days 05/03/23   Delynn Flavin M, DO  triamcinolone cream (KENALOG) 0.1 % Apply 1 Application topically 2 (two) times daily as needed (poison oak). 03/31/23   Raliegh Ip, DO  Vitamin D, Ergocalciferol, (DRISDOL) 1.25 MG (50000 UNIT) CAPS capsule Take 1 capsule (50,000 Units total) by mouth every 7 (seven) days. X12 weeks. Then start OTC Vit D 800 IU daily. 04/05/23   Raliegh Ip, DO    Inpatient Medications: Scheduled Meds:  apixaban  5 mg Oral BID   potassium chloride  40 mEq Oral Once   Continuous Infusions:  diltiazem (CARDIZEM) infusion 10 mg/hr (06/01/23 0529)   potassium chloride 10 mEq (06/01/23 0905)   PRN Meds: acetaminophen **OR** acetaminophen, ondansetron **OR** ondansetron (ZOFRAN) IV, oxyCODONE-acetaminophen **AND** oxyCODONE  Allergies:    Allergies  Allergen Reactions   Gabapentin     hallucination   Penicillins Nausea And Vomiting and Other (See Comments)    Patient slept for 2 days.  Has patient had a PCN reaction causing immediate rash, facial/tongue/throat swelling, SOB or lightheadedness with hypotension: No Has patient had a PCN reaction causing severe rash involving mucus membranes or skin necrosis: No Has patient had a PCN reaction that required hospitalization No Has patient had a PCN reaction occurring within the last 10 years: No If all of the above answers are "NO", then may proceed with Cephalosporin use.     Social History:   Social History   Socioeconomic History   Marital status: Divorced    Spouse name: Not on file   Number of children: 2   Years of education: Not on file   Highest education level: Not on file  Occupational History    Comment: [employer]  Tobacco Use   Smoking status: Never   Smokeless tobacco: Never  Vaping Use   Vaping Use: Never used  Substance and Sexual Activity   Alcohol use: No   Drug use: No   Sexual activity: Not  Currently  Other Topics Concern   Not on file  Social History Narrative   Not on file   Social Determinants of Health   Financial Resource Strain: Not on file  Food Insecurity: No Food Insecurity (06/01/2023)   Hunger Vital Sign    Worried About Running Out of Food in the Last Year: Never true    Ran Out of Food in the Last Year: Never true  Transportation Needs: No Transportation Needs (06/01/2023)   PRAPARE - Administrator, Civil Service (Medical): No    Lack of Transportation (Non-Medical): No  Physical Activity: Not on file  Stress: Not on file  Social Connections: Not on file  Intimate Partner Violence: Not At Risk (06/01/2023)   Humiliation, Afraid, Rape, and Kick questionnaire    Fear of Current or Ex-Partner: No    Emotionally Abused: No    Physically Abused: No  Sexually Abused: No    Family History:    Family History  Problem Relation Age of Onset   Deep vein thrombosis Mother    Emphysema Father 45     ROS:  Please see the history of present illness.   All other ROS reviewed and negative.     Physical Exam/Data:   Vitals:   06/01/23 0636 06/01/23 0703 06/01/23 0800 06/01/23 0808  BP:    110/68  Pulse: (!) 103  82 60  Resp: (!) 33  14 (!) 23  Temp:  97.9 F (36.6 C)    TempSrc:  Oral    SpO2: 97%  100% 99%  Weight:  65.5 kg    Height:  5\' 5"  (1.651 m)     No intake or output data in the 24 hours ending 06/01/23 0938    06/01/2023    7:03 AM 06/01/2023    4:42 AM 03/31/2023    3:30 PM  Last 3 Weights  Weight (lbs) 144 lb 6.4 oz 148 lb 147 lb  Weight (kg) 65.5 kg 67.132 kg 66.679 kg     Body mass index is 24.03 kg/m.  General:  Well nourished, well developed female appearing in no acute distress.  HEENT: normal Neck: no JVD Vascular: No carotid bruits; Distal pulses 2+ bilaterally Cardiac:  normal S1, S2; Irregularly irregular.  Lungs:  clear to auscultation bilaterally, no wheezing, rhonchi or rales  Abd: soft, nontender, no  hepatomegaly  Ext: no edema Musculoskeletal:  No deformities, BUE and BLE strength normal and equal Skin: warm and dry  Neuro:  CNs 2-12 intact, no focal abnormalities noted Psych:  Normal affect   EKG:  The EKG was personally reviewed and demonstrates: Atrial fibrillation with RVR, heart rate 170 with diffuse ST depression along the inferior lateral leads  Telemetry:  Telemetry was personally reviewed and demonstrates: Atrial fibrillation, HR in 80's to 110's.   Relevant CV Studies:  Echocardiogram: Pending  Laboratory Data:  High Sensitivity Troponin:  No results for input(s): "TROPONINIHS" in the last 720 hours.   Chemistry Recent Labs  Lab 06/01/23 0447  NA 138  K 2.7*  CL 104  CO2 23  GLUCOSE 126*  BUN 16  CREATININE 0.71  CALCIUM 9.2  MG 2.2  GFRNONAA >60  ANIONGAP 11    No results for input(s): "PROT", "ALBUMIN", "AST", "ALT", "ALKPHOS", "BILITOT" in the last 168 hours. Lipids No results for input(s): "CHOL", "TRIG", "HDL", "LABVLDL", "LDLCALC", "CHOLHDL" in the last 168 hours.  Hematology Recent Labs  Lab 06/01/23 0447  WBC 7.0  RBC 4.41  HGB 13.9  HCT 41.3  MCV 93.7  MCH 31.5  MCHC 33.7  RDW 13.1  PLT 314   Thyroid  Recent Labs  Lab 06/01/23 0447  TSH 6.772*    BNPNo results for input(s): "BNP", "PROBNP" in the last 168 hours.  DDimer No results for input(s): "DDIMER" in the last 168 hours.   Radiology/Studies:  DG Chest Port 1 View  Result Date: 06/01/2023 CLINICAL DATA:  Atrial fibrillation. EXAM: PORTABLE CHEST 1 VIEW COMPARISON:  12/05/2015 FINDINGS: Heart size and mediastinal contours are unremarkable. No pleural fluid, interstitial edema or airspace disease. Visualized osseous structures appear intact. IMPRESSION: Negative portable chest. Electronically Signed   By: Signa Kell M.D.   On: 06/01/2023 05:32     Assessment and Plan:   1. Atrial Fibrillation with RVR - New diagnosis for the patient this admission and she reports an  abrupt onset of symptoms around  0230. Rates have significantly improved with IV Cardizem and are in the 80's to low-100's with rest. Would continue IV Cardizem for now and plan to switch to PO Cardizem once echo has resulted. If EF reduced, would use beta-blocker therapy. Continue to replete K+ and Free T4 also pending.  - Her CHA2DS2-VASc Score is 2 and agree with starting Eliquis 5mg  BID for anticoagulation. If rates remain well-controlled with PO medications, could arrange for outpatient DCCV following 3 weeks of anticoagulation. If rates poorly controlled with activity, may need to consider a TEE/DCCV this admission (did not undergo DCCV in the ED as she reported a 5-minute episode of palpitations approximately 6 months ago).   2. Hypokalemia - K+ at 2.7 on admission and supplementation has been ordered.   3. Abnormal TSH - TSH at 6.772 on admission. Free T4 pending.  4. HTN - She reports a history of this and was previously taking Lisinopril 20mg  but does not take this regularly.  BP was at 146/89 upon arrival. Lisinopril currently held to allow for initiation of AV nodal blocking agents.    Risk Assessment/Risk Scores:    CHA2DS2-VASc Score = 2   This indicates a 2.2% annual risk of stroke. The patient's score is based upon: CHF History: 0 HTN History: 1 Diabetes History: 0 Stroke History: 0 Vascular Disease History: 0 Age Score: 0 Gender Score: 1    For questions or updates, please contact [facility] Please consult [email or web address] for contact info under    Signed, Ellsworth Lennox, PA-C  06/01/2023 9:38 AM   Attending note   Patient seen and discussed with PA Iran Ouch, I agree with her documentation. 62 yo female history of HTN presents with palpitations that awoke her from sleep. Reports a similar episode 6 months prior that lasted about 5 minutes. This episode prior to admission most intense and longest in duration.      K 2.7 BUN 16 Cr 0.71 Mg 2.2 WBC 7 Hgb  13.9 Plt 314 TSH 6.77 EKG afib with RVR CXR: no acute process     1.Afib, new diagnosis this admission - presented with palpitations. EKG showed afib with RVR - K was 2.7 on presentation, normal Mg - started on dilt gtt -CHADS2Vasc score is 2, anticoag would be only a IIA recommendation. Discussed associated risks and benefits, she favors anticoagulation at this time. Would also be of benefit to already be on if becomes neccesary for DCCV in the near future. Continue eliquis 5mg  bid.  - start oral dilt 30mg  every 6 hours with hold parameters, wean dilt gtt.  - f/u echo     2.Hypokalemia - K was 2.7 on admission, on oral and IV replacement     Dina Rich MD

## 2023-06-01 NOTE — Discharge Instructions (Signed)

## 2023-06-01 NOTE — ED Notes (Signed)
Pt reports improvement, no longer feels palpitations at this time

## 2023-06-01 NOTE — Progress Notes (Signed)
Transition of Care [facility]) - Inpatient Brief Assessment   Patient Details  Name: Ashley Marquez MRN: [id number] Date of Birth: 26-Nov-1961  Transition of Care [facility]) CM/SW Contact:    Annice Needy, LCSW Phone Number: 06/01/2023, 11:22 AM   Clinical Narrative: Transition of Care Department [facility]) has reviewed patient and no TOC needs have been identified at this time. We will continue to monitor patient advancement through interdisciplinary progression rounds. If new patient transition needs arise, please place a TOC consult.   Transition of Care Asessment: Insurance and Status: Insurance coverage has been reviewed Patient has primary care physician: Yes Home environment has been reviewed: from home with parent Prior level of function:: functional Prior/Current Home Services: No current home services Social Determinants of Health Reivew: SDOH reviewed no interventions necessary Readmission risk has been reviewed: Yes Transition of care needs: no transition of care needs at this time

## 2023-06-01 NOTE — TOC Benefit Eligibility Note (Signed)
Pharmacy Patient Advocate Encounter  Insurance verification completed.    The patient is insured through [facility] MEDICAID   Ran test claim for Eliquis 5 mg and the current 30 day co-pay is $4.00.   This test claim was processed through [facility]- copay amounts may vary at other pharmacies due to pharmacy/plan contracts, or as the patient moves through the different stages of their insurance plan.    Roland Earl, CPHT Pharmacy Patient Advocate Specialist [facility] Patient Advocate Team Direct Number: [phone]  Fax: [phone]

## 2023-06-02 DIAGNOSIS — E876 Hypokalemia: Secondary | ICD-10-CM | POA: Diagnosis not present

## 2023-06-02 DIAGNOSIS — I4891 Unspecified atrial fibrillation: Secondary | ICD-10-CM | POA: Diagnosis not present

## 2023-06-02 DIAGNOSIS — I10 Essential (primary) hypertension: Secondary | ICD-10-CM | POA: Diagnosis not present

## 2023-06-02 LAB — HIV ANTIBODY (ROUTINE TESTING W REFLEX): HIV Screen 4th Generation wRfx: NONREACTIVE

## 2023-06-02 LAB — CBC
HCT: 41.1 % (ref 36.0–46.0)
Hemoglobin: 13.5 g/dL (ref 12.0–15.0)
MCH: 31.4 pg (ref 26.0–34.0)
MCHC: 32.8 g/dL (ref 30.0–36.0)
MCV: 95.6 fL (ref 80.0–100.0)
Platelets: 329 10*3/uL (ref 150–400)
RBC: 4.3 MIL/uL (ref 3.87–5.11)
RDW: 13.5 % (ref 11.5–15.5)
WBC: 8.2 10*3/uL (ref 4.0–10.5)
nRBC: 0 % (ref 0.0–0.2)

## 2023-06-02 LAB — BASIC METABOLIC PANEL
Anion gap: 8 (ref 5–15)
BUN: 13 mg/dL (ref 8–23)
CO2: 21 mmol/L — ABNORMAL LOW (ref 22–32)
Calcium: 8.9 mg/dL (ref 8.9–10.3)
Chloride: 106 mmol/L (ref 98–111)
Creatinine, Ser: 0.7 mg/dL (ref 0.44–1.00)
GFR, Estimated: >60 mL/min (ref >60)
Glucose, Bld: 109 mg/dL — ABNORMAL HIGH (ref 70–99)
Potassium: 3.8 mmol/L (ref 3.5–5.1)
Sodium: 135 mmol/L (ref 135–145)

## 2023-06-02 LAB — MAGNESIUM: Magnesium: 2.3 mg/dL (ref 1.7–2.4)

## 2023-06-02 MED ORDER — APIXABAN 5 MG PO TABS: 5.0000 mg | ORAL_TABLET | Freq: Two times a day (BID) | ORAL | 0 refills | Status: DC

## 2023-06-02 MED ORDER — DILTIAZEM HCL ER COATED BEADS 180 MG PO CP24: 180.0000 mg | ORAL_CAPSULE | Freq: Every day | ORAL | 0 refills | Status: DC

## 2023-06-02 MED ORDER — POTASSIUM CHLORIDE CRYS ER 20 MEQ PO TBCR
40.0000 meq | EXTENDED_RELEASE_TABLET | Freq: Once | ORAL | Status: AC
  Administered 2023-06-02: 40 meq via ORAL
  Filled 2023-06-02: qty 2

## 2023-06-02 MED ORDER — DILTIAZEM HCL 60 MG PO TABS
60.0000 mg | ORAL_TABLET | Freq: Three times a day (TID) | ORAL | Status: DC
  Administered 2023-06-02: 60 mg via ORAL
  Filled 2023-06-02: qty 1

## 2023-06-02 MED ORDER — LISINOPRIL 10 MG PO TABS: 10.0000 mg | ORAL_TABLET | Freq: Every day | ORAL | Status: DC

## 2023-06-02 MED ORDER — POTASSIUM CHLORIDE CRYS ER 20 MEQ PO TBCR: 40.0000 meq | EXTENDED_RELEASE_TABLET | Freq: Once | ORAL | Status: DC

## 2023-06-02 MED ORDER — DILTIAZEM HCL ER COATED BEADS 180 MG PO CP24: 180.0000 mg | ORAL_CAPSULE | Freq: Every day | ORAL | Status: DC

## 2023-06-02 MED ORDER — DILTIAZEM HCL 30 MG PO TABS
30.0000 mg | ORAL_TABLET | Freq: Once | ORAL | Status: AC
  Administered 2023-06-02: 30 mg via ORAL
  Filled 2023-06-02: qty 1

## 2023-06-02 MED ORDER — OXYCODONE-ACETAMINOPHEN 5-325 MG PO TABS: 1.0000 | ORAL_TABLET | Freq: Three times a day (TID) | ORAL | Status: DC | PRN

## 2023-06-02 NOTE — Assessment & Plan Note (Addendum)
Patient continue in atrial fibrillation, her rate is 90 to 100. Echocardiogram with preserved LV systolic function EF 60 to 65%, no LVH, RV systolic function is preserved, RVSP 27.8 mmHg. No significant valvular disease.   Now is off diltiazem drip and transitioned to oral diltiazem.  Plan to increase dose to 60 mg q 6 hrs, and if good rate control to transition to long acting formulation.  Chads 2 vasc score is 2, decision to start anticoagulation with apixaban.  Ok to transfer to telemetry ward.

## 2023-06-02 NOTE — Progress Notes (Signed)
Rounding Note    Patient Name: Ashley Marquez Date of Encounter: 06/02/2023  [facility] Cardiologist: Howell Rucks  Subjective   No complaints  Inpatient Medications    Scheduled Meds:  apixaban  5 mg Oral BID   Chlorhexidine Gluconate Cloth  6 each Topical Daily   diltiazem  30 mg Oral Q6H   Continuous Infusions:  diltiazem (CARDIZEM) infusion 10 mg/hr (06/01/23 0529)   PRN Meds: acetaminophen **OR** acetaminophen, ondansetron **OR** ondansetron (ZOFRAN) IV, oxyCODONE-acetaminophen **AND** oxyCODONE, pneumococcal 20-valent conjugate vaccine   Vital Signs    Vitals:   06/02/23 0451 06/02/23 0500 06/02/23 0558 06/02/23 0700  BP:  131/70 131/70 120/65  Pulse:  (!) 58  79  Resp:      Temp: 98.3 F (36.8 C)   99.1 F (37.3 C)  TempSrc: Oral   Oral  SpO2:  99%  94%  Weight: 66.8 kg     Height:        Intake/Output Summary (Last 24 hours) at 06/02/2023 0813 Last data filed at 06/02/2023 0400 Gross per 24 hour  Intake 146.01 ml  Output --  Net 146.01 ml      06/02/2023    4:51 AM 06/01/2023    7:03 AM 06/01/2023    4:42 AM  Last 3 Weights  Weight (lbs) 147 lb 4.3 oz 144 lb 6.4 oz 148 lb  Weight (kg) 66.8 kg 65.5 kg 67.132 kg      Telemetry    Afib variable rates - Personally Reviewed  ECG    N/a - Personally Reviewed  Physical Exam   GEN: No acute distress.   Neck: No JVD Cardiac: irreg, tachy Respiratory: Clear to auscultation bilaterally. GI: Soft, nontender, non-distended  MS: No edema; No deformity. Neuro:  Nonfocal  Psych: Normal affect   Labs    High Sensitivity Troponin:  No results for input(s): "TROPONINIHS" in the last 720 hours.   Chemistry Recent Labs  Lab 06/01/23 0447 06/02/23 0506  NA 138 135  K 2.7* 3.8  CL 104 106  CO2 23 21*  GLUCOSE 126* 109*  BUN 16 13  CREATININE 0.71 0.70  CALCIUM 9.2 8.9  MG 2.2 2.3  GFRNONAA >60 >60  ANIONGAP 11 8    Lipids No results for input(s): "CHOL", "TRIG", "HDL", "LABVLDL",  "LDLCALC", "CHOLHDL" in the last 168 hours.  Hematology Recent Labs  Lab 06/01/23 0447 06/02/23 0506  WBC 7.0 8.2  RBC 4.41 4.30  HGB 13.9 13.5  HCT 41.3 41.1  MCV 93.7 95.6  MCH 31.5 31.4  MCHC 33.7 32.8  RDW 13.1 13.5  PLT 314 329   Thyroid  Recent Labs  Lab 06/01/23 0447 06/01/23 0800  TSH 6.772*  --   FREET4  --  0.87    BNPNo results for input(s): "BNP", "PROBNP" in the last 168 hours.  DDimer No results for input(s): "DDIMER" in the last 168 hours.   Radiology    ECHOCARDIOGRAM COMPLETE  Result Date: 06/01/2023    ECHOCARDIOGRAM REPORT   Patient Name:   Ashley Marquez [place] Date of Exam: 06/01/2023 Medical Rec #:  [id number]      Height:       65.0 in Accession #:    [id number]     Weight:       144.4 lb Date of Birth:  Feb 18, 1961      BSA:          1.722 m Patient Age:    62 years  BP:           131/69 mmHg Patient Gender: F              HR:           90 bpm. [facility] Procedure: 2D Echo, Color Doppler and Cardiac Doppler Indications:    Atrial Fibrillation I48.91  History:        Patient has no prior history of Echocardiogram examinations.                 Arrythmias:Atrial Fibrillation, Signs/Symptoms:Murmur; Risk                 Factors:Hypertension and Non-Smoker.  Sonographer:    Aron Baba Referring Phys: [id number] ASIA B ZIERLE-GHOSH IMPRESSIONS  1. Left ventricular ejection fraction, by estimation, is 60 to 65%. The left ventricle has normal function. The left ventricle has no regional wall motion abnormalities. Left ventricular diastolic parameters are indeterminate.  2. Right ventricular systolic function is normal. The right ventricular size is normal. There is normal pulmonary artery systolic pressure.  3. The mitral valve is normal in structure. Mild mitral valve regurgitation. No evidence of mitral stenosis.  4. The tricuspid valve is abnormal.  5. The aortic valve is tricuspid. Aortic valve regurgitation is mild. No aortic stenosis is present.  6. The  inferior vena cava is normal in size with greater than 50% respiratory variability, suggesting right atrial pressure of 3 mmHg. FINDINGS  Left Ventricle: LVOT pulse wave Doppler was not performed. Left ventricular ejection fraction, by estimation, is 60 to 65%. The left ventricle has normal function. The left ventricle has no regional wall motion abnormalities. The left ventricular internal cavity size was normal in size. There is no left ventricular hypertrophy. Left ventricular diastolic parameters are indeterminate. Right Ventricle: The right ventricular size is normal. Right vetricular wall thickness was not well visualized. Right ventricular systolic function is normal. There is normal pulmonary artery systolic pressure. The tricuspid regurgitant velocity is 2.49 m/s, and with an assumed right atrial pressure of 3 mmHg, the estimated right ventricular systolic pressure is 27.8 mmHg. Left Atrium: Left atrial size was normal in size. Right Atrium: Right atrial size was normal in size. Pericardium: There is no evidence of pericardial effusion. Mitral Valve: The mitral valve is normal in structure. Mild mitral valve regurgitation. No evidence of mitral valve stenosis. Tricuspid Valve: The tricuspid valve is abnormal. Tricuspid valve regurgitation is mild . No evidence of tricuspid stenosis. Aortic Valve: The aortic valve is tricuspid. Aortic valve regurgitation is mild. Aortic regurgitation PHT measures 818 msec. No aortic stenosis is present. Aortic valve mean gradient measures 4.8 mmHg. Aortic valve peak gradient measures 11.4 mmHg. Pulmonic Valve: The pulmonic valve was not well visualized. Pulmonic valve regurgitation is not visualized. No evidence of pulmonic stenosis. Aorta: The aortic root is normal in size and structure. Venous: The inferior vena cava is normal in size with greater than 50% respiratory variability, suggesting right atrial pressure of 3 mmHg. IAS/Shunts: The interatrial septum was not well  visualized.  LEFT VENTRICLE PLAX 2D LVIDd:         4.10 cm   Diastology LVIDs:         2.70 cm   LV e' medial:    8.94 cm/s LV PW:         0.70 cm   LV E/e' medial:  14.0 LV IVS:        0.70 cm   LV e'  lateral:   11.10 cm/s LVOT diam:     1.90 cm   LV E/e' lateral: 11.3 LVOT Area:     2.84 cm  RIGHT VENTRICLE RV S prime:     10.90 cm/s TAPSE (M-mode): 1.2 cm LEFT ATRIUM             Index        RIGHT ATRIUM           Index LA diam:        3.40 cm 1.97 cm/m   RA Area:     10.90 cm LA Vol (A2C):   43.3 ml 25.14 ml/m  RA Volume:   19.80 ml  11.50 ml/m LA Vol (A4C):   20.3 ml 11.79 ml/m LA Biplane Vol: 32.6 ml 18.93 ml/m  AORTIC VALVE AV Vmax:      168.93 cm/s AV Vmean:     98.517 cm/s AV VTI:       0.260 m AV Peak Grad: 11.4 mmHg AV Mean Grad: 4.8 mmHg AI PHT:       818 msec  AORTA Ao Root diam: 3.20 cm Ao Asc diam:  2.90 cm MITRAL VALVE                TRICUSPID VALVE MV Area (PHT): 3.06 cm     TR Peak grad:   24.8 mmHg MV Decel Time: 248 msec     TR Vmax:        249.00 cm/s MV E velocity: 125.00 cm/s                             SHUNTS                             Systemic Diam: 1.90 cm Dina Rich MD Electronically signed by Dina Rich MD Signature Date/Time: 06/01/2023/10:56:38 AM    Final    DG Chest Port 1 View  Result Date: 06/01/2023 CLINICAL DATA:  Atrial fibrillation. EXAM: PORTABLE CHEST 1 VIEW COMPARISON:  12/05/2015 FINDINGS: Heart size and mediastinal contours are unremarkable. No pleural fluid, interstitial edema or airspace disease. Visualized osseous structures appear intact. IMPRESSION: Negative portable chest. Electronically Signed   By: Signa Kell M.D.   On: 06/01/2023 05:32    Cardiac Studies   05/2023 echo 1. Left ventricular ejection fraction, by estimation, is 60 to 65%. The  left ventricle has normal function. The left ventricle has no regional  wall motion abnormalities. Left ventricular diastolic parameters are  indeterminate.   2. Right ventricular systolic  function is normal. The right ventricular  size is normal. There is normal pulmonary artery systolic pressure.   3. The mitral valve is normal in structure. Mild mitral valve  regurgitation. No evidence of mitral stenosis.   4. The tricuspid valve is abnormal.   5. The aortic valve is tricuspid. Aortic valve regurgitation is mild. No  aortic stenosis is present.   6. The inferior vena cava is normal in size with greater than 50%  respiratory variability, suggesting right atrial pressure of 3 mmHg.     Patient Profile     Ashley Marquez is a 62 y.o. female with a hx of HTN (not currently taking medications), anxiety and GERD who is being seen 06/01/2023 for the evaluation of new-onset atrial fibrillation at the request of Dr. Arbutus Leas.   Assessment & Plan    1.Afib, new diagnosis  this admission - presented with palpitations. EKG showed afib with RVR - K was 2.7 on presentation, normal Mg - started on dilt gtt, oral dilt added yesterday 30mg  every 6 hrs.  -CHADS2Vasc score is 2, anticoag would be only a IIA recommendation. Discussed associated risks and benefits, she favors anticoagulation at this time. Would also be of benefit to already be on if becomes neccesary for DCCV in the near future. Continue eliquis 5mg  bid.   -off dilt gtt, variable heart rates. Increase oral dilt to 60mg  tid, if rates controlled into the afternoon could transition to oral long acting dilt 180mg  and plan for discharge. If ongoing high rates would need to keep and titrate dilt further as inpatient.       For questions or updates, please contact [facility] Please consult [email or web address] for contact info under        Signed, Dina Rich, MD  06/02/2023, 8:13 AM

## 2023-06-02 NOTE — Progress Notes (Signed)
Progress Note   Patient: Ashley Marquez NWG:[id number] DOB: [birth year] DOA: 06/01/2023     1 DOS: the patient was seen and examined on 06/02/2023   Brief hospital course: 62 year old female with a history of hypertension and vitamin D deficiency presenting with palpitations.  Patient woke up around 2:30 AM on 06/01/2023 feeling her heart racing.  She checked her blood pressure and it was 175/101.  Initially, she resisted coming to the emergency department for further evaluation.  However, the patient relented after her daughter called EMS.  She was noted to have heart rate in the 150s by EMS. Notably, the patient had an episode of heart racing about 6 months prior to this admission.  She stated it lasted up to 10 minutes, but finally went away.  She has not had a recurrence. Patient denies fevers, chills, headache, chest pain, dyspnea, nausea, vomiting, diarrhea, abdominal pain, dysuria, hematuria, hematochezia, and melena. The patient denies any new medications.  She does not smoke or drink any alcohol.  She denies any OTC medications except for acetaminophen.  Notably, the patient has a history of hypertension for which she has been prescribed lisinopril.  She states that she does not take it appropriately. In the ED, the patient was afebrile and hemodynamically stable with oxygen saturation 97% on room air.  WBC 7.0, hemoglobin 13.9, platelets 240,000.  Sodium 138, potassium 2.7, bicarbonate 23, serum creatinine 0.71.  UA was negative for pyuria.  Chest x-ray was negative.  EKG showed atrial fibrillation with nonspecific ST changes.  TSH 6.772.  The patient was started on diltiazem drip.  Cardiology was consulted to assist with management.  Assessment and Plan: * Atrial fibrillation with RVR (HCC) Patient continue in atrial fibrillation, her rate is 90 to 100. Echocardiogram with preserved LV systolic function EF 60 to 65%, no LVH, RV systolic function is preserved, RVSP 27.8 mmHg. No significant  valvular disease.   Now is off diltiazem drip and transitioned to oral diltiazem.  Plan to increase dose to 60 mg q 6 hrs, and if good rate control to transition to long acting formulation.  Chads 2 vasc score is 2, decision to start anticoagulation with apixaban.  Ok to transfer to telemetry ward.   Essential hypertension Systolic blood pressure 109 to 136 mmHg.   Will resume lisinopril for blood pressure control. At home she is on 20 mg will start with 10 mg for now.  Continue blood pressure monitoring.   Hypokalemia Renal function with serum cr at 0,70 with K at 3,8 and serum bicarbonate at 21. Na 135 and Mg 2.3   Add 40 meq kcl po. Follow up renal function and electrolytes in am.        Subjective: Patient is feeling better, no chest pain or dyspnea, no edema. At home is very active.   Physical Exam: Vitals:   06/02/23 0500 06/02/23 0558 06/02/23 0700 06/02/23 0930  BP: 131/70 131/70 120/65   Pulse: (!) 58  79 (!) 42  Resp:    (!) 26  Temp:   99.1 F (37.3 C)   TempSrc:   Oral   SpO2: 99%  94% 98%  Weight:      Height:       Neurology awake and alert ENT with no pallor Cardiovascular with S1 and S2 present, irregularly irregular with no gallops, rubs or murmurs No JVD No lower extremity edema Respiratory with no rales or wheezing Abdomen with no distention  Data Reviewed:    Family Communication:  I spoke with patient's daughter at the bedside, we talked in detail about patient's condition, plan of care and prognosis and all questions were addressed.   Disposition: Status is: Inpatient Remains inpatient appropriate because: telemetry monitoring   Planned Discharge Destination: Home    Author: Coralie Keens, MD 06/02/2023 10:22 AM  For on call review [email or web address].

## 2023-06-02 NOTE — Assessment & Plan Note (Signed)
Systolic blood pressure 109 to 136 mmHg.   Will resume lisinopril for blood pressure control. At home she is on 20 mg will start with 10 mg for now.  Continue blood pressure monitoring.

## 2023-06-02 NOTE — Discharge Summary (Signed)
Physician Discharge Summary   Patient: Ashley Marquez MRN: [id number] DOB: 04/18/1961  Admit date:     06/01/2023  Discharge date: 06/02/23  Discharge Physician: York Ram Deionna Marcantonio   PCP: Raliegh Ip, DO   Recommendations at discharge:    Patient has been placed on diltiazem for rate control atrial fibrillation. Anticoagulation with apixaban. Discontinue lisinopril Follow up with Dr Nadine Counts in 7 to 10 days. Follow up with Cardiology a fib clinic as scheduled.   Discharge Diagnoses: Principal Problem:   Atrial fibrillation with RVR (HCC) Active Problems:   Essential hypertension   Hypokalemia  Resolved Problems:   * No resolved hospital problems. *  Hospital Course: 62 year old female with a history of hypertension and vitamin D deficiency presenting with palpitations.  Patient woke up around 2:30 AM on 06/01/2023 feeling her heart racing.  She checked her blood pressure and it was 175/101.  Initially, she resisted coming to the emergency department for further evaluation.  However, the patient relented after her daughter called EMS.  She was noted to have heart rate in the 150s by EMS. Notably, the patient had an episode of heart racing about 6 months prior to this admission.  She stated it lasted up to 10 minutes, but finally went away.  She has not had a recurrence. Patient denies fevers, chills, headache, chest pain, dyspnea, nausea, vomiting, diarrhea, abdominal pain, dysuria, hematuria, hematochezia, and melena. The patient denies any new medications.  She does not smoke or drink any alcohol.  She denies any OTC medications except for acetaminophen.  Notably, the patient has a history of hypertension for which she has been prescribed lisinopril.  She states that she does not take it appropriately. In the ED, the patient was afebrile and hemodynamically stable with oxygen saturation 97% on room air.  WBC 7.0, hemoglobin 13.9, platelets 240,000.  Sodium 138, potassium  2.7, bicarbonate 23, serum creatinine 0.71.  UA was negative for pyuria.  Chest x-ray was negative.  EKG showed atrial fibrillation with nonspecific ST changes.  TSH 6.772.  The patient was started on diltiazem drip.  Cardiology was consulted to assist with management.  Assessment and Plan: * Atrial fibrillation with RVR (HCC) Patient continue in atrial fibrillation, her rate is 90 to 100. Echocardiogram with preserved LV systolic function EF 60 to 65%, no LVH, RV systolic function is preserved, RVSP 27.8 mmHg. No significant valvular disease.   Now is off diltiazem drip and transitioned to oral diltiazem.  Plan to increase dose to 60 mg q 6 hrs, and if good rate control to transition to long acting formulation.  Chads 2 vasc score is 2, decision to start anticoagulation with apixaban.  Ok to transfer to telemetry ward.   Essential hypertension Systolic blood pressure 109 to 136 mmHg.   Will resume lisinopril for blood pressure control. At home she is on 20 mg will start with 10 mg for now.  Continue blood pressure monitoring.   Hypokalemia Renal function with serum cr at 0,70 with K at 3,8 and serum bicarbonate at 21. Na 135 and Mg 2.3   Add 40 meq kcl po. Follow up renal function and electrolytes in am.         Consultants: cardiology  Procedures performed: none   Disposition: Home Diet recommendation:  Discharge Diet Orders (From admission, onward)     Start     Ordered   06/02/23 0000  Diet - low sodium heart healthy        06/02/23  1503           Cardiac diet DISCHARGE MEDICATION: Allergies as of 06/02/2023       Reactions   Gabapentin    hallucination   Penicillins Nausea And Vomiting, Other (See Comments)   Patient slept for 2 days. Has patient had a PCN reaction causing immediate rash, facial/tongue/throat swelling, SOB or lightheadedness with hypotension: No Has patient had a PCN reaction causing severe rash involving mucus membranes or skin necrosis:  No Has patient had a PCN reaction that required hospitalization No Has patient had a PCN reaction occurring within the last 10 years: No If all of the above answers are "NO", then may proceed with Cephalosporin use.        Medication List     STOP taking these medications    fluticasone 50 MCG/ACT nasal spray Commonly known as: FLONASE   levocetirizine 5 MG tablet Commonly known as: XYZAL   lisinopril 20 MG tablet Commonly known as: ZESTRIL   loperamide 2 MG tablet Commonly known as: Imodium A-D   omeprazole 20 MG capsule Commonly known as: PRILOSEC   ondansetron 4 MG tablet Commonly known as: Zofran   Vitamin D (Ergocalciferol) 1.25 MG (50000 UNIT) Caps capsule Commonly known as: DRISDOL       TAKE these medications    albuterol 108 (90 Base) MCG/ACT inhaler Commonly known as: VENTOLIN HFA Inhale 2 puffs into the lungs every 6 (six) hours as needed for wheezing or shortness of breath.   apixaban 5 MG Tabs tablet Commonly known as: ELIQUIS Take 1 tablet (5 mg total) by mouth 2 (two) times daily.   diclofenac 75 MG EC tablet Commonly known as: VOLTAREN Take 1 tablet (75 mg total) by mouth 2 (two) times daily as needed for moderate pain.   diltiazem 180 MG 24 hr capsule Commonly known as: CARDIZEM CD Take 1 capsule (180 mg total) by mouth daily.   oxyCODONE-acetaminophen 10-325 MG tablet Commonly known as: PERCOCET Take 0.5-1 tablets by mouth every 8 (eight) hours as needed for pain.   triamcinolone cream 0.1 % Commonly known as: KENALOG Apply 1 Application topically 2 (two) times daily as needed (poison oak).        Discharge Exam: Filed Weights   06/01/23 0442 06/01/23 0703 06/02/23 0451  Weight: 67.1 kg 65.5 kg 66.8 kg   Subjective: Patient is feeling better, no chest pain or dyspnea, no edema. At home is very active   Neurology awake and alert ENT with no pallor Cardiovascular with S1 and S2 present, irregularly irregular with no gallops,  rubs or murmurs No JVD No lower extremity edema Respiratory with no rales or wheezing Abdomen with no distention   Condition at discharge: stable  The results of significant diagnostics from this hospitalization (including imaging, microbiology, ancillary and laboratory) are listed below for reference.   Imaging Studies: ECHOCARDIOGRAM COMPLETE  Result Date: 06/01/2023    ECHOCARDIOGRAM REPORT   Patient Name:   PARTICIA HORI [place] Date of Exam: 06/01/2023 Medical Rec #:  [id number]      Height:       65.0 in Accession #:    [id number]     Weight:       144.4 lb Date of Birth:  06/21/1961      BSA:          1.722 m Patient Age:    62 years       BP:           131/69  mmHg Patient Gender: F              HR:           90 bpm. [facility] Procedure: 2D Echo, Color Doppler and Cardiac Doppler Indications:    Atrial Fibrillation I48.91  History:        Patient has no prior history of Echocardiogram examinations.                 Arrythmias:Atrial Fibrillation, Signs/Symptoms:Murmur; Risk                 Factors:Hypertension and Non-Smoker.  Sonographer:    Aron Baba Referring Phys: [id number] ASIA B ZIERLE-GHOSH IMPRESSIONS  1. Left ventricular ejection fraction, by estimation, is 60 to 65%. The left ventricle has normal function. The left ventricle has no regional wall motion abnormalities. Left ventricular diastolic parameters are indeterminate.  2. Right ventricular systolic function is normal. The right ventricular size is normal. There is normal pulmonary artery systolic pressure.  3. The mitral valve is normal in structure. Mild mitral valve regurgitation. No evidence of mitral stenosis.  4. The tricuspid valve is abnormal.  5. The aortic valve is tricuspid. Aortic valve regurgitation is mild. No aortic stenosis is present.  6. The inferior vena cava is normal in size with greater than 50% respiratory variability, suggesting right atrial pressure of 3 mmHg. FINDINGS  Left Ventricle: LVOT pulse wave  Doppler was not performed. Left ventricular ejection fraction, by estimation, is 60 to 65%. The left ventricle has normal function. The left ventricle has no regional wall motion abnormalities. The left ventricular internal cavity size was normal in size. There is no left ventricular hypertrophy. Left ventricular diastolic parameters are indeterminate. Right Ventricle: The right ventricular size is normal. Right vetricular wall thickness was not well visualized. Right ventricular systolic function is normal. There is normal pulmonary artery systolic pressure. The tricuspid regurgitant velocity is 2.49 m/s, and with an assumed right atrial pressure of 3 mmHg, the estimated right ventricular systolic pressure is 27.8 mmHg. Left Atrium: Left atrial size was normal in size. Right Atrium: Right atrial size was normal in size. Pericardium: There is no evidence of pericardial effusion. Mitral Valve: The mitral valve is normal in structure. Mild mitral valve regurgitation. No evidence of mitral valve stenosis. Tricuspid Valve: The tricuspid valve is abnormal. Tricuspid valve regurgitation is mild . No evidence of tricuspid stenosis. Aortic Valve: The aortic valve is tricuspid. Aortic valve regurgitation is mild. Aortic regurgitation PHT measures 818 msec. No aortic stenosis is present. Aortic valve mean gradient measures 4.8 mmHg. Aortic valve peak gradient measures 11.4 mmHg. Pulmonic Valve: The pulmonic valve was not well visualized. Pulmonic valve regurgitation is not visualized. No evidence of pulmonic stenosis. Aorta: The aortic root is normal in size and structure. Venous: The inferior vena cava is normal in size with greater than 50% respiratory variability, suggesting right atrial pressure of 3 mmHg. IAS/Shunts: The interatrial septum was not well visualized.  LEFT VENTRICLE PLAX 2D LVIDd:         4.10 cm   Diastology LVIDs:         2.70 cm   LV e' medial:    8.94 cm/s LV PW:         0.70 cm   LV E/e' medial:  14.0  LV IVS:        0.70 cm   LV e' lateral:   11.10 cm/s LVOT diam:     1.90  cm   LV E/e' lateral: 11.3 LVOT Area:     2.84 cm  RIGHT VENTRICLE RV S prime:     10.90 cm/s TAPSE (M-mode): 1.2 cm LEFT ATRIUM             Index        RIGHT ATRIUM           Index LA diam:        3.40 cm 1.97 cm/m   RA Area:     10.90 cm LA Vol (A2C):   43.3 ml 25.14 ml/m  RA Volume:   19.80 ml  11.50 ml/m LA Vol (A4C):   20.3 ml 11.79 ml/m LA Biplane Vol: 32.6 ml 18.93 ml/m  AORTIC VALVE AV Vmax:      168.93 cm/s AV Vmean:     98.517 cm/s AV VTI:       0.260 m AV Peak Grad: 11.4 mmHg AV Mean Grad: 4.8 mmHg AI PHT:       818 msec  AORTA Ao Root diam: 3.20 cm Ao Asc diam:  2.90 cm MITRAL VALVE                TRICUSPID VALVE MV Area (PHT): 3.06 cm     TR Peak grad:   24.8 mmHg MV Decel Time: 248 msec     TR Vmax:        249.00 cm/s MV E velocity: 125.00 cm/s                             SHUNTS                             Systemic Diam: 1.90 cm Dina Rich MD Electronically signed by Dina Rich MD Signature Date/Time: 06/01/2023/10:56:38 AM    Final    DG Chest Port 1 View  Result Date: 06/01/2023 CLINICAL DATA:  Atrial fibrillation. EXAM: PORTABLE CHEST 1 VIEW COMPARISON:  12/05/2015 FINDINGS: Heart size and mediastinal contours are unremarkable. No pleural fluid, interstitial edema or airspace disease. Visualized osseous structures appear intact. IMPRESSION: Negative portable chest. Electronically Signed   By: Signa Kell M.D.   On: 06/01/2023 05:32    Microbiology: Results for orders placed or performed during the hospital encounter of 06/01/23  MRSA Next Gen by PCR, Nasal     Status: None   Collection Time: 06/01/23  7:10 AM   Specimen: Nasal Mucosa; Nasal Swab  Result Value Ref Range Status   MRSA by PCR Next Gen NOT DETECTED NOT DETECTED Final    Comment: (NOTE) The GeneXpert MRSA Assay (FDA approved for NASAL specimens only), is one component of a comprehensive MRSA colonization surveillance program. It is  not intended to diagnose MRSA infection nor to guide or monitor treatment for MRSA infections. Test performance is not FDA approved in patients less than 83 years old. Performed at [facility], [address] [place], [place] [address]     Labs: CBC: Recent Labs  Lab 06/01/23 0447 06/02/23 0506  WBC 7.0 8.2  HGB 13.9 13.5  HCT 41.3 41.1  MCV 93.7 95.6  PLT 314 329   Basic Metabolic Panel: Recent Labs  Lab 06/01/23 0447 06/02/23 0506  NA 138 135  K 2.7* 3.8  CL 104 106  CO2 23 21*  GLUCOSE 126* 109*  BUN 16 13  CREATININE 0.71 0.70  CALCIUM 9.2 8.9  MG 2.2  2.3   Liver Function Tests: No results for input(s): "AST", "ALT", "ALKPHOS", "BILITOT", "PROT", "ALBUMIN" in the last 168 hours. CBG: No results for input(s): "GLUCAP" in the last 168 hours.  Discharge time spent: greater than 30 minutes.  Signed: Coralie Keens, MD [facility] 06/02/2023

## 2023-06-02 NOTE — Assessment & Plan Note (Signed)
Renal function with serum cr at 0,70 with K at 3,8 and serum bicarbonate at 21. Na 135 and Mg 2.3   Add 40 meq kcl po. Follow up renal function and electrolytes in am.

## 2023-06-04 ENCOUNTER — Telehealth: Payer: Self-pay | Admitting: *Deleted

## 2023-06-04 ENCOUNTER — Encounter: Payer: Self-pay | Admitting: *Deleted

## 2023-06-04 NOTE — Transitions of Care (Post Inpatient/ED Visit) (Signed)
06/04/2023  Name: Ashley Marquez MRN: [id number] DOB: [birth year]  Today's TOC FU Call Status: Today's TOC FU Call Status:: Successful TOC FU Call Competed TOC FU Call Complete Date: 06/04/23  Transition Care Management Follow-up Telephone Call Date of Discharge: 06/02/23 [facility] Type of Discharge: Inpatient Admission Primary Inpatient Discharge Diagnosis:: New onset A-Fib with RVR How have you been since you were released from the hospital?: Better ("I am feeling much better; not having any problems.  Got the 2 new medications, taking as instructed.  I still work and am planning to go back to work on Monday 06/07/23.  All of this is new to me") Any questions or concerns?: No  Items Reviewed: Did you receive and understand the discharge instructions provided?: Yes (thoroughly reviewed with patient who verbalizes good understanding of same) Medications obtained,verified, and reconciled?: Yes (Medications Reviewed) (Full medication reconciliation/ review completed; no concerns or discrepancies identified; confirmed patient obtained/ is taking all newly Rx'd medications as instructed; self-manages medications and denies questions/ concerns around medications today) Any new allergies since your discharge?: No Dietary orders reviewed?: Yes Type of Diet Ordered:: No caffeine; low salt Do you have support at home?: Yes People in Home: parent(s) Name of Support/Comfort Primary Source: Reports resides with her mother; completely independent in self-care activities; supportive local daughter assists as/ if needed/ indicated  Medications Reviewed Today: Medications Reviewed Today     Reviewed by Michaela Corner, RN (Registered Nurse) on 06/04/23 at 1255  Med List Status: <None>   Medication Order Taking? Sig Documenting Provider Last Dose Status Informant  albuterol (VENTOLIN HFA) 108 (90 Base) MCG/ACT inhaler [id number] Yes Inhale 2 puffs into the lungs every 6 (six)  hours as needed for wheezing or shortness of breath. Daryll Drown, NP Taking Active Self  apixaban (ELIQUIS) 5 MG TABS tablet [id number] Yes Take 1 tablet (5 mg total) by mouth 2 (two) times daily. Arrien, York Ram, MD Taking Active   diclofenac (VOLTAREN) 75 MG EC tablet [id number] Yes Take 1 tablet (75 mg total) by mouth 2 (two) times daily as needed for moderate pain. Delynn Flavin M, DO Taking Active Self  diltiazem (CARDIZEM CD) 180 MG 24 hr capsule [id number] Yes Take 1 capsule (180 mg total) by mouth daily. Arrien, York Ram, MD Taking Active   oxyCODONE-acetaminophen [facility]) 10-325 MG tablet [id number] Yes Take 0.5-1 tablets by mouth every 8 (eight) hours as needed for pain. Delynn Flavin M, DO Taking Active Self  triamcinolone cream (KENALOG) 0.1 % [id number] Yes Apply 1 Application topically 2 (two) times daily as needed (poison oak). Raliegh Ip, DO Taking Active Self            Home Care and Equipment/Supplies: Were Home Health Services Ordered?: No Any new equipment or medical supplies ordered?: No  Functional Questionnaire: Do you need assistance with bathing/showering or dressing?: No Do you need assistance with meal preparation?: No Do you need assistance with eating?: No Do you have difficulty maintaining continence: No Do you need assistance with getting out of bed/getting out of a chair/moving?: No Do you have difficulty managing or taking your medications?: No  Follow up appointments reviewed: PCP Follow-up appointment confirmed?: Yes (care coordination outreach in real-time with scheduling care guide to successfully schedule hospital follow up PCP appointment 06/09/23) Date of PCP follow-up appointment?: 06/09/23 Follow-up Provider: PCP, Dr. Nadine Counts Specialist [facility] Follow-up appointment confirmed?: Yes Date of Specialist follow-up appointment?: 06/18/23 Follow-Up Specialty Provider:: cardiology provider Do you need  transportation to your follow-up appointment?: No Do you understand care options if your condition(s) worsen?: Yes-patient verbalized understanding  SDOH Interventions Today    Flowsheet Row Most Recent Value  SDOH Interventions   Food Insecurity Interventions Intervention Not Indicated  Transportation Interventions Intervention Not Indicated  [drives self]      TOC Interventions Today    Flowsheet Row Most Recent Value  TOC Interventions   TOC Interventions Discussed/Reviewed TOC Interventions Discussed, Arranged PCP follow up within 7 days/Care Guide scheduled  [provided my direct contact information should questions/ concerns/ needs arise post-TOC call, prior to RN CM telephone visit 06/22/23]      Interventions Today    Flowsheet Row Most Recent Value  Chronic Disease   Chronic disease during today's visit Atrial Fibrillation (AFib)  General Interventions   General Interventions Discussed/Reviewed General Interventions Discussed, Durable Medical Equipment (DME), Communication with, Referral to Nurse, Doctor Visits  Doctor Visits Discussed/Reviewed Doctor Visits Discussed  Durable Medical Equipment (DME) Other  [confirmed not currently requiring/ using assistive devices]  Communication with RN  Education Interventions   Education Provided Provided Education  Provided Verbal Education On Other, Medication  [purpose of ACT in setting of new onset AF,  signs/ symptoms and corresponding action plan for A-Fib]  Nutrition Interventions   Nutrition Discussed/Reviewed Nutrition Discussed, Decreasing salt  [decreasing caffeine]  Pharmacy Interventions   Pharmacy Dicussed/Reviewed Pharmacy Topics Discussed  [Full medication review with updating medication list in EHR per patient report]  Safety Interventions   Safety Discussed/Reviewed Safety Discussed      Caryl Pina, RN, BSN, CCRN Alumnus RN CM Care Coordination/ Transition of Care- [facility] Care Management [phone]:  direct office

## 2023-06-08 ENCOUNTER — Telehealth: Payer: Self-pay

## 2023-06-08 NOTE — Telephone Encounter (Signed)
Chart review completed for patient. Patient is due for screening mammogram. Mychart message sent to patient to inquire about scheduling mammogram.  Daylon Lafavor, Population Health Specialist.  

## 2023-06-09 ENCOUNTER — Ambulatory Visit (INDEPENDENT_AMBULATORY_CARE_PROVIDER_SITE_OTHER): Payer: Medicaid Other | Admitting: Family Medicine

## 2023-06-09 ENCOUNTER — Encounter: Payer: Self-pay | Admitting: Family Medicine

## 2023-06-09 VITALS — BP 137/77 | HR 77 | Temp 98.2°F | Ht 65.0 in | Wt 148.0 lb

## 2023-06-09 DIAGNOSIS — G8929 Other chronic pain: Secondary | ICD-10-CM

## 2023-06-09 DIAGNOSIS — M1732 Unilateral post-traumatic osteoarthritis, left knee: Secondary | ICD-10-CM

## 2023-06-09 DIAGNOSIS — I4891 Unspecified atrial fibrillation: Secondary | ICD-10-CM | POA: Diagnosis not present

## 2023-06-09 DIAGNOSIS — Z09 Encounter for follow-up examination after completed treatment for conditions other than malignant neoplasm: Secondary | ICD-10-CM | POA: Diagnosis not present

## 2023-06-09 DIAGNOSIS — M545 Low back pain, unspecified: Secondary | ICD-10-CM

## 2023-06-09 DIAGNOSIS — M5136 Other intervertebral disc degeneration, lumbar region: Secondary | ICD-10-CM | POA: Diagnosis not present

## 2023-06-09 MED ORDER — OXYCODONE-ACETAMINOPHEN 10-325 MG PO TABS
0.5000 | ORAL_TABLET | Freq: Three times a day (TID) | ORAL | 0 refills | Status: DC | PRN
Start: 2023-06-29 — End: 2023-09-13

## 2023-06-09 MED ORDER — OXYCODONE-ACETAMINOPHEN 10-325 MG PO TABS
1.0000 | ORAL_TABLET | Freq: Three times a day (TID) | ORAL | 0 refills | Status: DC | PRN
Start: 2023-08-29 — End: 2023-09-13

## 2023-06-09 MED ORDER — APIXABAN 5 MG PO TABS
5.0000 mg | ORAL_TABLET | Freq: Two times a day (BID) | ORAL | 3 refills | Status: DC
Start: 2023-06-09 — End: 2023-12-15

## 2023-06-09 MED ORDER — DILTIAZEM HCL ER COATED BEADS 180 MG PO CP24
180.0000 mg | ORAL_CAPSULE | Freq: Every day | ORAL | 3 refills | Status: DC
Start: 2023-06-09 — End: 2023-12-15

## 2023-06-09 MED ORDER — OXYCODONE-ACETAMINOPHEN 10-325 MG PO TABS
0.5000 | ORAL_TABLET | Freq: Three times a day (TID) | ORAL | 0 refills | Status: DC | PRN
Start: 2023-07-29 — End: 2023-09-13

## 2023-06-09 NOTE — Progress Notes (Signed)
Subjective: Ashley Marquez follow up PCP: Ashley Ip, DO WGN:[id number] Ashley Marquez is a 62 y.o. female presenting to clinic today for:  1. New onset afib Started on cardizem 180mg  and eliquis 5mg . Has appt with Tonna Corner, [facility]- cardiology, in 1.5 week.  She reports compliance with medications.  Denies any bleeding.  She feels the energy is improving.  Denies any lower extremity edema, heart palpitations, dizziness.  Tolerating medications without difficulty.  She is not taking diclofenac  2.  Chronic pain due to DDD Patient is compliant with Percocet 10 mg up to 3 times daily as needed.  Is supposed to have an appointment with me in about 3 weeks.  She reports no concerning symptoms with this medication including constipation, dizziness, excessive fatigue or falls.  She is tolerating work activities without difficulty   ROS: Per HPI  Allergies  Allergen Reactions   Gabapentin     hallucination   Penicillins Nausea And Vomiting and Other (See Comments)    Patient slept for 2 days.  Has patient had a PCN reaction causing immediate rash, facial/tongue/throat swelling, SOB or lightheadedness with hypotension: No Has patient had a PCN reaction causing severe rash involving mucus membranes or skin necrosis: No Has patient had a PCN reaction that required hospitalization No Has patient had a PCN reaction occurring within the last 10 years: No If all of the above answers are "NO", then may proceed with Cephalosporin use.    Past Medical History:  Diagnosis Date   Anxiety    Heart murmur     Current Outpatient Medications:    albuterol (VENTOLIN HFA) 108 (90 Base) MCG/ACT inhaler, Inhale 2 puffs into the lungs every 6 (six) hours as needed for wheezing or shortness of breath., Disp: 8 g, Rfl: 0   apixaban (ELIQUIS) 5 MG TABS tablet, Take 1 tablet (5 mg total) by mouth 2 (two) times daily., Disp: 60 tablet, Rfl: 0   diltiazem (CARDIZEM CD) 180 MG 24 hr capsule, Take 1 capsule (180  mg total) by mouth daily., Disp: 30 capsule, Rfl: 0   oxyCODONE-acetaminophen (PERCOCET) 10-325 MG tablet, Take 0.5-1 tablets by mouth every 8 (eight) hours as needed for pain., Disp: 75 tablet, Rfl: 0   triamcinolone cream (KENALOG) 0.1 %, Apply 1 Application topically 2 (two) times daily as needed (poison oak)., Disp: 80 g, Rfl: 0 Social History   Socioeconomic History   Marital status: Divorced    Spouse name: Not on file   Number of children: 2   Years of education: Not on file   Highest education level: Not on file  Occupational History    Comment: [employer]  Tobacco Use   Smoking status: Never   Smokeless tobacco: Never  Vaping Use   Vaping Use: Never used  Substance and Sexual Activity   Alcohol use: No   Drug use: No   Sexual activity: Not Currently  Other Topics Concern   Not on file  Social History Narrative   Not on file   Social Determinants of Health   Financial Resource Strain: Not on file  Food Insecurity: No Food Insecurity (06/04/2023)   Hunger Vital Sign    Worried About Running Out of Food in the Last Year: Never true    Ran Out of Food in the Last Year: Never true  Transportation Needs: No Transportation Needs (06/04/2023)   PRAPARE - Administrator, Civil Service (Medical): No    Lack of Transportation (Non-Medical): No  Physical  Activity: Not on file  Stress: Not on file  Social Connections: Not on file  Intimate Partner Violence: Not At Risk (06/01/2023)   Humiliation, Afraid, Rape, and Kick questionnaire    Fear of Current or Ex-Partner: No    Emotionally Abused: No    Physically Abused: No    Sexually Abused: No   Family History  Problem Relation Age of Onset   Deep vein thrombosis Mother    Emphysema Father 20    Objective: Office vital signs reviewed. BP 137/77   Pulse 77   Temp 98.2 F (36.8 C) (Temporal)   Ht 5\' 5"  (1.651 m)   Wt 148 lb (67.1 kg)   SpO2 98%   BMI 24.63 kg/m   Physical Examination:  General: Awake,  alert, well nourished, No acute distress HEENT: sclera Patient, MMM Cardio: regular rate and rhythm, S1S2 heard, no murmurs appreciated Pulm: clear to auscultation bilaterally, no wheezes, rhonchi or rales; normal work of breathing on room air MSK: Ambulating independently with normal gait and station.  Assessment/ Plan: 62 y.o. female   New onset atrial fibrillation (HCC) - Plan: diltiazem (CARDIZEM CD) 180 MG 24 hr capsule, apixaban (ELIQUIS) 5 MG TABS tablet, Basic Metabolic Panel  Hospital discharge follow-up  DDD (degenerative disc disease), lumbar - Plan: oxyCODONE-acetaminophen (PERCOCET) 10-325 MG tablet, oxyCODONE-acetaminophen (PERCOCET) 10-325 MG tablet, oxyCODONE-acetaminophen (PERCOCET) 10-325 MG tablet  Post-traumatic osteoarthritis of left knee - Plan: oxyCODONE-acetaminophen (PERCOCET) 10-325 MG tablet, oxyCODONE-acetaminophen (PERCOCET) 10-325 MG tablet, oxyCODONE-acetaminophen (PERCOCET) 10-325 MG tablet  Chronic right-sided low back pain without sciatica - Plan: oxyCODONE-acetaminophen (PERCOCET) 10-325 MG tablet, oxyCODONE-acetaminophen (PERCOCET) 10-325 MG tablet, oxyCODONE-acetaminophen (PERCOCET) 10-325 MG tablet  Seems to be both rate and rhythm controlled on exam.  I have renewed her Cardizem and Eliquis.  Keep appoint with cardiology as scheduled next week.  Educated on avoidance of NSAIDs given use of anticoagulant.  Uncertain as to why she became so hypokalemic as there does not seem to be an apparent cause.  She was on an ACE inhibitor which typically is potassium sparing so it is quite a conundrum.  I did give her handout on potassium rich foods and ways to get this through dietary sources.  We will recheck her potassium level today  Percocet has been renewed.  UDS and CSA are up-to-date.  National narcotic database reviewed and there were no red flags.  These Rx's have been postdated and she will move her August 1 appointment to sometime in mid October.  No orders  of the defined types were placed in this encounter.  No orders of the defined types were placed in this encounter.    Ashley Ip, DO [facility] [phone]

## 2023-06-10 LAB — BASIC METABOLIC PANEL
BUN/Creatinine Ratio: 23 (ref 12–28)
BUN: 17 mg/dL (ref 8–27)
CO2: 20 mmol/L (ref 20–29)
Calcium: 9.3 mg/dL (ref 8.7–10.3)
Chloride: 103 mmol/L (ref 96–106)
Creatinine, Ser: 0.74 mg/dL (ref 0.57–1.00)
Glucose: 158 mg/dL — ABNORMAL HIGH (ref 70–99)
Potassium: 3.6 mmol/L (ref 3.5–5.2)
Sodium: 141 mmol/L (ref 134–144)
eGFR: 92 mL/min/{1.73_m2} (ref >59)

## 2023-06-18 ENCOUNTER — Ambulatory Visit: Payer: Medicaid Other | Attending: Student | Admitting: Student

## 2023-06-18 ENCOUNTER — Encounter: Payer: Self-pay | Admitting: Student

## 2023-06-18 VITALS — BP 126/72 | HR 72 | Ht 65.0 in | Wt 149.2 lb

## 2023-06-18 DIAGNOSIS — E876 Hypokalemia: Secondary | ICD-10-CM

## 2023-06-18 DIAGNOSIS — I10 Essential (primary) hypertension: Secondary | ICD-10-CM | POA: Diagnosis not present

## 2023-06-18 DIAGNOSIS — Z7901 Long term (current) use of anticoagulants: Secondary | ICD-10-CM | POA: Diagnosis not present

## 2023-06-18 DIAGNOSIS — I4819 Other persistent atrial fibrillation: Secondary | ICD-10-CM | POA: Diagnosis not present

## 2023-06-18 NOTE — Progress Notes (Signed)
Cardiology Office Note    Date:  06/18/2023  ID:  Ashley Marquez, DOB 1961/10/30, MRN [id number] Cardiologist: Dina Rich, MD    History of Present Illness:    Ashley Marquez is a 62 y.o. female with past medical history of HTN, anxiety, GERD and newly diagnosed atrial fibrillation who presents to the office today for hospital follow-up.   She presented to [facility] ED on 06/01/2023 after developing palpitations which awoke her from sleep. She was found to be in atrial fibrillation with RVR on admission and was started on IV Cardizem. Was also hypokalemic with K+ at 2.7 and this was replaced. TSH was elevated at 6.772 but follow-up thyroid studies were normal. Echocardiogram showed a preserved EF of 60-65% with no regional WMA and only mild MR and mild AI. Rates improved with Cardizem and she was transitioned to Cardizem CD 180mg  daily at discharge. Was started on Eliquis for anticoagulation given a IIa recommendation and in case she required a DCCV in the future. She did have follow-up labs with her PCP on 06/09/2023 and K+ was normal at 3.6.  In talking with the patient today, she reports overall feeling back to normal since her hospitalization. She denies any recent chest pain or palpitations. No recent dyspnea on exertion, orthopnea, PND or pitting edema. She has remained on Eliquis for anticoagulation with no reports of active bleeding.   Studies Reviewed:   EKG: EKG is ordered today and demonstrates:   EKG Interpretation Date/Time:  Friday June 18 2023 15:31:35 EDT Ventricular Rate:  77 PR Interval:  138 QRS Duration:  74 QT Interval:  386 QTC Calculation: 436 R Axis:   42  Text Interpretation: Normal sinus rhythm No acute changes Confirmed by Randall An ([id number]) on 06/18/2023 4:02:59 PM       Echocardiogram: 06/01/2023 IMPRESSIONS     1. Left ventricular ejection fraction, by estimation, is 60 to 65%. The  left ventricle has normal function. The left ventricle  has no regional  wall motion abnormalities. Left ventricular diastolic parameters are  indeterminate.   2. Right ventricular systolic function is normal. The right ventricular  size is normal. There is normal pulmonary artery systolic pressure.   3. The mitral valve is normal in structure. Mild mitral valve  regurgitation. No evidence of mitral stenosis.   4. The tricuspid valve is abnormal.   5. The aortic valve is tricuspid. Aortic valve regurgitation is mild. No  aortic stenosis is present.   6. The inferior vena cava is normal in size with greater than 50%  respiratory variability, suggesting right atrial pressure of 3 mmHg.   Risk Assessment/Calculations:    CHA2DS2-VASc Score = 2   This indicates a 2.2% annual risk of stroke. The patient's score is based upon: CHF History: 0 HTN History: 1 Diabetes History: 0 Stroke History: 0 Vascular Disease History: 0 Age Score: 0 Gender Score: 1    Physical Exam:   VS:  BP 126/72   Pulse 72   Ht 5\' 5"  (1.651 m)   Wt 149 lb 3.2 oz (67.7 kg)   SpO2 98%   BMI 24.83 kg/m    Wt Readings from Last 3 Encounters:  06/18/23 149 lb 3.2 oz (67.7 kg)  06/09/23 148 lb (67.1 kg)  06/02/23 147 lb 4.3 oz (66.8 kg)     GEN: Well nourished, well developed female appearing in no acute distress NECK: No JVD; No carotid bruits CARDIAC: RRR, no murmurs, rubs, gallops RESPIRATORY:  Clear  to auscultation without rales, wheezing or rhonchi  ABDOMEN: Appears non-distended. No obvious abdominal masses. EXTREMITIES: No clubbing or cyanosis. No pitting edema.  Distal pedal pulses are 2+ bilaterally.   Assessment and Plan:   1. Persistent Atrial Fibrillation/Use of Long-term Anticoagulation - She denies any recent palpitations and is in NSR by examination and EKG today. Will continue Cardizem CD 180mg  daily for HR and BP control (was previously not taking her BP medications regularly prior to admission due to them making her feel poorly but has  tolerated Cardizem CD).  - No reports of active bleeding. We reviewed risks and benefits of anticoagulation and she will continue Eliquis 5mg  BID for now.   2. HTN - BP is well-controlled at 136/72 during today's visit. Continue Cardizem CD 180mg  daily.   3. Hypokalemia - Now resolved. K+ had normalized to 3.6 by most recent labs on 06/09/2023.  Signed, Ellsworth Lennox, PA-C

## 2023-06-18 NOTE — Patient Instructions (Signed)
Medication Instructions:  Your physician recommends that you continue on your current medications as directed. Please refer to the Current Medication list given to you today.  *If you need a refill on your cardiac medications before your next appointment, please call your pharmacy*   Lab Work: None   If you have labs (blood work) drawn today and your tests are completely normal, you will receive your results only by: MyChart Message (if you have MyChart) OR A paper copy in the mail If you have any lab test that is abnormal or we need to change your treatment, we will call you to review the results.   Testing/Procedures: NONE      Follow-Up: At [facility], you and your health needs are our priority.  As part of our continuing mission to provide you with exceptional heart care, we have created designated Provider Care Teams.  These Care Teams include your primary Cardiologist (physician) and Advanced Practice Providers (APPs -  Physician Assistants and Nurse Practitioners) who all work together to provide you with the care you need, when you need it.  We recommend signing up for the patient portal called "MyChart".  Sign up information is provided on this After Visit Summary.  MyChart is used to connect with patients for Virtual Visits (Telemedicine).  Patients are able to view lab/test results, encounter notes, upcoming appointments, etc.  Non-urgent messages can be sent to your provider as well.   To learn more about what you can do with MyChart, go to [email or web address].    Your next appointment:   6 month(s)  Provider:   You may see Dina Rich, MD or one of the following Advanced Practice Providers on your designated Care Team:   Randall An, PA-C  Jacolyn Reedy, [place]     Other Instructions Thank you for choosing [facility]!

## 2023-06-21 ENCOUNTER — Encounter: Payer: Self-pay | Admitting: *Deleted

## 2023-06-21 ENCOUNTER — Ambulatory Visit: Payer: Self-pay | Admitting: *Deleted

## 2023-06-21 NOTE — Patient Instructions (Addendum)
Visit Information  Sorry to have missed you for the telephone visit with me today. Please don't hesitate to contact me if I can be of assistance to you.   Following are the goals today:   Goals Addressed             This Visit's Progress    manage new onset of atrial fibrillation at home- [facility] care coordination services       Interventions Today    Flowsheet Row Most Recent Value  Chronic Disease   Chronic disease during today's visit Atrial Fibrillation (AFib)  Education Interventions   Education Provided Provided Web-based Education  [on web base atrial fibrillation]              Our next appointment is by telephone on 06/24/23 at 2:45 pm   Please call the care guide team at [phone] if you need to cancel or reschedule your appointment.   If you are experiencing a Mental Health or Behavioral Health Crisis or need someone to talk to, please call the Suicide and Crisis Lifeline: 988 call the [place] National Suicide Prevention Lifeline: (313)465-7948 or TTY: (201) 126-0345 TTY 617 154 8234) to talk to a trained counselor call 1-800-273-TALK (toll free, 24 hour hotline) call the [facility]: 816-309-2472 call 911   Patient verbalizes understanding of instructions and care plan provided today and agrees to view in MyChart. Active MyChart status and patient understanding of how to access instructions and care plan via MyChart confirmed with patient.     The patient has been provided with contact information for the care management team and has been advised to call with any health related questions or concerns.   Lorenza Shakir L. Noelle Penner, RN, BSN, CCM [facility] Care Management Community Coordinator Office number [phone]

## 2023-06-21 NOTE — Patient Outreach (Addendum)
  Care Coordination   06/21/2023 Name: Ashley Marquez MRN: [id number] DOB: [birth year]   Care Coordination Outreach Attempts:  An unsuccessful telephone outreach was attempted for a scheduled appointment today.  Follow Up Plan:  Additional outreach attempts will be made to offer the patient care coordination information and services.   Encounter Outcome:  Pt. Visit Completed   Care Coordination Interventions:  Yes, provided Interventions Today    Flowsheet Row Most Recent Value  Chronic Disease   Chronic disease during today's visit Atrial Fibrillation (AFib)  Education Interventions   Education Provided Provided Web-based Education  [on web base atrial fibrillation]          Samay Delcarlo L. Noelle Penner, RN, BSN, CCM [facility] Care Management Community Coordinator Office number [phone]

## 2023-06-22 ENCOUNTER — Encounter: Payer: Medicaid Other | Admitting: *Deleted

## 2023-06-24 ENCOUNTER — Ambulatory Visit: Payer: Self-pay | Admitting: *Deleted

## 2023-06-24 NOTE — Patient Outreach (Addendum)
  Care Coordination   06/24/2023 Name: Ashley Marquez MRN: [id number] DOB: 1961/04/22   Care Coordination Outreach Attempts:  A second unsuccessful outreach was attempted today to offer the patient with information about available care coordination services.  Follow Up Plan:  Additional outreach attempts will be made to offer the patient care coordination information and services.   Encounter Outcome:  No Answer   Care Coordination Interventions:  No, not indicated    Jinger Middlesworth L. Noelle Penner, RN, BSN, CCM [facility] Care Management Community Coordinator Office number [phone]

## 2023-06-28 ENCOUNTER — Encounter: Payer: Self-pay | Admitting: Family Medicine

## 2023-07-02 ENCOUNTER — Ambulatory Visit: Payer: Medicaid Other | Admitting: Family Medicine

## 2023-07-06 ENCOUNTER — Telehealth: Payer: Self-pay | Admitting: *Deleted

## 2023-07-06 NOTE — Progress Notes (Unsigned)
  Care Coordination Note  07/06/2023 Name: Ashley Marquez MRN: [id number] DOB: [birth year]  Ashley Marquez is a 62 y.o. year old female who is a primary care patient of Raliegh Ip, DO and is actively engaged with the care management team. I reached out to Dub Mikes by phone today to assist with re-scheduling a follow up visit with the RN Case Manager  Follow up plan: Unsuccessful telephone outreach attempt made. A HIPAA compliant phone message was left for the patient providing contact information and requesting a return call.   [facility]  Care Coordination Care Guide  Direct Dial: [phone]

## 2023-07-08 ENCOUNTER — Telehealth: Payer: Self-pay

## 2023-07-08 ENCOUNTER — Ambulatory Visit: Payer: Self-pay | Admitting: *Deleted

## 2023-07-08 NOTE — Progress Notes (Signed)
Patient has Managed Medicaid, L

## 2023-07-08 NOTE — Telephone Encounter (Signed)
..   Medicaid Managed Care   Unsuccessful Outreach Note  07/08/2023 Name: Ashley Marquez MRN: [id number] DOB: [birth year]  Referred by: Raliegh Ip, DO Reason for referral : Appointment   An unsuccessful telephone outreach was attempted today. The patient was referred to the case management team for assistance with care management and care coordination.   Follow Up Plan: A HIPAA compliant phone message was left for the patient providing contact information and requesting a return call.  The care management team will reach out to the patient again over the next 7 days.     Weston Settle Care Guide  [facility] Managed  Care Guide [facility]  [phone]

## 2023-07-26 ENCOUNTER — Encounter: Payer: Self-pay | Admitting: Nurse Practitioner

## 2023-07-26 ENCOUNTER — Telehealth: Payer: Self-pay

## 2023-07-26 ENCOUNTER — Ambulatory Visit: Payer: Medicaid Other | Admitting: Nurse Practitioner

## 2023-07-26 ENCOUNTER — Other Ambulatory Visit (HOSPITAL_COMMUNITY): Payer: Self-pay

## 2023-07-26 VITALS — BP 146/79 | HR 75 | Temp 97.5°F | Resp 20 | Ht 65.0 in | Wt 146.0 lb

## 2023-07-26 DIAGNOSIS — M629 Disorder of muscle, unspecified: Secondary | ICD-10-CM

## 2023-07-26 MED ORDER — CYCLOBENZAPRINE HCL 5 MG PO TABS
5.0000 mg | ORAL_TABLET | Freq: Three times a day (TID) | ORAL | 1 refills | Status: DC | PRN
Start: 2023-07-26 — End: 2023-12-20

## 2023-07-26 NOTE — Telephone Encounter (Signed)
Received call from [facility]. Patient needs a PA for her oxycodone

## 2023-07-26 NOTE — Telephone Encounter (Signed)
PA request has been Submitted. New Encounter created for follow up. For additional info see Pharmacy Prior Auth telephone encounter from 07/26/23.

## 2023-07-26 NOTE — Progress Notes (Signed)
   Subjective:    Patient ID: ABEGAIL Marquez, female    DOB: [birth year], 62 y.o.   MRN: [id number]   Chief Complaint: Knot on right lower leg (Painful at times/)   HPI  Patient comes in c/o knot right lower leg- sore to touch at times. She got a cramp in her leg yesterday and that is when she noticed it.  She is on eliquis for afib Patient Active Problem List   Diagnosis Date Noted   Atrial fibrillation with RVR (HCC) 06/01/2023   Essential hypertension 06/01/2023   Hypokalemia 06/01/2023   DDD (degenerative disc disease), lumbar 03/20/2019   Gastroesophageal reflux disease without esophagitis 03/06/2019   Ganglion cyst 09/16/2018   Depression, major, single episode, moderate (HCC) 03/15/2018   Post-traumatic osteoarthritis of left knee 09/29/2017   Chronic pain of left knee 07/05/2017   Chronic right-sided low back pain without sciatica 09/15/2016   Generalized anxiety disorder 09/15/2016   Elevated BP without diagnosis of hypertension 09/15/2016   Chest pain 06/08/2011   Anxiety 06/08/2011       Review of Systems  Constitutional:  Negative for diaphoresis.  Eyes:  Negative for pain.  Respiratory:  Negative for shortness of breath.   Cardiovascular:  Negative for chest pain, palpitations and leg swelling.  Gastrointestinal:  Negative for abdominal pain.  Endocrine: Negative for polydipsia.  Skin:  Negative for rash.  Neurological:  Negative for dizziness, weakness and headaches.  Hematological:  Does not bruise/bleed easily.  All other systems reviewed and are negative.      Objective:   Physical Exam Constitutional:      Appearance: Normal appearance.  Cardiovascular:     Rate and Rhythm: Normal rate and regular rhythm.     Heart sounds: Normal heart sounds.  Pulmonary:     Effort: Pulmonary effort is normal.     Breath sounds: Normal breath sounds.  Musculoskeletal:     Comments: Small tender superficial nodule lateral side of left lower leg just below knee  joint.  Skin:    General: Skin is warm.  Neurological:     General: No focal deficit present.     Mental Status: She is alert and oriented to person, place, and time.  Psychiatric:        Mood and Affect: Mood normal.        Behavior: Behavior normal.    BP (!) 146/79   Pulse 75   Temp (!) 97.5 F (36.4 C) (Temporal)   Resp 20   Ht 5\' 5"  (1.651 m)   Wt 146 lb (66.2 kg)   SpO2 96%   BMI 24.30 kg/m       Assessment & Plan:  Ashley Marquez in today with chief complaint of Knot on right lower leg (Painful at times/)   1. Nodule in muscle Left lower leg Ice bid Message area RTO prn - cyclobenzaprine (FLEXERIL) 5 MG tablet; Take 1 tablet (5 mg total) by mouth 3 (three) times daily as needed for muscle spasms.  Dispense: 30 tablet; Refill: 1    The above assessment and management plan was discussed with the patient. The patient verbalized understanding of and has agreed to the management plan. Patient is aware to call the clinic if symptoms persist or worsen. Patient is aware when to return to the clinic for a follow-up visit. Patient educated on when it is appropriate to go to the emergency department.   Mary-Margaret Daphine Deutscher, FNP

## 2023-07-26 NOTE — Telephone Encounter (Signed)
Pharmacy Patient Advocate Encounter   Received notification from Pt Calls Messages that prior authorization for oxyCODONE-Acetaminophen 10-325MG  tablets is required/requested.   Insurance verification completed.   The patient is insured through [facility] .   Per test claim: PA required; PA submitted to [facility] via CoverMyMeds Key/confirmation #/EOC [id number] Status is pending

## 2023-07-27 ENCOUNTER — Other Ambulatory Visit (HOSPITAL_COMMUNITY): Payer: Self-pay

## 2023-07-27 ENCOUNTER — Ambulatory Visit: Payer: Medicaid Other | Admitting: Family Medicine

## 2023-07-27 NOTE — Telephone Encounter (Signed)
Pharmacy Patient Advocate Encounter  Received notification from [facility] that Prior Authorization for oxyCODONE-Acetaminophen 10-325MG  tablets has been APPROVED from 07/26/23 to 01/26/24   PA #/Case ID/Reference #:  [id number]

## 2023-08-03 ENCOUNTER — Telehealth: Payer: Self-pay

## 2023-08-03 NOTE — Telephone Encounter (Signed)
..   Medicaid Managed Care   Unsuccessful Outreach Note  08/03/2023 Name: Ashley Marquez MRN: [id number] DOB: Mar 25, 1961  Referred by: Raliegh Ip, DO Reason for referral : Appointment (I called the patient today to get her scheduled with the MM RNCM. Patient states that now is not a good time for her to talk due to her mother falling and breaking an arm. We agreed that I would reach out at the end of the next week.)     Weston Settle Care Guide  [facility] Guide [facility]  [phone]

## 2023-08-23 ENCOUNTER — Other Ambulatory Visit: Payer: Medicaid Other | Admitting: *Deleted

## 2023-08-23 NOTE — Patient Outreach (Signed)
Care Coordination  08/23/2023  Ashley Marquez 1961-03-14 [id number]   Successful telephone outreach with Ms. Pineiro. However, she is working extra this week and request to reschedule for next week. A new telephone appointment was scheduled for 09/01/23 at 3:45pm to accommodate patients work schedule. Patient agreed to new date and time.  Estanislado Emms RN, BSN [place]  Value-Based Care [facility] RN Care Coordinator [phone]

## 2023-09-01 ENCOUNTER — Other Ambulatory Visit: Payer: Medicaid Other | Admitting: *Deleted

## 2023-09-01 NOTE — Patient Outreach (Signed)
  Medicaid Managed Care   Unsuccessful Attempt Note   09/01/2023 Name: Ashley Marquez MRN: [id number] DOB: October 31, 1961  Referred by: Raliegh Ip, DO Reason for referral : High Risk Managed Medicaid (Unsuccessful RNCM initial telephone outreach)   An unsuccessful telephone outreach was attempted today. The patient was referred to the case management team for assistance with care management and care coordination.    Follow Up Plan: A HIPAA compliant phone message was left for the patient providing contact information and requesting a return call. and The Managed Medicaid care management team will reach out to the patient again over the next 7 days.    Estanislado Emms RN, BSN [place]  Value-Based Care Institute [facility] Health RN Care Coordinator [phone]

## 2023-09-01 NOTE — Patient Instructions (Signed)
Visit Information  Ms. Ashley Marquez  - as a part of your Medicaid benefit, you are eligible for care management and care coordination services at no cost or copay. I was unable to reach you by phone today but would be happy to help you with your health related needs. Please feel free to call me @ [phone].   A member of the Managed Medicaid care management team will reach out to you again over the next 7 days.   Estanislado Emms RN, BSN [place]  Value-Based Care Institute [facility] Health RN Care Coordinator [phone]

## 2023-09-09 ENCOUNTER — Telehealth: Payer: Self-pay

## 2023-09-09 NOTE — Telephone Encounter (Signed)
..   Medicaid Managed Care   Unsuccessful Outreach Note  09/09/2023 Name: Ashley Marquez MRN: [id number] DOB: August 10, 1961  Referred by: Raliegh Ip, DO Reason for referral : Appointment   A second unsuccessful telephone outreach was attempted today. The patient was referred to the case management team for assistance with care management and care coordination.   Follow Up Plan: A HIPAA compliant phone message was left for the patient providing contact information and requesting a return call.  The care management team will reach out to the patient again over the next 7 days.   Weston Settle Care Guide  [facility] Managed  Care Guide [facility]  [phone]

## 2023-09-13 ENCOUNTER — Encounter: Payer: Self-pay | Admitting: Family Medicine

## 2023-09-13 ENCOUNTER — Ambulatory Visit: Payer: Medicaid Other | Admitting: Family Medicine

## 2023-09-13 VITALS — BP 140/70 | HR 61 | Temp 98.4°F | Ht 65.0 in | Wt 149.0 lb

## 2023-09-13 DIAGNOSIS — M545 Low back pain, unspecified: Secondary | ICD-10-CM | POA: Diagnosis not present

## 2023-09-13 DIAGNOSIS — M5136 Other intervertebral disc degeneration, lumbar region with discogenic back pain only: Secondary | ICD-10-CM

## 2023-09-13 DIAGNOSIS — M1732 Unilateral post-traumatic osteoarthritis, left knee: Secondary | ICD-10-CM | POA: Diagnosis not present

## 2023-09-13 DIAGNOSIS — G8929 Other chronic pain: Secondary | ICD-10-CM

## 2023-09-13 DIAGNOSIS — Z1211 Encounter for screening for malignant neoplasm of colon: Secondary | ICD-10-CM | POA: Diagnosis not present

## 2023-09-13 DIAGNOSIS — Z6379 Other stressful life events affecting family and household: Secondary | ICD-10-CM | POA: Diagnosis not present

## 2023-09-13 MED ORDER — OXYCODONE-ACETAMINOPHEN 10-325 MG PO TABS
0.5000 | ORAL_TABLET | Freq: Three times a day (TID) | ORAL | 0 refills | Status: DC | PRN
Start: 2023-10-28 — End: 2023-11-01

## 2023-09-13 MED ORDER — OXYCODONE-ACETAMINOPHEN 10-325 MG PO TABS
0.5000 | ORAL_TABLET | Freq: Three times a day (TID) | ORAL | 0 refills | Status: DC | PRN
Start: 2023-11-27 — End: 2023-11-01

## 2023-09-13 MED ORDER — OXYCODONE-ACETAMINOPHEN 10-325 MG PO TABS
0.5000 | ORAL_TABLET | Freq: Three times a day (TID) | ORAL | 0 refills | Status: DC | PRN
Start: 2023-09-28 — End: 2023-12-20

## 2023-09-13 NOTE — Progress Notes (Signed)
Subjective: CC: Chronic pain syndrome PCP: Raliegh Ip, DO [id number] E Ashley Marquez is a 62 y.o. female presenting to clinic today for:  1.  Chronic pain syndrome Patient continues to take oxycodone 3 times daily.  Denies any constipation, excessive daytime sedation, falls.  Pain is chronic and stable.  She is been under more stress lately due to her mom.  She apparently has sustained recurrent falls and suffered a fracture of her right wrist with another fall recently and some tears of her upper extremities.  She notes that her daughter is helping her a lot with her mother and this has been super helpful.   ROS: Per HPI  Allergies  Allergen Reactions   Gabapentin     hallucination   Penicillins Nausea And Vomiting and Other (See Comments)    Patient slept for 2 days.  Has patient had a PCN reaction causing immediate rash, facial/tongue/throat swelling, SOB or lightheadedness with hypotension: No Has patient had a PCN reaction causing severe rash involving mucus membranes or skin necrosis: No Has patient had a PCN reaction that required hospitalization No Has patient had a PCN reaction occurring within the last 10 years: No If all of the above answers are "NO", then may proceed with Cephalosporin use.    Past Medical History:  Diagnosis Date   Anxiety    Heart murmur     Current Outpatient Medications:    albuterol (VENTOLIN HFA) 108 (90 Base) MCG/ACT inhaler, Inhale 2 puffs into the lungs every 6 (six) hours as needed for wheezing or shortness of breath., Disp: 8 g, Rfl: 0   apixaban (ELIQUIS) 5 MG TABS tablet, Take 1 tablet (5 mg total) by mouth 2 (two) times daily., Disp: 180 tablet, Rfl: 3   cyclobenzaprine (FLEXERIL) 5 MG tablet, Take 1 tablet (5 mg total) by mouth 3 (three) times daily as needed for muscle spasms., Disp: 30 tablet, Rfl: 1   oxyCODONE-acetaminophen (PERCOCET) 10-325 MG tablet, Take 0.5-1 tablets by mouth every 8 (eight) hours as needed for pain., Disp:  75 tablet, Rfl: 0   oxyCODONE-acetaminophen (PERCOCET) 10-325 MG tablet, Take 0.5-1 tablets by mouth every 8 (eight) hours as needed for pain., Disp: 75 tablet, Rfl: 0   oxyCODONE-acetaminophen (PERCOCET) 10-325 MG tablet, Take 1 tablet by mouth every 8 (eight) hours as needed for pain., Disp: 75 tablet, Rfl: 0   triamcinolone cream (KENALOG) 0.1 %, Apply 1 Application topically 2 (two) times daily as needed (poison oak)., Disp: 80 g, Rfl: 0   diltiazem (CARDIZEM CD) 180 MG 24 hr capsule, Take 1 capsule (180 mg total) by mouth daily., Disp: 90 capsule, Rfl: 3 Social History   Socioeconomic History   Marital status: Divorced    Spouse name: Not on file   Number of children: 2   Years of education: Not on file   Highest education level: Not on file  Occupational History    Comment: [employer]  Tobacco Use   Smoking status: Never   Smokeless tobacco: Never  Vaping Use   Vaping status: Never Used  Substance and Sexual Activity   Alcohol use: No   Drug use: No   Sexual activity: Not Currently  Other Topics Concern   Not on file  Social History Narrative   Not on file   Social Determinants of Health   Financial Resource Strain: Not on file  Food Insecurity: No Food Insecurity (06/04/2023)   Hunger Vital Sign    Worried About Running Out of Food in  the Last Year: Never true    Ran Out of Food in the Last Year: Never true  Transportation Needs: No Transportation Needs (06/04/2023)   PRAPARE - Administrator, Civil Service (Medical): No    Lack of Transportation (Non-Medical): No  Physical Activity: Not on file  Stress: Not on file  Social Connections: Not on file  Intimate Partner Violence: Not At Risk (06/01/2023)   Humiliation, Afraid, Rape, and Kick questionnaire    Fear of Current or Ex-Partner: No    Emotionally Abused: No    Physically Abused: No    Sexually Abused: No   Family History  Problem Relation Age of Onset   Deep vein thrombosis Mother    Emphysema  Father 63    Objective: Office vital signs reviewed. BP (!) 140/70   Pulse 61   Temp 98.4 F (36.9 C)   Ht 5\' 5"  (1.651 m)   Wt 149 lb (67.6 kg)   SpO2 97%   BMI 24.79 kg/m   Physical Examination:  General: Awake, alert, well nourished, No acute distress HEENT: sclera [place], MMM Cardio: regular rate and rhythm, S1S2 heard, no murmurs appreciated Pulm: clear to auscultation bilaterally, no wheezes, rhonchi or rales; normal work of breathing on room air MSK: Ambulating independently with normal gait and station.     09/13/2023    3:57 PM 07/26/2023   10:44 AM 06/09/2023    2:00 PM  Depression screen PHQ 2/9  Decreased Interest 0 0 0  Down, Depressed, Hopeless 0 0 0  PHQ - 2 Score 0 0 0  Altered sleeping 0 0 0  Tired, decreased energy 0 0 0  Change in appetite 0 0 0  Feeling bad or failure about yourself  0 0 0  Trouble concentrating 0 0 0  Moving slowly or fidgety/restless 0 0 0  Suicidal thoughts 0 0 0  PHQ-9 Score 0 0 0  Difficult doing work/chores Not difficult at all Not difficult at all Not difficult at all      09/13/2023    3:57 PM 07/26/2023   10:44 AM 03/31/2023    3:31 PM 12/28/2022    3:21 PM  GAD 7 : Generalized Anxiety Score  Nervous, Anxious, on Edge 0 0 0 0  Control/stop worrying 0 0 0 0  Worry too much - different things 0 0 0 0  Trouble relaxing 0 0 0 0  Restless 0 0 0 0  Easily annoyed or irritable 0 0 0 0  Afraid - awful might happen 0 0 0 0  Total GAD 7 Score 0 0 0 0  Anxiety Difficulty Not difficult at all Not difficult at all Not difficult at all Not difficult at all   Assessment/ Plan: 63 y.o. female   Degeneration of intervertebral disc of lumbar region with discogenic back pain - Plan: oxyCODONE-acetaminophen (PERCOCET) 10-325 MG tablet, oxyCODONE-acetaminophen (PERCOCET) 10-325 MG tablet, oxyCODONE-acetaminophen (PERCOCET) 10-325 MG tablet  Post-traumatic osteoarthritis of left knee - Plan: oxyCODONE-acetaminophen (PERCOCET) 10-325 MG  tablet, oxyCODONE-acetaminophen (PERCOCET) 10-325 MG tablet, oxyCODONE-acetaminophen (PERCOCET) 10-325 MG tablet  Chronic right-sided low back pain without sciatica - Plan: oxyCODONE-acetaminophen (PERCOCET) 10-325 MG tablet, oxyCODONE-acetaminophen (PERCOCET) 10-325 MG tablet, oxyCODONE-acetaminophen (PERCOCET) 10-325 MG tablet  Stress due to illness of family member  Colon cancer screening - Plan: Cologuard  Pain is chronic and stable.  Up-to-date on UDS and CSC.  National narcotic database reviewed and there were no red flags.  Medications have been renewed and sent to  [facility] per request.  Will plan for updated UDS and CSA at next visit as it will lapse in February  Continues to have some stress due to the illness of her mother but seems to have great support by her daughter.  Referral for Cologuard placed today   Raliegh Ip, DO [facility] [phone]

## 2023-09-23 ENCOUNTER — Telehealth: Payer: Self-pay

## 2023-10-26 DIAGNOSIS — Z8679 Personal history of other diseases of the circulatory system: Secondary | ICD-10-CM | POA: Diagnosis not present

## 2023-10-26 DIAGNOSIS — H9201 Otalgia, right ear: Secondary | ICD-10-CM | POA: Diagnosis not present

## 2023-10-26 DIAGNOSIS — J0100 Acute maxillary sinusitis, unspecified: Secondary | ICD-10-CM | POA: Diagnosis not present

## 2023-10-26 DIAGNOSIS — H6993 Unspecified Eustachian tube disorder, bilateral: Secondary | ICD-10-CM | POA: Diagnosis not present

## 2023-10-26 DIAGNOSIS — R0982 Postnasal drip: Secondary | ICD-10-CM | POA: Diagnosis not present

## 2023-10-29 ENCOUNTER — Encounter: Payer: Self-pay | Admitting: Family Medicine

## 2023-10-29 DIAGNOSIS — M5136 Other intervertebral disc degeneration, lumbar region with discogenic back pain only: Secondary | ICD-10-CM

## 2023-10-29 DIAGNOSIS — M1732 Unilateral post-traumatic osteoarthritis, left knee: Secondary | ICD-10-CM

## 2023-10-29 DIAGNOSIS — M545 Low back pain, unspecified: Secondary | ICD-10-CM

## 2023-11-01 MED ORDER — OXYCODONE-ACETAMINOPHEN 10-325 MG PO TABS
0.5000 | ORAL_TABLET | Freq: Three times a day (TID) | ORAL | 0 refills | Status: DC | PRN
Start: 2023-11-01 — End: 2023-12-20

## 2023-11-01 MED ORDER — OXYCODONE-ACETAMINOPHEN 10-325 MG PO TABS
0.5000 | ORAL_TABLET | Freq: Three times a day (TID) | ORAL | 0 refills | Status: DC | PRN
Start: 2023-12-01 — End: 2023-12-20

## 2023-12-15 ENCOUNTER — Encounter: Payer: Self-pay | Admitting: Cardiology

## 2023-12-15 ENCOUNTER — Ambulatory Visit: Payer: Medicaid Other | Attending: Cardiology | Admitting: Cardiology

## 2023-12-15 VITALS — BP 140/70 | HR 97 | Ht 65.0 in | Wt 148.4 lb

## 2023-12-15 DIAGNOSIS — I10 Essential (primary) hypertension: Secondary | ICD-10-CM

## 2023-12-15 DIAGNOSIS — I4891 Unspecified atrial fibrillation: Secondary | ICD-10-CM

## 2023-12-15 MED ORDER — DILTIAZEM HCL ER COATED BEADS 180 MG PO CP24
180.0000 mg | ORAL_CAPSULE | Freq: Every day | ORAL | 11 refills | Status: DC
Start: 2023-12-15 — End: 2024-07-04

## 2023-12-15 NOTE — Patient Instructions (Signed)
Our records indicate that you are due for your screening mammogram.  Please call the imaging center that does your yearly mammograms to make an appointment for a mammogram at your earliest convenience. Our office also has a mobile unit through the [facility] that comes to our location. Please let our office know if you would like to make an appointment.

## 2023-12-15 NOTE — Progress Notes (Signed)
 Clinical Summary [name] is a 63 y.o.female seen today for follow up of the following medical problems.   1.PAF - new diagnosis during 05/2023 admission - started on diltaizem for rate control. CHADS2Vasc score was 2, elecated to start anticoagulation - at 05/2023 f/u had returned to [facility].   - no recent palpitatoins - compliant with meds.   2. HTN - just took meds this morning   Past Medical History:  Diagnosis Date   Anxiety    Heart murmur      Allergies  Allergen Reactions   Gabapentin      hallucination   Penicillins Nausea And Vomiting and Other (See Comments)    Patient slept for 2 days.  Has patient had a PCN reaction causing immediate rash, facial/tongue/throat swelling, SOB or lightheadedness with hypotension: No Has patient had a PCN reaction causing severe rash involving mucus membranes or skin necrosis: No Has patient had a PCN reaction that required hospitalization No Has patient had a PCN reaction occurring within the last 10 years: No If all of the above answers are "NO", then may proceed with Cephalosporin use.      Current Outpatient Medications  Medication Sig Dispense Refill   albuterol  (VENTOLIN  HFA) 108 (90 Base) MCG/ACT inhaler Inhale 2 puffs into the lungs every 6 (six) hours as needed for wheezing or shortness of breath. 8 g 0   cyclobenzaprine  (FLEXERIL ) 5 MG tablet Take 1 tablet (5 mg total) by mouth 3 (three) times daily as needed for muscle spasms. 30 tablet 1   oxyCODONE -acetaminophen  (PERCOCET) 10-325 MG tablet Take 0.5-1 tablets by mouth every 8 (eight) hours as needed for pain. 75 tablet 0   oxyCODONE -acetaminophen  (PERCOCET) 10-325 MG tablet Take 0.5-1 tablets by mouth every 8 (eight) hours as needed for pain. 75 tablet 0   oxyCODONE -acetaminophen  (PERCOCET) 10-325 MG tablet Take 0.5-1 tablets by mouth every 8 (eight) hours as needed for pain. 75 tablet 0   triamcinolone  cream (KENALOG ) 0.1 % Apply 1 Application topically 2 (two)  times daily as needed (poison oak). 80 g 0   diltiazem  (CARDIZEM  CD) 180 MG 24 hr capsule Take 1 capsule (180 mg total) by mouth daily. 30 capsule 11   No current facility-administered medications for this visit.     Past Surgical History:  Procedure Laterality Date   ABDOMINAL HYSTERECTOMY     None       Allergies  Allergen Reactions   Gabapentin      hallucination   Penicillins Nausea And Vomiting and Other (See Comments)    Patient slept for 2 days.  Has patient had a PCN reaction causing immediate rash, facial/tongue/throat swelling, SOB or lightheadedness with hypotension: No Has patient had a PCN reaction causing severe rash involving mucus membranes or skin necrosis: No Has patient had a PCN reaction that required hospitalization No Has patient had a PCN reaction occurring within the last 10 years: No If all of the above answers are "NO", then may proceed with Cephalosporin use.       Family History  Problem Relation Age of Onset   Deep vein thrombosis Mother    Emphysema Father 54     Social History [name] reports that she has never smoked. She has never used smokeless tobacco. [name] reports no history of alcohol use.   Review of Systems CONSTITUTIONAL: No weight loss, fever, chills, weakness or fatigue.  HEENT: Eyes: No visual loss, blurred vision, double vision or yellow sclerae.No hearing loss,  sneezing, congestion, runny nose or sore throat.  SKIN: No rash or itching.  CARDIOVASCULAR: per hpi RESPIRATORY: No shortness of breath, cough or sputum.  GASTROINTESTINAL: No anorexia, nausea, vomiting or diarrhea. No abdominal pain or blood.  GENITOURINARY: No burning on urination, no polyuria NEUROLOGICAL: No headache, dizziness, syncope, paralysis, ataxia, numbness or tingling in the extremities. No change in bowel or bladder control.  MUSCULOSKELETAL: No muscle, back pain, joint pain or stiffness.  LYMPHATICS: No enlarged nodes. No history of  splenectomy.  PSYCHIATRIC: No history of depression or anxiety.  ENDOCRINOLOGIC: No reports of sweating, cold or heat intolerance. No polyuria or polydipsia.  Aaron Aas   Physical Examination Today's Vitals   12/15/23 1116 12/15/23 1149  BP: (!) 140/62 (!) 140/70  Pulse: 97   SpO2: 98%   Weight: 148 lb 6.4 oz (67.3 kg)   Height: 5\' 5"  (1.651 m)    Body mass index is 24.7 kg/m.  Gen: resting comfortably, no acute distress HEENT: no scleral icterus, pupils equal round and reactive, no palptable cervical adenopathy,  CV: RRR, no m/rg, no jvd Resp: Clear to auscultation bilaterally GI: abdomen is soft, non-tender, non-distended, normal bowel sounds, no hepatosplenomegaly MSK: extremities are warm, no edema.  Skin: warm, no rash Neuro:  no focal deficits Psych: appropriate affect   Diagnostic Studies   05/2023 echo 1. Left ventricular ejection fraction, by estimation, is 60 to 65%. The  left ventricle has normal function. The left ventricle has no regional  wall motion abnormalities. Left ventricular diastolic parameters are  indeterminate.   2. Right ventricular systolic function is normal. The right ventricular  size is normal. There is normal pulmonary artery systolic pressure.   3. The mitral valve is normal in structure. Mild mitral valve  regurgitation. No evidence of mitral stenosis.   4. The tricuspid valve is abnormal.   5. The aortic valve is tricuspid. Aortic valve regurgitation is mild. No  aortic stenosis is present.   6. The inferior vena cava is normal in size with greater than 50%  respiratory variability, suggesting right atrial pressure of 3 mmHg.      Assessment and Plan  1.Afib - no symptoms, she had self converted at last f/u. Normal rhythm by exam today - CHADS2Vasc score is just 2, class IIA recommendation to consider anticoagulation. She initially had elected to start, at this time comfortable discontinuing but understands if develops additional risk  factor in the future would need to reconsider.   2. HTN - elevated here, she will call Monday with home bp's - if elevated would increase her diltiazem    F/u 6 months      [name], M.D.

## 2023-12-15 NOTE — Patient Instructions (Signed)
 Medication Instructions:  Your physician has recommended you make the following change in your medication:   -Stop Eliquis .   *If you need a refill on your cardiac medications before your next appointment, please call your pharmacy*   Lab Work: None If you have labs (blood work) drawn today and your tests are completely normal, you will receive your results only by: MyChart Message (if you have MyChart) OR A paper copy in the mail If you have any lab test that is abnormal or we need to change your treatment, we will call you to review the results.   Testing/Procedures: None   Follow-Up: At [facility], you and your health needs are our priority.  As part of our continuing mission to provide you with exceptional heart care, we have created designated Provider Care Teams.  These Care Teams include your primary Cardiologist (physician) and Advanced Practice Providers (APPs -  Physician Assistants and Nurse Practitioners) who all work together to provide you with the care you need, when you need it.  We recommend signing up for the patient portal called "MyChart".  Sign up information is provided on this After Visit Summary.  MyChart is used to connect with patients for Virtual Visits (Telemedicine).  Patients are able to view lab/test results, encounter notes, upcoming appointments, etc.  Non-urgent messages can be sent to your provider as well.   To learn more about what you can do with MyChart, go to [email or web address].    Your next appointment:   6 month(s)  Provider:   You may see Armida Lander, MD or one of the following Advanced Practice Providers on your designated Care Team:   Woodfin Hays, PA-C  Theotis Flake, PA-C     Other Instructions Call our office Monday with your home blood pressure log.

## 2023-12-20 ENCOUNTER — Ambulatory Visit: Payer: Medicaid Other | Admitting: Family Medicine

## 2023-12-20 ENCOUNTER — Encounter: Payer: Self-pay | Admitting: Family Medicine

## 2023-12-20 VITALS — BP 125/71 | HR 72 | Temp 98.7°F | Ht 65.0 in | Wt 150.2 lb

## 2023-12-20 DIAGNOSIS — Z79899 Other long term (current) drug therapy: Secondary | ICD-10-CM

## 2023-12-20 DIAGNOSIS — M545 Low back pain, unspecified: Secondary | ICD-10-CM

## 2023-12-20 DIAGNOSIS — M5136 Other intervertebral disc degeneration, lumbar region with discogenic back pain only: Secondary | ICD-10-CM

## 2023-12-20 DIAGNOSIS — M1732 Unilateral post-traumatic osteoarthritis, left knee: Secondary | ICD-10-CM | POA: Diagnosis not present

## 2023-12-20 DIAGNOSIS — G8929 Other chronic pain: Secondary | ICD-10-CM | POA: Diagnosis not present

## 2023-12-20 MED ORDER — OXYCODONE-ACETAMINOPHEN 10-325 MG PO TABS
0.5000 | ORAL_TABLET | Freq: Three times a day (TID) | ORAL | 0 refills | Status: DC | PRN
Start: 2024-02-28 — End: 2024-03-29

## 2023-12-20 MED ORDER — OXYCODONE-ACETAMINOPHEN 10-325 MG PO TABS
0.5000 | ORAL_TABLET | Freq: Three times a day (TID) | ORAL | 0 refills | Status: DC | PRN
Start: 2023-12-31 — End: 2024-03-29

## 2023-12-20 MED ORDER — OXYCODONE-ACETAMINOPHEN 10-325 MG PO TABS
0.5000 | ORAL_TABLET | Freq: Three times a day (TID) | ORAL | 0 refills | Status: DC | PRN
Start: 2024-01-29 — End: 2024-03-29

## 2023-12-20 NOTE — Progress Notes (Signed)
Subjective: CC: Chronic pain management PCP: Raliegh Ip, DO Ashley Marquez is a 63 y.o. female presenting to clinic today for:  1.  Degenerative disc disease, lumbar spine, chronic pain Patient continues to use her Percocet up to 3 times daily as needed.  Denies any excessive daytime sedation, constipation, falls, respiratory depression.  She continues to work full-time and take care of her mother, who is suffering from end-stage renal disease and dementia.  She does report increased stress and sometimes increased pain when she is having to do more for her mom   ROS: Per HPI  Allergies  Allergen Reactions   Gabapentin     hallucination   Penicillins Nausea And Vomiting and Other (See Comments)    Patient slept for 2 days.  Has patient had a PCN reaction causing immediate rash, facial/tongue/throat swelling, SOB or lightheadedness with hypotension: No Has patient had a PCN reaction causing severe rash involving mucus membranes or skin necrosis: No Has patient had a PCN reaction that required hospitalization No Has patient had a PCN reaction occurring within the last 10 years: No If all of the above answers are "NO", then may proceed with Cephalosporin use.    Past Medical History:  Diagnosis Date   Anxiety    Heart murmur     Current Outpatient Medications:    albuterol (VENTOLIN HFA) 108 (90 Base) MCG/ACT inhaler, Inhale 2 puffs into the lungs every 6 (six) hours as needed for wheezing or shortness of breath., Disp: 8 g, Rfl: 0   cyclobenzaprine (FLEXERIL) 5 MG tablet, Take 1 tablet (5 mg total) by mouth 3 (three) times daily as needed for muscle spasms., Disp: 30 tablet, Rfl: 1   diltiazem (CARDIZEM CD) 180 MG 24 hr capsule, Take 1 capsule (180 mg total) by mouth daily., Disp: 30 capsule, Rfl: 11   oxyCODONE-acetaminophen (PERCOCET) 10-325 MG tablet, Take 0.5-1 tablets by mouth every 8 (eight) hours as needed for pain., Disp: 75 tablet, Rfl: 0    oxyCODONE-acetaminophen (PERCOCET) 10-325 MG tablet, Take 0.5-1 tablets by mouth every 8 (eight) hours as needed for pain., Disp: 75 tablet, Rfl: 0   triamcinolone cream (KENALOG) 0.1 %, Apply 1 Application topically 2 (two) times daily as needed (poison oak)., Disp: 80 g, Rfl: 0 Social History   Socioeconomic History   Marital status: Divorced    Spouse name: Not on file   Number of children: 2   Years of education: Not on file   Highest education level: Not on file  Occupational History    Comment: [employer]  Tobacco Use   Smoking status: Never   Smokeless tobacco: Never  Vaping Use   Vaping status: Never Used  Substance and Sexual Activity   Alcohol use: No   Drug use: No   Sexual activity: Not Currently  Other Topics Concern   Not on file  Social History Narrative   Not on file   Social Drivers of Health   Financial Resource Strain: Not on file  Food Insecurity: No Food Insecurity (06/04/2023)   Hunger Vital Sign    Worried About Running Out of Food in the Last Year: Never true    Ran Out of Food in the Last Year: Never true  Transportation Needs: No Transportation Needs (06/04/2023)   PRAPARE - Administrator, Civil Service (Medical): No    Lack of Transportation (Non-Medical): No  Physical Activity: Not on file  Stress: Not on file  Social Connections: Not on  file  Intimate Partner Violence: Not At Risk (06/01/2023)   Humiliation, Afraid, Rape, and Kick questionnaire    Fear of Current or Ex-Partner: No    Emotionally Abused: No    Physically Abused: No    Sexually Abused: No   Family History  Problem Relation Age of Onset   Deep vein thrombosis Mother    Emphysema Father 34    Objective: Office vital signs reviewed. BP 125/71   Pulse 72   Temp 98.7 F (37.1 C)   Ht 5\' 5"  (1.651 m)   Wt 150 lb 3.2 oz (68.1 kg)   SpO2 98%   BMI 24.99 kg/m   Physical Examination:  General: Awake, alert, well-appearing female, No acute distress HEENT sclera  [place].  Moist mucous membranes Cardio: regular rate and rhythm, S1S2 heard, no murmurs appreciated Pulm: clear to auscultation bilaterally, no wheezes, rhonchi or rales; normal work of breathing on room air MSK: Ambulating independently.  Normal strength and tone.  Assessment/ Plan: 63 y.o. female   Degeneration of intervertebral disc of lumbar region with discogenic back pain - Plan: oxyCODONE-acetaminophen (PERCOCET) 10-325 MG tablet, oxyCODONE-acetaminophen (PERCOCET) 10-325 MG tablet, oxyCODONE-acetaminophen (PERCOCET) 10-325 MG tablet, ToxASSURE Select 13 (MW), Urine  Post-traumatic osteoarthritis of left knee - Plan: oxyCODONE-acetaminophen (PERCOCET) 10-325 MG tablet, oxyCODONE-acetaminophen (PERCOCET) 10-325 MG tablet, oxyCODONE-acetaminophen (PERCOCET) 10-325 MG tablet, ToxASSURE Select 13 (MW), Urine  Chronic right-sided low back pain without sciatica - Plan: oxyCODONE-acetaminophen (PERCOCET) 10-325 MG tablet, oxyCODONE-acetaminophen (PERCOCET) 10-325 MG tablet, oxyCODONE-acetaminophen (PERCOCET) 10-325 MG tablet, ToxASSURE Select 13 (MW), Urine  Controlled substance agreement signed - Plan: ToxASSURE Select 13 (MW), Urine  UDS and CSA were updated as per office policy.  The national narcotic database was reviewed and there were no red flags.  Percocet renewed.  Follow-up in 3 months, sooner if concerns arise   Raliegh Ip, DO [facility] [phone]

## 2023-12-22 LAB — TOXASSURE SELECT 13 (MW), URINE

## 2024-02-02 ENCOUNTER — Telehealth: Payer: Self-pay

## 2024-02-02 ENCOUNTER — Other Ambulatory Visit (HOSPITAL_COMMUNITY): Payer: Self-pay

## 2024-02-02 NOTE — Telephone Encounter (Signed)
 Pharmacy Patient Advocate Encounter  Received notification from [facility] MEDICAID that Prior Authorization for oxyCODONE-Acetaminophen 10-325MG  tablets has been APPROVED from 02/02/24 to 08/04/24. Ran test claim, Copay is $4. This test claim was processed through [facility]- copay amounts may vary at other pharmacies due to pharmacy/plan contracts, or as the patient moves through the different stages of their insurance plan.   PA #/Case ID/Reference #: [id number]

## 2024-02-02 NOTE — Telephone Encounter (Signed)
 Pharmacy Patient Advocate Encounter   Received notification from  [facility] Portal that prior authorization for oxyCODONE-Acetaminophen 10-325MG  tablets is required/requested.   Insurance verification completed.   The patient is insured through [facility] MEDICAID .   Per test claim: PA required; PA submitted to above mentioned insurance via CoverMyMeds Key/confirmation #/EOC [id number] Status is pending

## 2024-02-09 ENCOUNTER — Encounter: Payer: Self-pay | Admitting: Family Medicine

## 2024-03-14 ENCOUNTER — Other Ambulatory Visit: Payer: Self-pay | Admitting: Family Medicine

## 2024-03-14 DIAGNOSIS — Z1231 Encounter for screening mammogram for malignant neoplasm of breast: Secondary | ICD-10-CM

## 2024-03-15 ENCOUNTER — Other Ambulatory Visit: Payer: Self-pay | Admitting: Family Medicine

## 2024-03-15 ENCOUNTER — Encounter: Payer: Self-pay | Admitting: Family Medicine

## 2024-03-15 ENCOUNTER — Other Ambulatory Visit: Payer: Self-pay

## 2024-03-15 DIAGNOSIS — L309 Dermatitis, unspecified: Secondary | ICD-10-CM

## 2024-03-15 DIAGNOSIS — K219 Gastro-esophageal reflux disease without esophagitis: Secondary | ICD-10-CM

## 2024-03-15 MED ORDER — TRIAMCINOLONE ACETONIDE 0.1 % EX CREA: 1.0000 | TOPICAL_CREAM | Freq: Two times a day (BID) | CUTANEOUS | 0 refills | Status: DC | PRN

## 2024-03-15 MED ORDER — OMEPRAZOLE 20 MG PO CPDR
20.0000 mg | DELAYED_RELEASE_CAPSULE | Freq: Every day | ORAL | 3 refills | Status: AC
Start: 2024-03-15 — End: ?

## 2024-03-17 DIAGNOSIS — B029 Zoster without complications: Secondary | ICD-10-CM | POA: Diagnosis not present

## 2024-03-17 NOTE — Progress Notes (Signed)
 Assessment and Plan:   Assessment & Plan Herpes zoster without complication  Orders: .  valACYclovir (VALTREX) 1000 MG tablet; Take 1 tablet (1,000 mg total) by mouth every eight (8) hours for 7 days.   Tick removed from left thigh on Monday. Site looks well no redness, rash, or signs of infection present.   The following portions of the patient's history were reviewed and updated as appropriate: allergies, current medications, past family history, past medical history, past social history, past surgical history and problem list.  Subjective:   Patient ID: Ashley Marquez is a 63 y.o. female Chief Complaint  Patient presents with  . Rash    Right side  rash and painful-thinks shingles-began Monday  . Shoulder Pain    Right side  . Tick Removal    Left leg on Monday morning was removed-deer tick    Rash This is a new problem. The current episode started in the past 7 days. The problem is unchanged. The affected locations include the torso. The rash is characterized by blistering and redness.  Shoulder Pain  The pain is present in the right shoulder. This is a recurrent problem. The problem has been resolved. Pertinent negatives include no inability to bear weight or limited range of motion.    ROS: Negative unless otherwise noted in HPI.  Objective:   Vital Signs:  BP 166/89 (BP Site: L Arm, BP Position: Sitting, BP Cuff Size: Large)   Pulse 84   Temp 37 C (98.6 F) (Tympanic)   Resp 20   Ht 165.1 cm (5' 5)   Wt 68.3 kg (150 lb 9.6 oz)   SpO2 98%   BMI 25.06 kg/m   Body mass index is 25.06 kg/m. No LMP recorded. Patient has had a hysterectomy.  Physical Exam Vitals and nursing note reviewed.  Constitutional:      General: She is not in acute distress.    Appearance: Normal appearance. She is normal weight. She is not ill-appearing or toxic-appearing.  HENT:     Head: Normocephalic.     Right Ear: Tympanic membrane, ear canal and external ear normal.     Left  Ear: Tympanic membrane, ear canal and external ear normal.     Nose: Nose normal.     Mouth/Throat:     Mouth: Mucous membranes are moist.     Pharynx: Oropharynx is clear.  Eyes:     Extraocular Movements: Extraocular movements intact.     Conjunctiva/sclera: Conjunctivae normal.     Pupils: Pupils are equal, round, and reactive to light.  Cardiovascular:     Rate and Rhythm: Normal rate and regular rhythm.     Heart sounds: Normal heart sounds.  Pulmonary:     Effort: Pulmonary effort is normal. No respiratory distress.     Breath sounds: Normal breath sounds.  Abdominal:     General: Abdomen is flat.     Palpations: Abdomen is soft.  Musculoskeletal:        General: Normal range of motion.     Cervical back: Normal range of motion.  Skin:    General: Skin is warm and dry.     Findings: Rash present. Rash is vesicular.       Neurological:     General: No focal deficit present.     Mental Status: She is alert and oriented to person, place, and time. Mental status is at baseline.  Psychiatric:        Mood and Affect: Mood normal.  Behavior: Behavior normal.        Thought Content: Thought content normal.        Judgment: Judgment normal.      Follow-up as Needed   Disposition Upon Discharge:  1.  Routine symptom specific, illness specific and/or disease specific instructions were discussed with the patient and/or caregiver at length.  2.  The differential diagnosis for the patient's specific symptoms were reviewed and discussed with the patient/caregiver at length; the patient/caregiver expressed understanding of all discussed and their relevant questions were all satisfactorily answered.  3.  Return to care should the presenting symptoms recur, persist, or worsen in any way; or in the alternative, if new symptoms or complaints develop.  4.  The patient and any family present were given verbal and/or written discharge instructions as clinically indicated and  appropriate.  5.  Continue OTC medications as needed and tolerated for control of the currently reported symptoms, if no conflict with the currently prescribed medications.

## 2024-03-29 ENCOUNTER — Ambulatory Visit: Payer: Medicaid Other

## 2024-03-29 ENCOUNTER — Encounter: Payer: Self-pay | Admitting: Family Medicine

## 2024-03-29 ENCOUNTER — Ambulatory Visit: Payer: Medicaid Other | Admitting: Family Medicine

## 2024-03-29 VITALS — BP 127/73 | HR 62 | Temp 98.2°F | Ht 65.0 in | Wt 151.4 lb

## 2024-03-29 DIAGNOSIS — G8929 Other chronic pain: Secondary | ICD-10-CM

## 2024-03-29 DIAGNOSIS — M5136 Other intervertebral disc degeneration, lumbar region with discogenic back pain only: Secondary | ICD-10-CM

## 2024-03-29 DIAGNOSIS — M1732 Unilateral post-traumatic osteoarthritis, left knee: Secondary | ICD-10-CM | POA: Diagnosis not present

## 2024-03-29 DIAGNOSIS — B0229 Other postherpetic nervous system involvement: Secondary | ICD-10-CM

## 2024-03-29 DIAGNOSIS — Z6379 Other stressful life events affecting family and household: Secondary | ICD-10-CM | POA: Diagnosis not present

## 2024-03-29 DIAGNOSIS — M545 Low back pain, unspecified: Secondary | ICD-10-CM

## 2024-03-29 MED ORDER — METHYLPREDNISOLONE ACETATE 80 MG/ML IJ SUSP
80.0000 mg | Freq: Once | INTRAMUSCULAR | Status: DC
Start: 2024-03-29 — End: 2024-07-05

## 2024-03-29 MED ORDER — OXYCODONE-ACETAMINOPHEN 10-325 MG PO TABS
0.5000 | ORAL_TABLET | Freq: Three times a day (TID) | ORAL | 0 refills | Status: DC | PRN
Start: 2024-04-28 — End: 2024-07-05

## 2024-03-29 MED ORDER — OXYCODONE-ACETAMINOPHEN 10-325 MG PO TABS
0.5000 | ORAL_TABLET | Freq: Three times a day (TID) | ORAL | 0 refills | Status: DC | PRN
Start: 2024-05-28 — End: 2024-07-05

## 2024-03-29 MED ORDER — OXYCODONE-ACETAMINOPHEN 10-325 MG PO TABS
0.5000 | ORAL_TABLET | Freq: Three times a day (TID) | ORAL | 0 refills | Status: DC | PRN
Start: 2024-03-29 — End: 2024-07-05

## 2024-03-29 NOTE — Progress Notes (Signed)
 Subjective: CC: Chronic pain management PCP: Eliodoro Guerin, DO ZOX:WRUEAV E Ashley Marquez is a 63 y.o. female presenting to clinic today for:  1.  DDD lumbar Patient here for interval checkup on chronic pain management.  She continues to take Percocet up to 3 times daily if needed for pain.  She reports that from a lumbar standpoint she has done okay but she is actually had pretty bad right sided rib pain.  The lesion that we saw her for virtually actually did end up being shingles as I was suspicious of.  She sought care at the urgent care and was placed on Valtrex.  She notes the pain did calm down until she completed the Valtrex and then it started back again.  She has been applying the topical triamcinolone  in efforts to try and reduce some of the pain.  She has been intolerant to gabapentin  in the past due to hallucinations.  She has been utilizing her oxycodone  3 times daily rather than twice daily for the last several days due to this recent neuralgia   ROS: Per HPI  Allergies  Allergen Reactions   Gabapentin      hallucination   Penicillins Nausea And Vomiting and Other (See Comments)    Patient slept for 2 days.  Has patient had a PCN reaction causing immediate rash, facial/tongue/throat swelling, SOB or lightheadedness with hypotension: No Has patient had a PCN reaction causing severe rash involving mucus membranes or skin necrosis: No Has patient had a PCN reaction that required hospitalization No Has patient had a PCN reaction occurring within the last 10 years: No If all of the above answers are "NO", then may proceed with Cephalosporin use.    Past Medical History:  Diagnosis Date   Anxiety    Heart murmur     Current Outpatient Medications:    albuterol  (VENTOLIN  HFA) 108 (90 Base) MCG/ACT inhaler, Inhale 2 puffs into the lungs every 6 (six) hours as needed for wheezing or shortness of breath., Disp: 8 g, Rfl: 0   diltiazem  (CARDIZEM  CD) 180 MG 24 hr capsule, Take 1  capsule (180 mg total) by mouth daily., Disp: 30 capsule, Rfl: 11   omeprazole  (PRILOSEC) 20 MG capsule, Take 1 capsule (20 mg total) by mouth daily., Disp: 90 capsule, Rfl: 3   oxyCODONE -acetaminophen  (PERCOCET) 10-325 MG tablet, Take 0.5-1 tablets by mouth every 8 (eight) hours as needed for pain., Disp: 75 tablet, Rfl: 0   oxyCODONE -acetaminophen  (PERCOCET) 10-325 MG tablet, Take 0.5-1 tablets by mouth every 8 (eight) hours as needed for pain., Disp: 75 tablet, Rfl: 0   oxyCODONE -acetaminophen  (PERCOCET) 10-325 MG tablet, Take 0.5-1 tablets by mouth every 8 (eight) hours as needed for pain., Disp: 75 tablet, Rfl: 0   triamcinolone  cream (KENALOG ) 0.1 %, Apply 1 Application topically 2 (two) times daily as needed (poison oak)., Disp: 80 g, Rfl: 0 Social History   Socioeconomic History   Marital status: Divorced    Spouse name: Not on file   Number of children: 2   Years of education: Not on file   Highest education level: Not on file  Occupational History    Comment: [employer]  Tobacco Use   Smoking status: Never   Smokeless tobacco: Never  Vaping Use   Vaping status: Never Used  Substance and Sexual Activity   Alcohol use: No   Drug use: No   Sexual activity: Not Currently  Other Topics Concern   Not on file  Social History Narrative  Not on file   Social Drivers of Health   Financial Resource Strain: Not on file  Food Insecurity: No Food Insecurity (03/17/2024)   Received from [facility]   Hunger Vital Sign    Worried About Running Out of Food in the Last Year: Never true    Ran Out of Food in the Last Year: Never true  Transportation Needs: No Transportation Needs (03/17/2024)   Received from [facility] - Transportation    Lack of Transportation (Medical): No    Lack of Transportation (Non-Medical): No  Physical Activity: Not on file  Stress: Not on file  Social Connections: Not on file  Intimate Partner Violence: Not At Risk (06/01/2023)    Humiliation, Afraid, Rape, and Kick questionnaire    Fear of Current or Ex-Partner: No    Emotionally Abused: No    Physically Abused: No    Sexually Abused: No   Family History  Problem Relation Age of Onset   Deep vein thrombosis Mother    Emphysema Father 55    Objective: Office vital signs reviewed. BP 127/73   Pulse 62   Temp 98.2 F (36.8 C)   Ht 5\' 5"  (1.651 m)   Wt 151 lb 6.4 oz (68.7 kg)   SpO2 97%   BMI 25.19 kg/m   Physical Examination:  General: Awake, alert, appears uncomfortable HEENT: sclera [place]onda, MMM Cardio: regular rate and rhythm, S1S2 heard, no murmurs appreciated Pulm: clear to auscultation bilaterally, no wheezes, rhonchi or rales; normal work of breathing on room air Skin: Small areas of erythema without vesicular formation along the right ribs at dermatome T7 MSK: Ambulating independently with normal gait and station  Assessment/ Plan: 63 y.o. female   Degeneration of intervertebral disc of lumbar region with discogenic back pain - Plan: oxyCODONE -acetaminophen  (PERCOCET) 10-325 MG tablet, oxyCODONE -acetaminophen  (PERCOCET) 10-325 MG tablet, oxyCODONE -acetaminophen  (PERCOCET) 10-325 MG tablet, methylPREDNISolone  acetate (DEPO-MEDROL ) injection 80 mg  Post-traumatic osteoarthritis of left knee - Plan: oxyCODONE -acetaminophen  (PERCOCET) 10-325 MG tablet, oxyCODONE -acetaminophen  (PERCOCET) 10-325 MG tablet, oxyCODONE -acetaminophen  (PERCOCET) 10-325 MG tablet  Chronic right-sided low back pain without sciatica - Plan: oxyCODONE -acetaminophen  (PERCOCET) 10-325 MG tablet, oxyCODONE -acetaminophen  (PERCOCET) 10-325 MG tablet, oxyCODONE -acetaminophen  (PERCOCET) 10-325 MG tablet  Stress due to illness of family member  Post herpetic neuralgia - Plan: methylPREDNISolone  acetate (DEPO-MEDROL ) injection 80 mg  I renewed her pain medication.  Depo-Medrol  administered today.  UDS and CSA are up-to-date and there are no red flags upon reviewing the national  narcotic database  Continues to have stress surrounding her mother.   Eliodoro Guerin, DO [facility] [phone]

## 2024-03-29 NOTE — Patient Instructions (Signed)
 Plan for fasting labs at your next visit.

## 2024-04-04 DIAGNOSIS — J029 Acute pharyngitis, unspecified: Secondary | ICD-10-CM | POA: Diagnosis not present

## 2024-04-06 ENCOUNTER — Encounter: Payer: Self-pay | Admitting: Family Medicine

## 2024-04-26 ENCOUNTER — Inpatient Hospital Stay: Admission: RE | Admit: 2024-04-26 | Source: Ambulatory Visit

## 2024-07-03 DIAGNOSIS — H25811 Combined forms of age-related cataract, right eye: Secondary | ICD-10-CM | POA: Diagnosis not present

## 2024-07-04 ENCOUNTER — Telehealth: Payer: Self-pay | Admitting: Cardiology

## 2024-07-04 ENCOUNTER — Ambulatory Visit: Admitting: Cardiology

## 2024-07-04 DIAGNOSIS — I4891 Unspecified atrial fibrillation: Secondary | ICD-10-CM

## 2024-07-04 MED ORDER — DILTIAZEM HCL ER COATED BEADS 180 MG PO CP24: 180.0000 mg | ORAL_CAPSULE | Freq: Every day | ORAL | 1 refills | Status: AC

## 2024-07-04 NOTE — Progress Notes (Deleted)
 Clinical Summary Ashley Marquez is a 63 y.o.female seen today for follow up of the following medical problems.    1.PAF - new diagnosis during 05/2023 admission - started on diltaizem for rate control. CHADS2Vasc score was 2, elecated to start anticoagulation - at 05/2023 f/u had returned to [facility].    - no recent palpitatoins - compliant with meds.    2. HTN - just took meds this morning  Past Medical History:  Diagnosis Date   Anxiety    Heart murmur      Allergies  Allergen Reactions   Gabapentin      hallucination   Penicillins Nausea And Vomiting and Other (See Comments)    Patient slept for 2 days.  Has patient had a PCN reaction causing immediate rash, facial/tongue/throat swelling, SOB or lightheadedness with hypotension: No Has patient had a PCN reaction causing severe rash involving mucus membranes or skin necrosis: No Has patient had a PCN reaction that required hospitalization No Has patient had a PCN reaction occurring within the last 10 years: No If all of the above answers are NO, then may proceed with Cephalosporin use.      Current Outpatient Medications  Medication Sig Dispense Refill   albuterol  (VENTOLIN  HFA) 108 (90 Base) MCG/ACT inhaler Inhale 2 puffs into the lungs every 6 (six) hours as needed for wheezing or shortness of breath. 8 g 0   diltiazem  (CARDIZEM  CD) 180 MG 24 hr capsule Take 1 capsule (180 mg total) by mouth daily. 30 capsule 11   omeprazole  (PRILOSEC) 20 MG capsule Take 1 capsule (20 mg total) by mouth daily. 90 capsule 3   oxyCODONE -acetaminophen  (PERCOCET) 10-325 MG tablet Take 0.5-1 tablets by mouth every 8 (eight) hours as needed for pain. 75 tablet 0   oxyCODONE -acetaminophen  (PERCOCET) 10-325 MG tablet Take 0.5-1 tablets by mouth every 8 (eight) hours as needed for pain. 75 tablet 0   oxyCODONE -acetaminophen  (PERCOCET) 10-325 MG tablet Take 0.5-1 tablets by mouth every 8 (eight) hours as needed for pain. 75 tablet 0    triamcinolone  cream (KENALOG ) 0.1 % Apply 1 Application topically 2 (two) times daily as needed (poison oak). 80 g 0   Current Facility-Administered Medications  Medication Dose Route Frequency Provider Last Rate Last Admin   methylPREDNISolone  acetate (DEPO-MEDROL ) injection 80 mg  80 mg Intramuscular Once          Past Surgical History:  Procedure Laterality Date   ABDOMINAL HYSTERECTOMY     None       Allergies  Allergen Reactions   Gabapentin      hallucination   Penicillins Nausea And Vomiting and Other (See Comments)    Patient slept for 2 days.  Has patient had a PCN reaction causing immediate rash, facial/tongue/throat swelling, SOB or lightheadedness with hypotension: No Has patient had a PCN reaction causing severe rash involving mucus membranes or skin necrosis: No Has patient had a PCN reaction that required hospitalization No Has patient had a PCN reaction occurring within the last 10 years: No If all of the above answers are NO, then may proceed with Cephalosporin use.       Family History  Problem Relation Age of Onset   Deep vein thrombosis Mother    Emphysema Father 21     Social History Ms. Hemberger reports that she has never smoked. She has never used smokeless tobacco. Ms. Kingdon reports no history of alcohol use.   Review of Systems CONSTITUTIONAL: No weight loss, fever, chills,  weakness or fatigue.  HEENT: Eyes: No visual loss, blurred vision, double vision or yellow sclerae.No hearing loss, sneezing, congestion, runny nose or sore throat.  SKIN: No rash or itching.  CARDIOVASCULAR:  RESPIRATORY: No shortness of breath, cough or sputum.  GASTROINTESTINAL: No anorexia, nausea, vomiting or diarrhea. No abdominal pain or blood.  GENITOURINARY: No burning on urination, no polyuria NEUROLOGICAL: No headache, dizziness, syncope, paralysis, ataxia, numbness or tingling in the extremities. No change in bowel or bladder control.  MUSCULOSKELETAL: No  muscle, back pain, joint pain or stiffness.  LYMPHATICS: No enlarged nodes. No history of splenectomy.  PSYCHIATRIC: No history of depression or anxiety.  ENDOCRINOLOGIC: No reports of sweating, cold or heat intolerance. No polyuria or polydipsia.  SABRA   Physical Examination There were no vitals filed for this visit. There were no vitals filed for this visit.  Gen: resting comfortably, no acute distress HEENT: no scleral icterus, pupils equal round and reactive, no palptable cervical adenopathy,  CV Resp: Clear to auscultation bilaterally GI: abdomen is soft, non-tender, non-distended, normal bowel sounds, no hepatosplenomegaly MSK: extremities are warm, no edema.  Skin: warm, no rash Neuro:  no focal deficits Psych: appropriate affect   Diagnostic Studies  05/2023 echo 1. Left ventricular ejection fraction, by estimation, is 60 to 65%. The  left ventricle has normal function. The left ventricle has no regional  wall motion abnormalities. Left ventricular diastolic parameters are  indeterminate.   2. Right ventricular systolic function is normal. The right ventricular  size is normal. There is normal pulmonary artery systolic pressure.   3. The mitral valve is normal in structure. Mild mitral valve  regurgitation. No evidence of mitral stenosis.   4. The tricuspid valve is abnormal.   5. The aortic valve is tricuspid. Aortic valve regurgitation is mild. No  aortic stenosis is present.   6. The inferior vena cava is normal in size with greater than 50%  respiratory variability, suggesting right atrial pressure of 3 mmHg.    Assessment and Plan   1.Afib - no symptoms, she had self converted at last f/u. Normal rhythm by exam today - CHADS2Vasc score is just 2, class IIA recommendation to consider anticoagulation. She initially had elected to start, at this time comfortable discontinuing but understands if develops additional risk factor in the future would need to reconsider.     2. HTN - elevated here, she will call Monday with home bp's - if elevated would increase her diltiazem      Dorn PHEBE Ross, M.D., F.A.C.C.

## 2024-07-04 NOTE — Telephone Encounter (Signed)
 Refilled diltiazem ,has f/u apt in september

## 2024-07-04 NOTE — Telephone Encounter (Signed)
 She may need refill on Diltiazem 

## 2024-07-05 ENCOUNTER — Encounter: Payer: Self-pay | Admitting: Family Medicine

## 2024-07-05 ENCOUNTER — Ambulatory Visit: Admitting: Family Medicine

## 2024-07-05 VITALS — BP 127/74 | HR 88 | Temp 98.4°F | Ht 65.0 in | Wt 147.8 lb

## 2024-07-05 DIAGNOSIS — M1732 Unilateral post-traumatic osteoarthritis, left knee: Secondary | ICD-10-CM

## 2024-07-05 DIAGNOSIS — M5136 Other intervertebral disc degeneration, lumbar region with discogenic back pain only: Secondary | ICD-10-CM | POA: Diagnosis not present

## 2024-07-05 DIAGNOSIS — C44311 Basal cell carcinoma of skin of nose: Secondary | ICD-10-CM

## 2024-07-05 DIAGNOSIS — G8929 Other chronic pain: Secondary | ICD-10-CM

## 2024-07-05 DIAGNOSIS — Z0000 Encounter for general adult medical examination without abnormal findings: Secondary | ICD-10-CM

## 2024-07-05 DIAGNOSIS — I10 Essential (primary) hypertension: Secondary | ICD-10-CM | POA: Diagnosis not present

## 2024-07-05 DIAGNOSIS — Z0001 Encounter for general adult medical examination with abnormal findings: Secondary | ICD-10-CM

## 2024-07-05 DIAGNOSIS — M545 Low back pain, unspecified: Secondary | ICD-10-CM | POA: Diagnosis not present

## 2024-07-05 DIAGNOSIS — I480 Paroxysmal atrial fibrillation: Secondary | ICD-10-CM

## 2024-07-05 DIAGNOSIS — Z6379 Other stressful life events affecting family and household: Secondary | ICD-10-CM | POA: Diagnosis not present

## 2024-07-05 DIAGNOSIS — E559 Vitamin D deficiency, unspecified: Secondary | ICD-10-CM

## 2024-07-05 MED ORDER — OXYCODONE-ACETAMINOPHEN 10-325 MG PO TABS
0.5000 | ORAL_TABLET | Freq: Three times a day (TID) | ORAL | 0 refills | Status: DC | PRN
Start: 2024-08-04 — End: 2024-10-06

## 2024-07-05 MED ORDER — OXYCODONE-ACETAMINOPHEN 10-325 MG PO TABS
0.5000 | ORAL_TABLET | Freq: Three times a day (TID) | ORAL | 0 refills | Status: DC | PRN
Start: 2024-07-05 — End: 2024-10-06

## 2024-07-05 MED ORDER — OXYCODONE-ACETAMINOPHEN 10-325 MG PO TABS
0.5000 | ORAL_TABLET | Freq: Three times a day (TID) | ORAL | 0 refills | Status: DC | PRN
Start: 2024-09-02 — End: 2024-10-06

## 2024-07-05 NOTE — Patient Instructions (Signed)
 Basal Cell Carcinoma Basal cell carcinoma is the most common form of skin cancer. It begins in the basal cells, which are at the bottom of the outer skin layer (epidermis). Basal cell carcinoma can often be cured. It rarely spreads to other areas of the body (metastasizes). It may come back at the same location (recur), but it can be treated again if this happens. Basal cell carcinoma occurs most often on parts of the body that are frequently exposed to the sun, such as: Parts of the head, including the scalp or face. Ears. Neck. Arms or legs. Backs of the hands. What are the causes? This condition is usually caused by exposure to ultraviolet (UV) light. UV light may come from the sun or from tanning beds. Other causes include: Exposure to arsenic, a highly poisonous metal. Exposure to high-energy X-rays (radiation). Exposure to toxic tars and oils. Certain genetic conditions, such as a condition that makes a person sensitive to sunlight. What increases the risk? You are more likely to develop this condition if: You are older than 62 years of age. You have: Fair skin (light complexion), blond or red hair, or blue, green, or gray eye color. Childhood freckling. Had repeated sunburns or sun exposure over long periods of time, especially during childhood. A weakened body defense system (immune system). Been exposed to certain chemicals, such as tar, soot, and arsenic. Chronic inflammatory conditions or infections. A family or personal history of basal cell carcinoma. You use tanning beds. What are the signs or symptoms? The main symptom of this condition is a growth or lesion on the skin. The shape and color of the growth or lesion may vary. The main types include: An open sore that may remain open for three weeks or longer. The sore may bleed or crust. This type of lesion can be an early sign of basal cell carcinoma. Basal cell carcinoma often shows up as a sore that does not heal. A  reddish area that may crust, itch, or cause discomfort. This may occur on areas that are exposed to the sun. These patches might be easier to feel than to see. A shiny or clear bump that is red, white, or pink. In people who have dark hair, the bump is often tan, black, or brown. These bumps can look like moles. A pink growth with a raised border. The growth will have a crusted and indented area in the center. Small blood vessels may appear on the surface of the growth as it gets bigger. A scar-like area that looks like shiny, stretched skin. The area may be white, yellow, or waxy. It often has irregular borders. This may be a sign of more aggressive basal cell carcinoma. How is this diagnosed? This condition may be diagnosed with: A physical exam. Removal of a tissue sample to be examined under a microscope (biopsy). How is this treated? Treatment for this condition involves removing the cancerous tissue. The method that is used for this depends on the type, size, location, and number of tumors. Possible treatments include: Surgery, such as: Mohs surgery. In this procedure, the cancerous skin cells are removed layer by layer until all of the tumor has been removed. Surgical removal (excision) of the tumor. This involves removing the entire tumor and a small amount of normal skin that surrounds it. Cryosurgery. This involves freezing the tumor with liquid nitrogen. Plastic surgery. The tumor is removed, and healthy skin from another part of the body is used to cover the wound. This  may be done for large tumors that are in areas where it is not possible to stretch the nearby skin to sew the edges of the wound together. Therapies or treatments, such as: Radiation. This may be used for tumors on the face. Photodynamic therapy. A chemical cream is applied to the skin, and light exposure is used to activate the chemical. Electrodesiccation and curettage. This involves alternately scraping and burning  the tumor while using an electric current to control bleeding. Chemical treatments, such as imiquimod cream and interferon injections. These may be used to remove superficial tumors with minimal scarring. Follow these instructions at home: Avoid direct exposure to the sun. Do self-exams as told by your health care provider. Look for new spots or changes in your skin. Keep all follow-up visits. This is important. How is this prevented?  Avoid the sun when it is the strongest. This is usually between 10 a.m. and 4 p.m. When you are out in the sun, use a sunscreen that has a sun protection factor (SPF) of at least 30. Apply sunscreen at least 30 minutes before exposure to the sun. Reapply sunscreen every 2-4 hours while you are outside. Also reapply it after swimming and after excessive sweating. Always wear hats, protective clothing, and UV-blocking sunglasses when you are outdoors. Do not use tanning beds. Contact a health care provider if: You notice any new spots or any changes in your skin. You have had a basal cell carcinoma tumor removed, and you notice a new growth in the same location. Get help right away if: You have a spot that is sore and does not heal. You have a spot that bleeds easily. Summary Basal cell carcinoma is the most common form of skin cancer. It begins in the bottom of the outer skin layer (epidermis). Basal cell carcinoma can almost always be cured. This condition is usually caused by exposure to ultraviolet (UV) light. It mostly affects the face, scalp, neck, ears, arms, legs, or backs of the hands. The main symptom of this condition is a growth or lesion on the skin that can vary in shape and color. You can prevent this cancer by avoiding direct exposure to the sun, applying sunscreen of at least 30 SPF, and wearing protective clothing. Apply sunscreen 30 minutes before you go out into the sun, and reapply every 2-4 hours while you are outside. This information is  not intended to replace advice given to you by your health care provider. Make sure you discuss any questions you have with your health care provider. Document Revised: 03/20/2021 Document Reviewed: 03/20/2021 Elsevier Patient Education  2024 [employer].

## 2024-07-05 NOTE — Progress Notes (Signed)
 Ashley Marquez is a 63 y.o. female presents to office today for annual physical exam examination.    Concerns today include: 1.  She has been having increased stress at home.  Apparently her daughter got into some legal trouble and her grandson was taken out of the home.  She is fighting to get him back and obtain full custody of him.  She is supposed to get some cataract surgery done on her right eye in September.  Occupation: daycare, Marital status: single, Substance use: none Health Maintenance Due  Topic Date Due   INFLUENZA VACCINE  06/30/2024   Refills needed today: all  There is no immunization history for the selected administration types on file for this patient. Past Medical History:  Diagnosis Date   Anxiety    Heart murmur    Social History   Socioeconomic History   Marital status: Divorced    Spouse name: Not on file   Number of children: 2   Years of education: Not on file   Highest education level: Not on file  Occupational History    Comment: [employer]  Tobacco Use   Smoking status: Never   Smokeless tobacco: Never  Vaping Use   Vaping status: Never Used  Substance and Sexual Activity   Alcohol use: No   Drug use: No   Sexual activity: Not Currently  Other Topics Concern   Not on file  Social History Narrative   Not on file   Social Drivers of Health   Financial Resource Strain: Not on file  Food Insecurity: No Food Insecurity (03/17/2024)   Received from [facility]   Hunger Vital Sign    Within the past 12 months, you worried that your food would run out before you got the money to buy more.: Never true    Within the past 12 months, the food you bought just didn't last and you didn't have money to get more.: Never true  Transportation Needs: No Transportation Needs (03/17/2024)   Received from [facility]   PRAPARE - Transportation    Lack of Transportation (Medical): No    Lack of Transportation (Non-Medical): No  Physical  Activity: Not on file  Stress: Not on file  Social Connections: Not on file  Intimate Partner Violence: Not At Risk (06/01/2023)   Humiliation, Afraid, Rape, and Kick questionnaire    Fear of Current or Ex-Partner: No    Emotionally Abused: No    Physically Abused: No    Sexually Abused: No   Past Surgical History:  Procedure Laterality Date   ABDOMINAL HYSTERECTOMY     None     Family History  Problem Relation Age of Onset   Deep vein thrombosis Mother    Emphysema Father 61    Current Outpatient Medications:    albuterol  (VENTOLIN  HFA) 108 (90 Base) MCG/ACT inhaler, Inhale 2 puffs into the lungs every 6 (six) hours as needed for wheezing or shortness of breath., Disp: 8 g, Rfl: 0   diltiazem  (CARDIZEM  CD) 180 MG 24 hr capsule, Take 1 capsule (180 mg total) by mouth daily., Disp: 90 capsule, Rfl: 1   omeprazole  (PRILOSEC) 20 MG capsule, Take 1 capsule (20 mg total) by mouth daily., Disp: 90 capsule, Rfl: 3   triamcinolone  cream (KENALOG ) 0.1 %, Apply 1 Application topically 2 (two) times daily as needed (poison oak)., Disp: 80 g, Rfl: 0   [START ON 08/04/2024] oxyCODONE -acetaminophen  (PERCOCET) 10-325 MG tablet, Take 0.5-1 tablets by mouth  every 8 (eight) hours as needed for pain., Disp: 75 tablet, Rfl: 0   [START ON 09/02/2024] oxyCODONE -acetaminophen  (PERCOCET) 10-325 MG tablet, Take 0.5-1 tablets by mouth every 8 (eight) hours as needed for pain., Disp: 75 tablet, Rfl: 0   oxyCODONE -acetaminophen  (PERCOCET) 10-325 MG tablet, Take 0.5-1 tablets by mouth every 8 (eight) hours as needed for pain., Disp: 75 tablet, Rfl: 0  Allergies  Allergen Reactions   Gabapentin      hallucination   Penicillins Nausea And Vomiting and Other (See Comments)    Patient slept for 2 days.  Has patient had a PCN reaction causing immediate rash, facial/tongue/throat swelling, SOB or lightheadedness with hypotension: No Has patient had a PCN reaction causing severe rash involving mucus membranes or skin  necrosis: No Has patient had a PCN reaction that required hospitalization No Has patient had a PCN reaction occurring within the last 10 years: No If all of the above answers are NO, then may proceed with Cephalosporin use.      ROS: Review of Systems A comprehensive review of systems was negative except for: Eyes: positive for cataracts Integument/breast: positive for skin lesion(s) and on nose Musculoskeletal: positive for back pain Behavioral/Psych: positive for anxiety    Physical exam BP 127/74   Pulse 88   Temp 98.4 F (36.9 C) (Temporal)   Ht 5' 5 (1.651 m)   Wt 147 lb 12.8 oz (67 kg)   SpO2 99%   BMI 24.60 kg/m  General appearance: alert, cooperative, appears stated age, and no distress Head: Normocephalic, without obvious abnormality, atraumatic Eyes: negative findings: lids and lashes normal, conjunctivae and sclerae normal, corneas clear, and pupils equal, round, reactive to light and accomodation Ears: normal TM's and external ear canals both ears Nose: Nares normal. Septum midline. Mucosa normal. No drainage or sinus tenderness.  She has a lesion on the right lateral bridge of her nose that appears to be consistent with basal cell carcinoma as it has central umbilication and rolled borders Throat: lips, mucosa, and tongue normal; teeth and gums normal Neck: no adenopathy, no carotid bruit, no JVD, supple, symmetrical, trachea midline, and thyroid  not enlarged, symmetric, no tenderness/mass/nodules Back: symmetric, no curvature. ROM normal. No CVA tenderness. Lungs: clear to auscultation bilaterally Heart: regular rate and rhythm, S1, S2 normal, no murmur, click, rub or gallop Abdomen: soft, non-tender; bowel sounds normal; no masses,  no organomegaly Extremities: extremities normal, atraumatic, no cyanosis or edema Pulses: 2+ and symmetric Skin: Lesion as above.  Multiple areas of purpura on the forearms Lymph nodes: Cervical, supraclavicular, and axillary nodes  normal. Neurologic: Grossly normal           07/05/2024    8:15 AM 03/29/2024    2:12 PM 12/20/2023    3:24 PM  Depression screen PHQ 2/9  Decreased Interest 0 0 0  Down, Depressed, Hopeless 0 0 0  PHQ - 2 Score 0 0 0  Altered sleeping  0 0  Tired, decreased energy  0 0  Change in appetite  0 0  Feeling bad or failure about yourself   0 0  Trouble concentrating  0 0  Moving slowly or fidgety/restless  0 0  Suicidal thoughts  0 0  PHQ-9 Score  0 0  Difficult doing work/chores  Not difficult at all Not difficult at all      03/29/2024    2:11 PM 12/20/2023    3:24 PM 09/13/2023    3:57 PM 07/26/2023   10:44 AM  GAD  7 : Generalized Anxiety Score  Nervous, Anxious, on Edge 0 0 0 0  Control/stop worrying 0 0 0 0  Worry too much - different things 0 0 0 0  Trouble relaxing 0 0 0 0  Restless 0 0 0 0  Easily annoyed or irritable 0 0 0 0  Afraid - awful might happen 0 0 0 0  Total GAD 7 Score 0 0 0 0  Anxiety Difficulty Not difficult at all Not difficult at all Not difficult at all Not difficult at all     Assessment/ Plan: Nena [employer] here for annual physical exam.   Annual physical exam  Basal cell carcinoma (BCC) of nasal sidewall - Plan: Ambulatory referral to Dermatology  Stress due to illness of family member - Plan: CMP14+EGFR  Vitamin D  deficiency - Plan: CMP14+EGFR, VITAMIN D  25 Hydroxy (Vit-D Deficiency, Fractures)  Essential hypertension - Plan: CMP14+EGFR, Lipid Panel  Paroxysmal atrial fibrillation (HCC) - Plan: CMP14+EGFR, Lipid Panel, CBC, TSH  Degeneration of intervertebral disc of lumbar region with discogenic back pain - Plan: oxyCODONE -acetaminophen  (PERCOCET) 10-325 MG tablet, oxyCODONE -acetaminophen  (PERCOCET) 10-325 MG tablet, oxyCODONE -acetaminophen  (PERCOCET) 10-325 MG tablet, CMP14+EGFR  Post-traumatic osteoarthritis of left knee - Plan: oxyCODONE -acetaminophen  (PERCOCET) 10-325 MG tablet, oxyCODONE -acetaminophen  (PERCOCET) 10-325 MG tablet,  oxyCODONE -acetaminophen  (PERCOCET) 10-325 MG tablet, CMP14+EGFR  Chronic right-sided low back pain without sciatica - Plan: oxyCODONE -acetaminophen  (PERCOCET) 10-325 MG tablet, oxyCODONE -acetaminophen  (PERCOCET) 10-325 MG tablet, oxyCODONE -acetaminophen  (PERCOCET) 10-325 MG tablet, CMP14+EGFR  Referral to dermatology placed for evaluation of lesion on nose which I believed to be a basal cell carcinoma based on rolled borders and central umbilication.  Having ongoing stress due to mother's ongoing decline and now recently with her grandson/daughter.  Nonfasting labs were collected today.  She seemed to be both rate and rhythm controlled on exam  Percocet renewed.  She is up-to-date on UDS and CSC and the national narcotic database was reviewed with no red flags  Counseled on healthy lifestyle choices, including diet (rich in fruits, vegetables and lean meats and low in salt and simple carbohydrates) and exercise (at least 30 minutes of moderate physical activity daily).  Patient to follow up 32m for pain  Florenda Watt M. Jolinda, DO

## 2024-07-06 ENCOUNTER — Ambulatory Visit: Payer: Self-pay | Admitting: Family Medicine

## 2024-07-06 ENCOUNTER — Other Ambulatory Visit: Payer: Self-pay | Admitting: Family Medicine

## 2024-07-06 DIAGNOSIS — E559 Vitamin D deficiency, unspecified: Secondary | ICD-10-CM

## 2024-07-06 DIAGNOSIS — E7800 Pure hypercholesterolemia, unspecified: Secondary | ICD-10-CM

## 2024-07-06 LAB — CMP14+EGFR
ALT: 21 IU/L (ref 0–32)
AST: 19 IU/L (ref 0–40)
Albumin: 4.5 g/dL (ref 3.9–4.9)
Alkaline Phosphatase: 113 IU/L (ref 44–121)
BUN/Creatinine Ratio: 19 (ref 12–28)
BUN: 13 mg/dL (ref 8–27)
Bilirubin Total: 0.4 mg/dL (ref 0.0–1.2)
CO2: 21 mmol/L (ref 20–29)
Calcium: 9.3 mg/dL (ref 8.7–10.3)
Chloride: 103 mmol/L (ref 96–106)
Creatinine, Ser: 0.69 mg/dL (ref 0.57–1.00)
Globulin, Total: 3 g/dL (ref 1.5–4.5)
Glucose: 116 mg/dL — ABNORMAL HIGH (ref 70–99)
Potassium: 3.3 mmol/L — ABNORMAL LOW (ref 3.5–5.2)
Sodium: 141 mmol/L (ref 134–144)
Total Protein: 7.5 g/dL (ref 6.0–8.5)
eGFR: 98 mL/min/1.73 (ref >59)

## 2024-07-06 LAB — LIPID PANEL
Chol/HDL Ratio: 6.1 ratio — ABNORMAL HIGH (ref 0.0–4.4)
Cholesterol, Total: 238 mg/dL — ABNORMAL HIGH (ref 100–199)
HDL: 39 mg/dL — ABNORMAL LOW (ref >39)
LDL Chol Calc (NIH): 174 mg/dL — ABNORMAL HIGH (ref 0–99)
Triglycerides: 137 mg/dL (ref 0–149)
VLDL Cholesterol Cal: 25 mg/dL (ref 5–40)

## 2024-07-06 LAB — TSH: TSH: 2.11 u[IU]/mL (ref 0.450–4.500)

## 2024-07-06 LAB — CBC
Hematocrit: 39.7 % (ref 34.0–46.6)
Hemoglobin: 13 g/dL (ref 11.1–15.9)
MCH: 31.9 pg (ref 26.6–33.0)
MCHC: 32.7 g/dL (ref 31.5–35.7)
MCV: 98 fL — ABNORMAL HIGH (ref 79–97)
Platelets: 366 x10E3/uL (ref 150–450)
RBC: 4.07 x10E6/uL (ref 3.77–5.28)
RDW: 12.6 % (ref 11.7–15.4)
WBC: 6.7 x10E3/uL (ref 3.4–10.8)

## 2024-07-06 LAB — VITAMIN D 25 HYDROXY (VIT D DEFICIENCY, FRACTURES): Vit D, 25-Hydroxy: 27.6 ng/mL — AB (ref 30.0–100.0)

## 2024-07-06 MED ORDER — ROSUVASTATIN CALCIUM 10 MG PO TABS: 10.0000 mg | ORAL_TABLET | Freq: Every day | ORAL | 3 refills | Status: AC

## 2024-07-06 MED ORDER — VITAMIN D (ERGOCALCIFEROL) 1.25 MG (50000 UNIT) PO CAPS
50000.0000 [IU] | ORAL_CAPSULE | ORAL | 3 refills | Status: AC
Start: 2024-07-06 — End: ?

## 2024-07-24 ENCOUNTER — Encounter: Payer: Self-pay | Admitting: Nurse Practitioner

## 2024-07-24 ENCOUNTER — Ambulatory Visit: Attending: Nurse Practitioner | Admitting: Nurse Practitioner

## 2024-07-24 VITALS — BP 112/70 | HR 62 | Ht 65.0 in | Wt 150.0 lb

## 2024-07-24 DIAGNOSIS — I10 Essential (primary) hypertension: Secondary | ICD-10-CM | POA: Insufficient documentation

## 2024-07-24 DIAGNOSIS — I4891 Unspecified atrial fibrillation: Secondary | ICD-10-CM | POA: Diagnosis not present

## 2024-07-24 DIAGNOSIS — I480 Paroxysmal atrial fibrillation: Secondary | ICD-10-CM | POA: Insufficient documentation

## 2024-07-24 DIAGNOSIS — Z01810 Encounter for preprocedural cardiovascular examination: Secondary | ICD-10-CM | POA: Insufficient documentation

## 2024-07-24 NOTE — Progress Notes (Unsigned)
 Cardiology Office Note   Date:  07/24/2024 ID:  Ashley Marquez, DOB 10/01/1961, MRN [id number] PCP: Jolinda Norene HERO, DO  [facility] Providers Cardiologist:  Alvan Carrier, MD     History of Present Illness Ashley Marquez is a 63 y.o. female with a PMH of PAF, HTN, and anxiety, who presents today for scheduled follow-up and preop clearance.   Last seen by Dr. Alvan on December 15, 2023. Was overall doing well at the time.   Today she presents for regular follow-up.  She is doing well.  Denies any acute cardiac complaints or issues. Denies any chest pain, shortness of breath, palpitations, syncope, presyncope, dizziness, orthopnea, PND, swelling or significant weight changes, acute bleeding, or claudication.  She is also pending future eye surgery.  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  ROS: Negative.  See HPI.  Studies Reviewed  EKG:  EKG Interpretation Date/Time:  Monday July 24 2024 13:02:28 EDT Ventricular Rate:  61 PR Interval:  136 QRS Duration:  76 QT Interval:  416 QTC Calculation: 418 R Axis:   39  Text Interpretation: Normal sinus rhythm Nonspecific ST and T wave abnormality When compared with ECG of 18-Jun-2023 15:31, No significant change was found Confirmed by Miriam Norris [id number]) on 07/24/2024 1:14:12 PM   Echo 05/2023:  1. Left ventricular ejection fraction, by estimation, is 60 to 65%. The  left ventricle has normal function. The left ventricle has no regional  wall motion abnormalities. Left ventricular diastolic parameters are  indeterminate.   2. Right ventricular systolic function is normal. The right ventricular  size is normal. There is normal pulmonary artery systolic pressure.   3. The mitral valve is normal in structure. Mild mitral valve  regurgitation. No evidence of mitral stenosis.   4. The tricuspid valve is abnormal.   5. The aortic valve is tricuspid. Aortic valve regurgitation is mild. No  aortic stenosis is present.   6. The  inferior vena cava is normal in size with greater than 50%  respiratory variability, suggesting right atrial pressure of 3 mmHg.   FINDINGS   Left Ventricle: LVOT pulse wave Doppler was not performed. Left  ventricular ejection fraction, by estimation, is 60 to 65%. The left  ventricle has normal function. The left ventricle has no regional wall  motion abnormalities. The left ventricular  internal cavity size was normal in size. There is no left ventricular  hypertrophy. Left ventricular diastolic parameters are indeterminate.  Risk Assessment/Calculations     The 10-year ASCVD risk score (Arnett DK, et al., 2019) is: 5.7%   Values used to calculate the score:     Age: 73 years     Clincally relevant sex: Female     Is Non-Hispanic African American: No     Diabetic: No     Tobacco smoker: No     Systolic Blood Pressure: 112 mmHg     Is BP treated: Yes     HDL Cholesterol: 39 mg/dL     Total Cholesterol: 238 mg/dL      Physical Exam VS:  BP 112/70   Pulse 62   Ht 5' 5 (1.651 m)   Wt 150 lb (68 kg)   SpO2 99%   BMI 24.96 kg/m        Wt Readings from Last 3 Encounters:  07/24/24 150 lb (68 kg)  07/05/24 147 lb 12.8 oz (67 kg)  03/29/24 151 lb 6.4 oz (68.7 kg)    GEN: Well nourished, well developed  in no acute distress NECK: No JVD; No carotid bruits CARDIAC: S1/S2, RRR, no murmurs, rubs, gallops RESPIRATORY:  Clear to auscultation without rales, wheezing or rhonchi  ABDOMEN: Soft, non-tender, non-distended EXTREMITIES:  No edema; No deformity   ASSESSMENT AND PLAN  Preoperative cardiovascular risk assessment Ashley Marquez's perioperative risk of a major cardiac event is 0.4% according to the Revised Cardiac Risk Index (RCRI).  Therefore, she is at low risk for perioperative complications.   Her functional capacity is excellent at 8.23 METs according to the Duke Activity Status Index (DASI). Recommendations: According to ACC/AHA guidelines, no further cardiovascular  testing needed.  The patient may proceed to surgery at acceptable risk.   Antiplatelet and/or Anticoagulation Recommendations: She is not on any antiplatelet/anticoagulation medication that needs to be held prior to procedure.  PAF Denies any tachycardia or palpitations.  EKG today reveals normal sinus rhythm.  At last office visit, patient had initially elected to start OAC but at the time when she was comfortable with discontinuing medication but understood if she developed additional risk factor in the future, would need to reconsider. Agree with Dr. Ranae previous recommendations that if additional risk factor develops in the future, would need to reconsider. Continue diltiazem .   HTN   BP stable and at goal. Discussed to monitor BP at home at least 2 hours after medications and sitting for 5-10 minutes.  No medication changes at this time. Heart healthy diet and regular cardiovascular exercise encouraged.    I spent a total duration of 20 minutes reviewing prior notes, reviewing outside records including  labs, EKG today, face-to-face counseling of  medical condition, pathophysiology, evaluation, management, and documenting the findings in the note.  Dispo: Follow-up with MD/APP in 1 year or sooner if anything changes.  Signed, Almarie Crate, NP

## 2024-07-24 NOTE — Patient Instructions (Addendum)

## 2024-08-09 ENCOUNTER — Other Ambulatory Visit (HOSPITAL_COMMUNITY): Payer: Self-pay

## 2024-08-09 ENCOUNTER — Telehealth: Payer: Self-pay

## 2024-08-09 NOTE — Telephone Encounter (Signed)
 Pharmacy Patient Advocate Encounter   Received notification from Onbase that prior authorization for oxyCODONE -Acetaminophen  10-325MG  tablets  is required/requested.   Insurance verification completed.   The patient is insured through [facility] MEDICAID .   Per test claim: PA required; PA submitted to above mentioned insurance via Latent Key/confirmation #/EOC [id number] Status is pending

## 2024-08-09 NOTE — Telephone Encounter (Signed)
 Pharmacy Patient Advocate Encounter  Received notification from [facility] MEDICAID that Prior Authorization for oxyCODONE -Acetaminophen  10-325MG  tablets  has been APPROVED from 08/09/24 to 02/06/25. Ran test claim, Copay is $4. This test claim was processed through [facility]- copay amounts may vary at other pharmacies due to pharmacy/plan contracts, or as the patient moves through the different stages of their insurance plan.   PA #/Case ID/Reference #: [id number]  *Spoke with [facility] to process

## 2024-08-14 ENCOUNTER — Ambulatory Visit: Admitting: Dermatology

## 2024-08-18 ENCOUNTER — Ambulatory Visit: Admitting: Nurse Practitioner

## 2024-08-18 DIAGNOSIS — H25811 Combined forms of age-related cataract, right eye: Secondary | ICD-10-CM | POA: Diagnosis not present

## 2024-08-30 DIAGNOSIS — J40 Bronchitis, not specified as acute or chronic: Secondary | ICD-10-CM | POA: Diagnosis not present

## 2024-09-04 ENCOUNTER — Ambulatory Visit: Payer: Self-pay

## 2024-09-04 NOTE — Telephone Encounter (Signed)
 It is office policy to have an OV for abx. Sadly, I cannot make an exception for anyone

## 2024-09-04 NOTE — Telephone Encounter (Signed)
 Please review and advise.

## 2024-09-04 NOTE — Telephone Encounter (Signed)
 Pt triaged today by another nurse.     Copied from CRM #[id number]. Topic: Clinical - Red Word Triage >> Sep 04, 2024 10:33 AM Ashley Marquez wrote: Red Word that prompted transfer to Nurse Triage: pt returning NT call

## 2024-09-04 NOTE — Telephone Encounter (Signed)
 FYI Only or Action Required?: FYI only for provider.  Patient was last seen in primary care on 07/05/2024 by Jolinda Norene HERO, DO.  Called Nurse Triage reporting Cough.  Symptoms began Wednesday.  Interventions attempted: OTC medications: coricidin HBP Prednisone  and Tessalon .  Symptoms are: unchanged.  Triage Disposition: See Physician Within 24 Hours  Patient/caregiver understands and will follow disposition?: No, refuses disposition  Advised pt that will need OV for abx. Pt stated that she is working and that she wont get off until 1730. Asked pt if she could do virtual appt and she said no. She stated Ashly knows me well.         Reason for Disposition  SEVERE coughing spells (e.g., whooping sound after coughing, vomiting after coughing)  Answer Assessment - Initial Assessment Questions 1. ONSET: When did the cough begin?      Last Sunday  2. SEVERITY: How bad is the cough today?      Keeping pt up at night  3. SPUTUM: Describe the color of your sputum (e.g., none, dry cough; clear, [place], yellow, green)     Green  4. HEMOPTYSIS: Are you coughing up any blood? If Yes, ask: How much? (e.g., flecks, streaks, tablespoons, etc.)     no 5. DIFFICULTY BREATHING: Are you having difficulty breathing? If Yes, ask: How bad is it? (e.g., mild, moderate, severe)      no 6. FEVER: Do you have a fever? If Yes, ask: What is your temperature, how was it measured, and when did it start?     no 7. CARDIAC HISTORY: Do you have any history of heart disease? (e.g., heart attack, congestive heart failure)      *No Answer* 8. LUNG HISTORY: Do you have any history of lung disease?  (e.g., pulmonary embolus, asthma, emphysema)     bronchitis  10. OTHER SYMPTOMS: Do you have any other symptoms? (e.g., runny nose, wheezing, chest pain)       Scratchy throat  Protocols used: Cough - Acute Productive-A-AH

## 2024-09-04 NOTE — Telephone Encounter (Signed)
 Called to see if patient wanted to come in at 3:45 today or have a video visit at that time with Dr. Jolinda. If patient calls back please offer that slot.

## 2024-09-04 NOTE — Telephone Encounter (Signed)
 Message from [employer] [facility] sent at 09/04/2024  9:05 AM EDT  Summary: worsening cough   Recently went to Urgent Care but medications are not working. Still coughing ,, worse at night.  Declined making an appt

## 2024-10-06 ENCOUNTER — Encounter: Payer: Self-pay | Admitting: Family Medicine

## 2024-10-06 ENCOUNTER — Ambulatory Visit: Payer: Self-pay | Admitting: Family Medicine

## 2024-10-06 VITALS — BP 136/77 | HR 71 | Temp 98.1°F | Ht 65.0 in | Wt 151.5 lb

## 2024-10-06 DIAGNOSIS — M1732 Unilateral post-traumatic osteoarthritis, left knee: Secondary | ICD-10-CM

## 2024-10-06 DIAGNOSIS — Z6379 Other stressful life events affecting family and household: Secondary | ICD-10-CM

## 2024-10-06 DIAGNOSIS — G8929 Other chronic pain: Secondary | ICD-10-CM

## 2024-10-06 DIAGNOSIS — M5136 Other intervertebral disc degeneration, lumbar region with discogenic back pain only: Secondary | ICD-10-CM | POA: Diagnosis not present

## 2024-10-06 MED ORDER — OXYCODONE-ACETAMINOPHEN 10-325 MG PO TABS: 0.5000 | ORAL_TABLET | Freq: Three times a day (TID) | ORAL | 0 refills | Status: DC | PRN

## 2024-10-06 NOTE — Progress Notes (Signed)
 Subjective: CC: Follow-up chronic pain  PCP: Jolinda Norene HERO, DO YEP:Djwimj Ashley Marquez is a 63 y.o. female presenting to clinic today for:  Degenerative back disease is under moderate control with opioid.  She denies excessive daytime sedation, falls, respiratory depression or constipation  Her biggest issue right now is the stress that she has been dealing with related to her mother, who is under hospice care and she think she is nearing the end.  She continues to work but feels pulled in multiple directions because she notes that the car that she bought earlier this year is already having issues and she has contacted the cellar about it being a limb in.  She also reports that her daughter Ashley Marquez is now out of the house.  Sadly, her grandson Ashley Marquez, was taken out of her daughter's custody.  She is hoping that she herself can gain custody of him.  She has been trying to be strong.  However, she does admit that a lot of this is weighing heavy on her.  She has access to hospice postoral care but notes that she is so busy that she just has not had time to make for herself.  She did have her eye exam/surgery done since her last visit but has yet to take the suspected basal cell carcinoma off the nose because of everything that is going on with her mom   ROS: Per HPI  Allergies  Allergen Reactions   Gabapentin      hallucination   Penicillins Nausea And Vomiting and Other (See Comments)    Patient slept for 2 days.  Has patient had a PCN reaction causing immediate rash, facial/tongue/throat swelling, SOB or lightheadedness with hypotension: No Has patient had a PCN reaction causing severe rash involving mucus membranes or skin necrosis: No Has patient had a PCN reaction that required hospitalization No Has patient had a PCN reaction occurring within the last 10 years: No If all of the above answers are NO, then may proceed with Cephalosporin use.    Past Medical History:  Diagnosis  Date   Anxiety    Atrial fibrillation with RVR (HCC) 06/01/2023   Chest pain 06/08/2011   IMO SNOMED Dx Update Oct 2024     Heart murmur     Current Outpatient Medications:    albuterol  (VENTOLIN  HFA) 108 (90 Base) MCG/ACT inhaler, Inhale 2 puffs into the lungs every 6 (six) hours as needed for wheezing or shortness of breath., Disp: 8 g, Rfl: 0   diltiazem  (CARDIZEM  CD) 180 MG 24 hr capsule, Take 1 capsule (180 mg total) by mouth daily., Disp: 90 capsule, Rfl: 1   omeprazole  (PRILOSEC) 20 MG capsule, Take 1 capsule (20 mg total) by mouth daily., Disp: 90 capsule, Rfl: 3   oxyCODONE -acetaminophen  (PERCOCET) 10-325 MG tablet, Take 0.5-1 tablets by mouth every 8 (eight) hours as needed for pain., Disp: 75 tablet, Rfl: 0   oxyCODONE -acetaminophen  (PERCOCET) 10-325 MG tablet, Take 0.5-1 tablets by mouth every 8 (eight) hours as needed for pain., Disp: 75 tablet, Rfl: 0   oxyCODONE -acetaminophen  (PERCOCET) 10-325 MG tablet, Take 0.5-1 tablets by mouth every 8 (eight) hours as needed for pain., Disp: 75 tablet, Rfl: 0   rosuvastatin  (CRESTOR ) 10 MG tablet, Take 1 tablet (10 mg total) by mouth daily., Disp: 90 tablet, Rfl: 3   triamcinolone  cream (KENALOG ) 0.1 %, Apply 1 Application topically 2 (two) times daily as needed (poison oak)., Disp: 80 g, Rfl: 0   Vitamin D , Ergocalciferol , (DRISDOL ) 1.25  MG (50000 UNIT) CAPS capsule, Take 1 capsule (50,000 Units total) by mouth every 7 (seven) days., Disp: 12 capsule, Rfl: 3 Social History   Socioeconomic History   Marital status: Divorced    Spouse name: Not on file   Number of children: 2   Years of education: Not on file   Highest education level: Not on file  Occupational History    Comment: [employer]  Tobacco Use   Smoking status: Never   Smokeless tobacco: Never  Vaping Use   Vaping status: Never Used  Substance and Sexual Activity   Alcohol use: No   Drug use: No   Sexual activity: Not Currently  Other Topics Concern   Not on file   Social History Narrative   Not on file   Social Drivers of Health   Financial Resource Strain: Not on file  Food Insecurity: No Food Insecurity (03/17/2024)   Received from [facility]   Hunger Vital Sign    Within the past 12 months, you worried that your food would run out before you got the money to buy more.: Never true    Within the past 12 months, the food you bought just didn't last and you didn't have money to get more.: Never true  Transportation Needs: No Transportation Needs (03/17/2024)   [facility]   PRAPARE - Transportation    Lack of Transportation (Medical): No    Lack of Transportation (Non-Medical): No  Physical Activity: Not on file  Stress: Not on file  Social Connections: Not on file  Intimate Partner Violence: Not At Risk (06/01/2023)   Humiliation, Afraid, Rape, and Kick questionnaire    Fear of Current or Ex-Partner: No    Emotionally Abused: No    Physically Abused: No    Sexually Abused: No   Family History  Problem Relation Age of Onset   Deep vein thrombosis Mother    Emphysema Father 63    Objective: Office vital signs reviewed. BP 136/77   Pulse 71   Temp 98.1 F (36.7 C)   Ht 5' 5 (1.651 m)   Wt 151 lb 8 oz (68.7 kg)   SpO2 97%   BMI 25.21 kg/m   Physical Examination:  General: Awake, alert, tired appearing.  Sad and intermittently tearful HEENT: Sclera Lack.  Moist mucous membranes Cardio: regular rate and rhythm, S1S2 heard, no murmurs appreciated Pulm: clear to auscultation bilaterally, no wheezes, rhonchi or rales; normal work of breathing on room air MSK: Ambulating independently with normal gait and station     07/05/2024    8:15 AM 03/29/2024    2:12 PM 12/20/2023    3:24 PM  Depression screen PHQ 2/9  Decreased Interest 0 0 0  Down, Depressed, Hopeless 0 0 0  PHQ - 2 Score 0 0 0  Altered sleeping  0 0  Tired, decreased energy  0 0  Change in appetite  0 0  Feeling bad or failure about yourself   0  0  Trouble concentrating  0 0  Moving slowly or fidgety/restless  0 0  Suicidal thoughts  0 0  PHQ-9 Score  0  0   Difficult doing work/chores  Not difficult at all Not difficult at all     Data saved with a previous flowsheet row definition      03/29/2024    2:11 PM 12/20/2023    3:24 PM 09/13/2023    3:57 PM 07/26/2023   10:44 AM  GAD 7 : Generalized Anxiety Score  Nervous, Anxious, on Edge 0 0 0 0  Control/stop worrying 0 0 0 0  Worry too much - different things 0 0 0 0  Trouble relaxing 0 0 0 0  Restless 0 0 0 0  Easily annoyed or irritable 0 0 0 0  Afraid - awful might happen 0 0 0 0  Total GAD 7 Score 0 0 0 0  Anxiety Difficulty Not difficult at all Not difficult at all Not difficult at all Not difficult at all      Assessment/ Plan: 63 y.o. female   Degeneration of intervertebral disc of lumbar region with discogenic back pain - Plan: oxyCODONE -acetaminophen  (PERCOCET) 10-325 MG tablet, oxyCODONE -acetaminophen  (PERCOCET) 10-325 MG tablet, oxyCODONE -acetaminophen  (PERCOCET) 10-325 MG tablet  Post-traumatic osteoarthritis of left knee - Plan: oxyCODONE -acetaminophen  (PERCOCET) 10-325 MG tablet, oxyCODONE -acetaminophen  (PERCOCET) 10-325 MG tablet, oxyCODONE -acetaminophen  (PERCOCET) 10-325 MG tablet  Chronic right-sided low back pain without sciatica - Plan: oxyCODONE -acetaminophen  (PERCOCET) 10-325 MG tablet, oxyCODONE -acetaminophen  (PERCOCET) 10-325 MG tablet, oxyCODONE -acetaminophen  (PERCOCET) 10-325 MG tablet  Stress due to illness of family member   Pain is stable and controlled moderately Medications have been renewed and UDS and CSA are up-to-date as per office policy.  The national narcotic database reviewed with no red flags  Her biggest issue today and what we spent most of her time on was the ongoing's with her mother, who is under hospice care and of course the chaos with her daughter and grandson.  I encouraged her to contact me prior to our next 22-month  interval checkup should she need further interventions from a mental health standpoint but I highly encouraged that she take time for herself and really consider seeing that pastor for therapy.  She has a lot of on dealt with and likely PTSD issues related to the suicide of her brother when she was younger to.   Norene CHRISTELLA Fielding, DO [facility] [phone]

## 2024-12-20 ENCOUNTER — Telehealth (INDEPENDENT_AMBULATORY_CARE_PROVIDER_SITE_OTHER): Admitting: Family Medicine

## 2024-12-20 ENCOUNTER — Encounter: Payer: Self-pay | Admitting: Family Medicine

## 2024-12-20 ENCOUNTER — Ambulatory Visit: Payer: Self-pay

## 2024-12-20 DIAGNOSIS — J0100 Acute maxillary sinusitis, unspecified: Secondary | ICD-10-CM

## 2024-12-20 MED ORDER — LEVOFLOXACIN 500 MG PO TABS: 500.0000 mg | ORAL_TABLET | Freq: Every day | ORAL | 0 refills | Status: AC

## 2024-12-20 MED ORDER — PSEUDOEPHEDRINE-GUAIFENESIN ER 120-1200 MG PO TB12: 1.0000 | ORAL_TABLET | Freq: Two times a day (BID) | ORAL | 1 refills | Status: DC

## 2024-12-20 NOTE — Telephone Encounter (Signed)
 FYI Only or Action Required?: FYI only for provider: telehealth today.  Patient was last seen in primary care on 10/06/2024 by Jolinda Norene HERO, DO.  Called Nurse Triage reporting Sinusitis.  Symptoms began several days ago.  Interventions attempted: Rest, hydration, or home remedies.  Symptoms are: unchanged.  Triage Disposition: See HCP Within 4 Hours (Or PCP Triage)  Patient/caregiver understands and will follow disposition?:   Copied from CRM #[id number]. Topic: Clinical - Medical Advice >> Dec 20, 2024  8:10 AM Diannia H wrote: Reason for CRM: Patient is wanting to know can the provider call her in some type of antibiotics for her sinus. She is puffing under her eyes and around her nose. She feels okay minus the drainage. Could you assist? Callback number is [phone]  [facility] [place], [place] - [address] [place] [address] Phone: [phone] Fax: [phone] Hours: Not open 24 hours Reason for Disposition  [1] Redness or swelling on the cheek, forehead or around the eye AND [2] no fever  Answer Assessment - Initial Assessment Questions Pt reports nasal and sinus congestion with pressure and edema under her eyes and blood on the tissues. States hx of bronchitis, denies chronic sinusitis. Denies fever. Neg covid/flu. Requested virtual.   1. LOCATION: Where does it hurt?      Under eyes 2. ONSET: When did the sinus pain start?  (e.g., hours, days)      12/15/24 4. RECURRENT SYMPTOM: Have you ever had sinus problems before? If Yes, ask: When was the last time? and What happened that time?      Denies 5. NASAL CONGESTION:     Yes 6. NASAL DISCHARGE: Do you have discharge from your nose? If so ask, What color?     Yes, with occ blood 7. FEVER: Do you have a fever? If Yes, ask: What is it, how was it measured, and when did it start?      Denies 8. OTHER SYMPTOMS: Do you have any other symptoms? (e.g., sore throat,  cough, earache, difficulty breathing)     Denies  Protocols used: Sinus Pain or Congestion-A-AH

## 2024-12-20 NOTE — Telephone Encounter (Signed)
Noted  -LS

## 2024-12-20 NOTE — Progress Notes (Signed)
 "   Subjective:    Patient ID: Ashley Marquez, female    DOB: 09/26/1961, 64 y.o.   MRN: [id number]   HPI: Ashley Marquez is a 64 y.o. female presenting for   Discussed the use of AI scribe software for clinical note transcription with the patient, who gave verbal consent to proceed.  History of Present Illness Ashley Marquez is a 64 year old female who presents with sinus symptoms including nasal bleeding and puffiness under the eyes.  She experiences puffiness under both eyes and slight epistaxis. She reports drainage running down the back of her throat and pressure under her eyes. No yellow or green nasal discharge, cough, body aches, chills, fever, sore throat, or earache.  She has not been using any nasal sprays prior to this visit. She mentions using [employer] for heat, which may contribute to nasal irritation. She is allergic to penicillin.        07/05/2024    8:15 AM 03/29/2024    2:12 PM 12/20/2023    3:24 PM 09/13/2023    3:57 PM 07/26/2023   10:44 AM  Depression screen PHQ 2/9  Decreased Interest 0 0 0 0 0  Down, Depressed, Hopeless 0 0 0 0 0  PHQ - 2 Score 0 0 0 0 0  Altered sleeping  0 0 0 0  Tired, decreased energy  0 0 0 0  Change in appetite  0 0 0 0  Feeling bad or failure about yourself   0 0 0 0  Trouble concentrating  0 0 0 0  Moving slowly or fidgety/restless  0 0 0 0  Suicidal thoughts  0 0 0 0  PHQ-9 Score  0  0  0  0   Difficult doing work/chores  Not difficult at all Not difficult at all Not difficult at all Not difficult at all     Data saved with a previous flowsheet row definition     Relevant past medical, surgical, family and social history reviewed and updated as indicated.  Interim medical history since our last visit reviewed. Allergies and medications reviewed and updated.  ROS:  Review of Systems  Constitutional: Negative.   HENT:  Positive for congestion, postnasal drip and rhinorrhea.   Eyes:  Negative for visual disturbance.   Respiratory:  Positive for choking. Negative for shortness of breath.   Cardiovascular:  Negative for chest pain.  Gastrointestinal:  Negative for abdominal pain.  Musculoskeletal:  Negative for arthralgias.     Tobacco Use History[1]     Objective:     Wt Readings from Last 3 Encounters:  10/06/24 151 lb 8 oz (68.7 kg)  07/24/24 150 lb (68 kg)  07/05/24 147 lb 12.8 oz (67 kg)     Exam deferred. Video visit performed.   Assessment & Plan:  Acute maxillary sinusitis, recurrence not specified  Other orders -     levoFLOXacin ; Take 1 tablet (500 mg total) by mouth daily.  Dispense: 10 tablet; Refill: 0 -     Pseudoephedrine -guaiFENesin  ER; Take 1 tablet by mouth 2 (two) times daily. For congestion  Dispense: 14 tablet; Refill: 1      Diagnoses and all orders for this visit:  Acute maxillary sinusitis, recurrence not specified  Other orders -     levofloxacin  (LEVAQUIN ) 500 MG tablet; Take 1 tablet (500 mg total) by mouth daily. -     Pseudoephedrine -Guaifenesin  [phone] MG TB12; Take 1 tablet by mouth 2 (two) times daily.  For congestion    Virtual Visit  Note  I discussed the limitations, risks, security and privacy concerns of performing an evaluation and management service by video and the availability of in person appointments. The patient was identified with two identifiers. Pt.expressed understanding and agreed to proceed. Pt. Is at home. Dr. Zollie is in his office.  Follow Up Instructions:   I discussed the assessment and treatment plan with the patient. The patient was provided an opportunity to ask questions and all were answered. The patient agreed with the plan and demonstrated an understanding of the instructions.   The patient was advised to call back or seek an in-person evaluation if the symptoms worsen or if the condition fails to improve as anticipated.   Follow up plan: Return if symptoms worsen or fail to improve.  Butler Zollie, MD [place]  [facility]     [1]  Social History Tobacco Use  Smoking Status Never  Smokeless Tobacco Never   "

## 2024-12-26 ENCOUNTER — Telehealth: Payer: Self-pay

## 2024-12-26 NOTE — Telephone Encounter (Signed)
 Copied from CRM #[id number]. Topic: Clinical - Medication Refill >> Dec 26, 2024 11:20 AM Graeme ORN wrote: Medication: oxyCODONE -acetaminophen  (PERCOCET) 10-325 MG tablet request 3 month appt - no opening until March will run out of medication on 2/5 - would like to come tomorrow if appt needed  Has the patient contacted their pharmacy? No (Agent: If no, request that the patient contact the pharmacy for the refill. If patient does not wish to contact the pharmacy document the reason why and proceed with request.) (Agent: If yes, when and what did the pharmacy advise?)  This is the patient's preferred pharmacy:  [facility], [place] - [address] [place] [address] Phone: [phone] Fax: [phone]  Is this the correct pharmacy for this prescription? No If no, delete pharmacy and type the correct one.   Has the prescription been filled recently? Yes  Is the patient out of the medication? No - enough until the 5th  Has the patient been seen for an appointment in the last year OR does the patient have an upcoming appointment? Yes - request 3 month appt - no opening until March will run out of medication on 2/5 - would like to come tomorrow if appt needed  Can we respond through MyChart? Yes  Agent: Please be advised that Rx refills may take up to 3 business days. We ask that you follow-up with your pharmacy.   ----------------------------------------------------------------------- From previous Reason for Contact - Appt Info/Confirm: Patient/patient representative is calling for information regarding an appointment.

## 2024-12-27 ENCOUNTER — Ambulatory Visit: Admitting: Family Medicine

## 2024-12-27 NOTE — Telephone Encounter (Signed)
 Pt scheduled tomorrow and pt aware.

## 2024-12-27 NOTE — Telephone Encounter (Signed)
 Please let her know I had a 10am and I put her in it today

## 2024-12-28 ENCOUNTER — Ambulatory Visit: Admitting: Family Medicine

## 2024-12-28 VITALS — BP 159/78 | HR 76 | Temp 97.4°F | Ht 65.0 in | Wt 154.2 lb

## 2024-12-28 DIAGNOSIS — G8929 Other chronic pain: Secondary | ICD-10-CM | POA: Diagnosis not present

## 2024-12-28 DIAGNOSIS — Z79899 Other long term (current) drug therapy: Secondary | ICD-10-CM | POA: Diagnosis not present

## 2024-12-28 DIAGNOSIS — M1732 Unilateral post-traumatic osteoarthritis, left knee: Secondary | ICD-10-CM

## 2024-12-28 DIAGNOSIS — M5136 Other intervertebral disc degeneration, lumbar region with discogenic back pain only: Secondary | ICD-10-CM

## 2024-12-28 MED ORDER — OXYCODONE-ACETAMINOPHEN 10-325 MG PO TABS: 0.5000 | ORAL_TABLET | Freq: Three times a day (TID) | ORAL | 0 refills | Status: AC | PRN

## 2024-12-28 NOTE — Progress Notes (Signed)
 "  Subjective: CC: Chronic pain PCP: Jolinda Norene HERO, DO Ashley Marquez is a 64 y.o. female presenting to clinic today for:  DDD of lumbar region Patient reports use of opioid as directed.  She voices no concerning side effects including excessive daytime sedation, constipation etc.   ROS: Per HPI  Allergies[1] Past Medical History:  Diagnosis Date   Anxiety    Atrial fibrillation with RVR (HCC) 06/01/2023   Chest pain 06/08/2011   IMO SNOMED Dx Update Oct 2024     Heart murmur    Current Medications[2] Social History   Socioeconomic History   Marital status: Divorced    Spouse name: Not on file   Number of children: 2   Years of education: Not on file   Highest education level: Not on file  Occupational History    Comment: [employer]  Tobacco Use   Smoking status: Never   Smokeless tobacco: Never  Vaping Use   Vaping status: Never Used  Substance and Sexual Activity   Alcohol use: No   Drug use: No   Sexual activity: Not Currently  Other Topics Concern   Not on file  Social History Narrative   Not on file   Social Drivers of Health   Tobacco Use: Low Risk (12/20/2024)   Patient History    Smoking Tobacco Use: Never    Smokeless Tobacco Use: Never    Passive Exposure: Not on file  Financial Resource Strain: Not on file  Food Insecurity: No Food Insecurity (03/17/2024)   Received from [facility]   Epic    Within the past 12 months, you worried that your food would run out before you got the money to buy more.: Never true    Within the past 12 months, the food you bought just didn't last and you didn't have money to get more.: Never true  Transportation Needs: No Transportation Needs (03/17/2024)   Received from [facility]   PRAPARE - Transportation    Lack of Transportation (Medical): No    Lack of Transportation (Non-Medical): No  Physical Activity: Not on file  Stress: Not on file  Social Connections: Not on file  Intimate Partner  Violence: Not At Risk (06/01/2023)   Humiliation, Afraid, Rape, and Kick questionnaire    Fear of Current or Ex-Partner: No    Emotionally Abused: No    Physically Abused: No    Sexually Abused: No  Depression (PHQ2-9): Low Risk (07/05/2024)   Depression (PHQ2-9)    PHQ-2 Score: 0  Alcohol Screen: Not on file  Housing: Low Risk (06/01/2023)   Housing    Last Housing Risk Score: 0  Utilities: Low Risk (03/17/2024)   Received from [facility]   Utilities    Within the past 12 months, have you been unable to get utilities(heat, electricity) when it was really needed?: No  Health Literacy: Not on file   Family History  Problem Relation Age of Onset   Deep vein thrombosis Mother    Emphysema Father 37    Objective: Office vital signs reviewed. BP (!) 159/78   Pulse 76   Temp (!) 97.4 F (36.3 C)   Ht 5' 5 (1.651 m)   Wt 154 lb 3.2 oz (69.9 kg)   SpO2 98%   BMI 25.66 kg/m   Physical Examination:  General: Awake, alert, well nourished, No acute distress HEENT: Sclera [place].  Moist mucous membranes Cardio: regular rate and rhythm, S1S2 heard, no murmurs appreciated  Pulm: clear to auscultation bilaterally, no wheezes, rhonchi or rales; normal work of breathing on room air MSK: Ambulating independently with normal gait and station.  Assessment/ Plan: 64 y.o. female   Degeneration of intervertebral disc of lumbar region with discogenic back pain - Plan: oxyCODONE -acetaminophen  (PERCOCET) 10-325 MG tablet, oxyCODONE -acetaminophen  (PERCOCET) 10-325 MG tablet, oxyCODONE -acetaminophen  (PERCOCET) 10-325 MG tablet, ToxASSURE Select 13 (MW), Urine  Post-traumatic osteoarthritis of left knee - Plan: oxyCODONE -acetaminophen  (PERCOCET) 10-325 MG tablet, oxyCODONE -acetaminophen  (PERCOCET) 10-325 MG tablet, oxyCODONE -acetaminophen  (PERCOCET) 10-325 MG tablet, ToxASSURE Select 13 (MW), Urine  Chronic right-sided low back pain without sciatica - Plan: oxyCODONE -acetaminophen  (PERCOCET) 10-325  MG tablet, oxyCODONE -acetaminophen  (PERCOCET) 10-325 MG tablet, oxyCODONE -acetaminophen  (PERCOCET) 10-325 MG tablet, ToxASSURE Select 13 (MW), Urine  Controlled substance agreement signed - Plan: ToxASSURE Select 13 (MW), Urine   UDS and CSA were updated as per office policy today.  National narcotic database reviewed with no red flags.  Postdated prescription sent to pharmacy.  Will see each other back in 3 months, sooner if concerns arise   Norene CHRISTELLA Fielding, DO [facility] [phone]     [1]  Allergies Allergen Reactions   Gabapentin      hallucination   Penicillins Nausea And Vomiting and Other (See Comments)    Patient slept for 2 days.  Has patient had a PCN reaction causing immediate rash, facial/tongue/throat swelling, SOB or lightheadedness with hypotension: No Has patient had a PCN reaction causing severe rash involving mucus membranes or skin necrosis: No Has patient had a PCN reaction that required hospitalization No Has patient had a PCN reaction occurring within the last 10 years: No If all of the above answers are NO, then may proceed with Cephalosporin use.   [2]  Current Outpatient Medications:    albuterol  (VENTOLIN  HFA) 108 (90 Base) MCG/ACT inhaler, Inhale 2 puffs into the lungs every 6 (six) hours as needed for wheezing or shortness of breath., Disp: 8 g, Rfl: 0   diltiazem  (CARDIZEM  CD) 180 MG 24 hr capsule, Take 1 capsule (180 mg total) by mouth daily., Disp: 90 capsule, Rfl: 1   levofloxacin  (LEVAQUIN ) 500 MG tablet, Take 1 tablet (500 mg total) by mouth daily., Disp: 10 tablet, Rfl: 0   omeprazole  (PRILOSEC) 20 MG capsule, Take 1 capsule (20 mg total) by mouth daily., Disp: 90 capsule, Rfl: 3   oxyCODONE -acetaminophen  (PERCOCET) 10-325 MG tablet, Take 0.5-1 tablets by mouth every 8 (eight) hours as needed for pain., Disp: 75 tablet, Rfl: 0   oxyCODONE -acetaminophen  (PERCOCET) 10-325 MG tablet, Take 0.5-1 tablets by mouth every 8  (eight) hours as needed for pain., Disp: 75 tablet, Rfl: 0   oxyCODONE -acetaminophen  (PERCOCET) 10-325 MG tablet, Take 0.5-1 tablets by mouth every 8 (eight) hours as needed for pain., Disp: 75 tablet, Rfl: 0   Pseudoephedrine -Guaifenesin  [phone] MG TB12, Take 1 tablet by mouth 2 (two) times daily. For congestion, Disp: 14 tablet, Rfl: 1   rosuvastatin  (CRESTOR ) 10 MG tablet, Take 1 tablet (10 mg total) by mouth daily., Disp: 90 tablet, Rfl: 3   triamcinolone  cream (KENALOG ) 0.1 %, Apply 1 Application topically 2 (two) times daily as needed (poison oak). (Patient not taking: Reported on 10/06/2024), Disp: 80 g, Rfl: 0   Vitamin D , Ergocalciferol , (DRISDOL ) 1.25 MG (50000 UNIT) CAPS capsule, Take 1 capsule (50,000 Units total) by mouth every 7 (seven) days., Disp: 12 capsule, Rfl: 3  "

## 2025-01-03 ENCOUNTER — Ambulatory Visit: Payer: Self-pay | Admitting: Family Medicine

## 2025-01-03 LAB — TOXASSURE SELECT 13 (MW), URINE

## 2025-07-09 ENCOUNTER — Encounter: Payer: Self-pay | Admitting: Family Medicine

## 2025-07-24 ENCOUNTER — Ambulatory Visit: Admitting: Nurse Practitioner
